# Patient Record
Sex: Male | Born: 1950 | Race: White | Hispanic: No | Marital: Single | Smoking: Never smoker
Health system: Southern US, Community
[De-identification: ages and names within clinical notes are randomized; demographics above are authoritative.]

## PROBLEM LIST (undated history)

## (undated) DIAGNOSIS — R011 Cardiac murmur, unspecified: Secondary | ICD-10-CM

## (undated) DIAGNOSIS — E43 Unspecified severe protein-calorie malnutrition: Secondary | ICD-10-CM

## (undated) DIAGNOSIS — C189 Malignant neoplasm of colon, unspecified: Secondary | ICD-10-CM

## (undated) DIAGNOSIS — D509 Iron deficiency anemia, unspecified: Secondary | ICD-10-CM

## (undated) DIAGNOSIS — Z933 Colostomy status: Secondary | ICD-10-CM

## (undated) DIAGNOSIS — G629 Polyneuropathy, unspecified: Secondary | ICD-10-CM

## (undated) HISTORY — PX: APPENDECTOMY: SHX54

## (undated) HISTORY — PX: OSTOMY: SHX5997

---

## 2009-07-22 ENCOUNTER — Ambulatory Visit: Payer: Self-pay | Admitting: Oncology

## 2009-08-09 ENCOUNTER — Encounter: Payer: Self-pay | Admitting: Gastroenterology

## 2009-08-09 ENCOUNTER — Inpatient Hospital Stay: Payer: Self-pay | Admitting: Unknown Physician Specialty

## 2009-08-10 ENCOUNTER — Encounter: Payer: Self-pay | Admitting: Gastroenterology

## 2009-08-11 ENCOUNTER — Encounter: Payer: Self-pay | Admitting: Gastroenterology

## 2009-08-11 ENCOUNTER — Telehealth (INDEPENDENT_AMBULATORY_CARE_PROVIDER_SITE_OTHER): Payer: Self-pay | Admitting: *Deleted

## 2009-08-11 DIAGNOSIS — R198 Other specified symptoms and signs involving the digestive system and abdomen: Secondary | ICD-10-CM | POA: Insufficient documentation

## 2009-08-14 ENCOUNTER — Ambulatory Visit: Payer: Self-pay | Admitting: Oncology

## 2009-08-17 ENCOUNTER — Ambulatory Visit: Payer: Self-pay | Admitting: Gastroenterology

## 2009-08-17 ENCOUNTER — Ambulatory Visit (HOSPITAL_COMMUNITY): Admission: RE | Admit: 2009-08-17 | Discharge: 2009-08-17 | Payer: Self-pay | Admitting: Gastroenterology

## 2009-08-22 ENCOUNTER — Ambulatory Visit: Payer: Self-pay | Admitting: Oncology

## 2009-08-25 ENCOUNTER — Ambulatory Visit: Payer: Self-pay | Admitting: Surgery

## 2009-09-19 ENCOUNTER — Ambulatory Visit: Payer: Self-pay | Admitting: Oncology

## 2009-10-20 ENCOUNTER — Ambulatory Visit: Payer: Self-pay | Admitting: Oncology

## 2009-11-08 ENCOUNTER — Ambulatory Visit: Payer: Self-pay | Admitting: Surgery

## 2009-11-20 ENCOUNTER — Inpatient Hospital Stay: Payer: Self-pay | Admitting: Surgery

## 2010-01-19 ENCOUNTER — Ambulatory Visit: Payer: Self-pay | Admitting: Internal Medicine

## 2010-02-16 ENCOUNTER — Ambulatory Visit: Payer: Self-pay | Admitting: Internal Medicine

## 2010-02-18 LAB — CEA: CEA: 2.1 ng/mL (ref 0.0–4.7)

## 2010-02-19 ENCOUNTER — Ambulatory Visit: Payer: Self-pay | Admitting: Internal Medicine

## 2010-03-22 ENCOUNTER — Ambulatory Visit: Payer: Self-pay | Admitting: Internal Medicine

## 2010-04-21 ENCOUNTER — Ambulatory Visit: Payer: Self-pay | Admitting: Internal Medicine

## 2010-05-22 ENCOUNTER — Ambulatory Visit: Payer: Self-pay | Admitting: Internal Medicine

## 2010-06-21 ENCOUNTER — Ambulatory Visit: Payer: Self-pay | Admitting: Internal Medicine

## 2010-07-22 ENCOUNTER — Ambulatory Visit: Payer: Self-pay | Admitting: Internal Medicine

## 2010-07-26 IMAGING — CR DG CHEST 1V PORT
1 series · 1 of 1 positions shown · non-contrast
Comparison: none

REASON FOR EXAM: port of cath
COMMENTS:

[view not recorded]
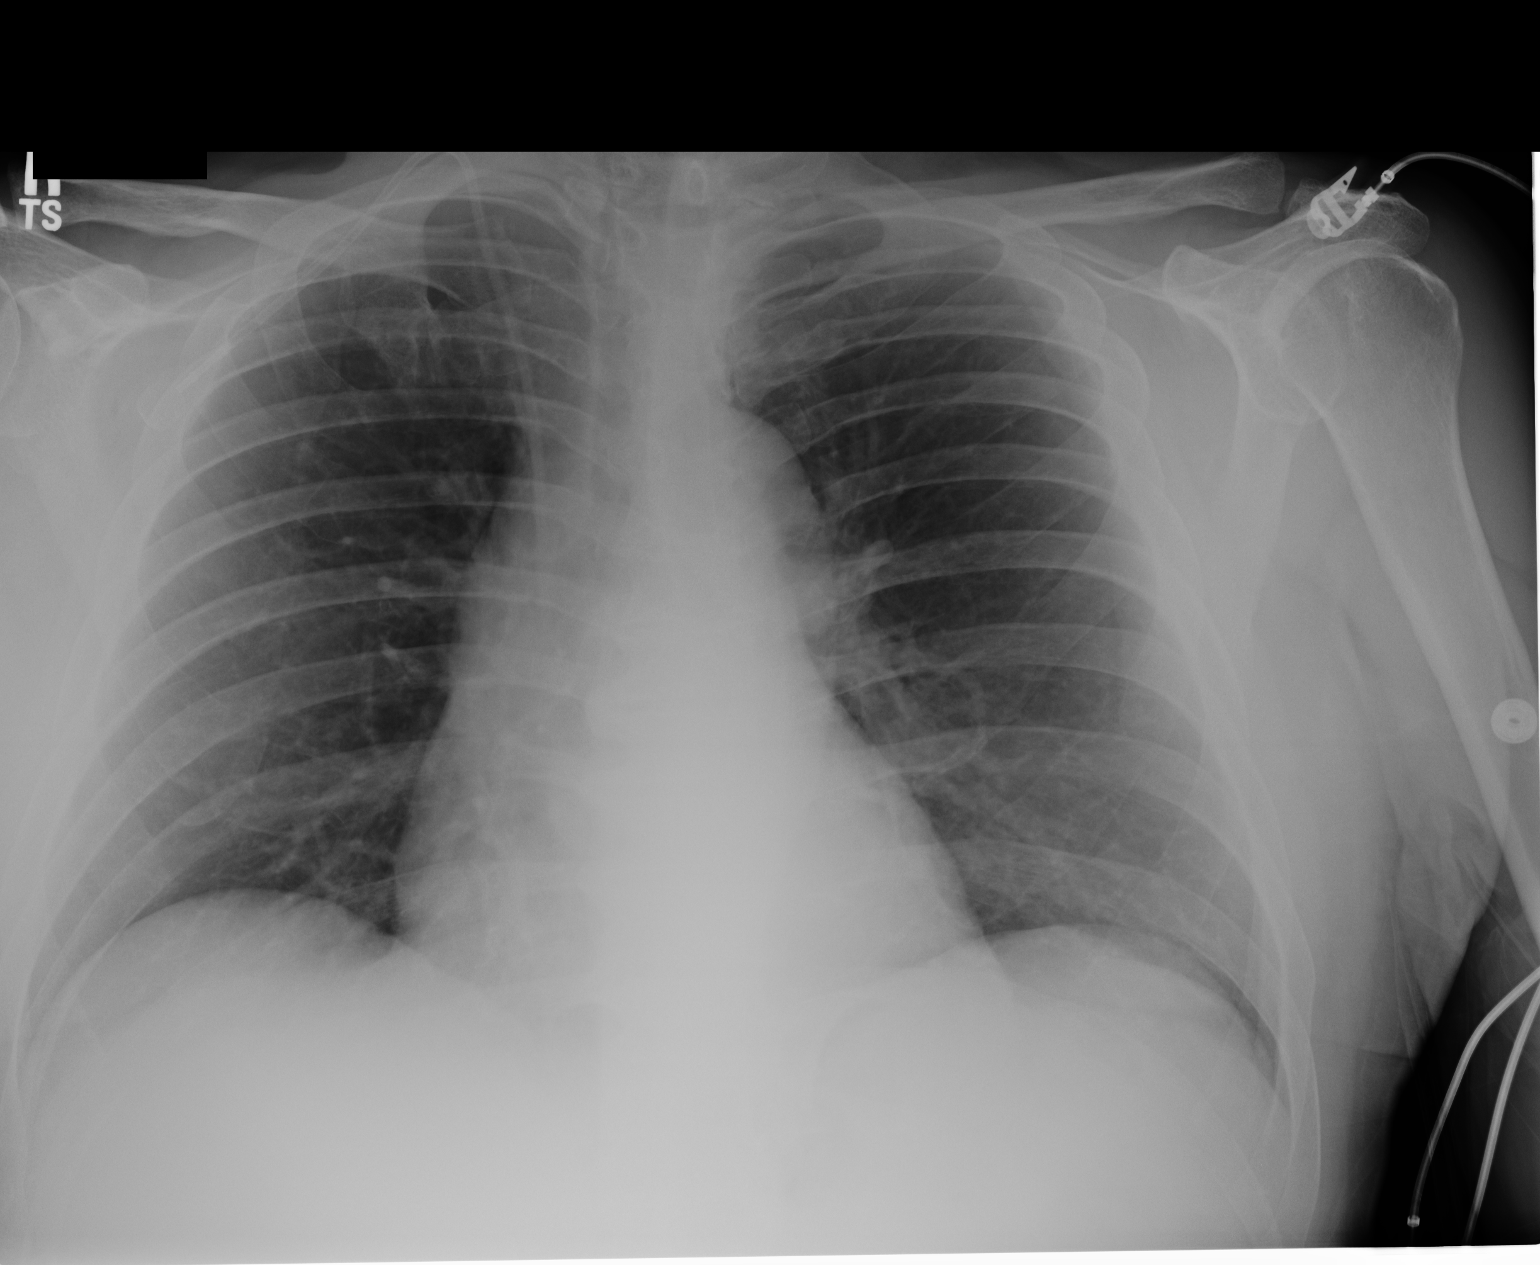

[1 of 1 positions shown; findings below may reference images not displayed]

PROCEDURE:     DXR - DXR PORTABLE CHEST SINGLE VIEW  - August 25, 2009 [DATE]

RESULT:     Comparison is made to the study of 08/09/2009.

A right central venous catheter is present with the tip in the superior vena
cava. There is no pneumothorax. The lungs are clear. The cardiac silhouette
is normal. Monitoring electrodes are present.
IMPRESSION: Right sided central venous catheter present with the tip in
the superior vena cava. No complication evident.

## 2010-08-22 ENCOUNTER — Ambulatory Visit: Payer: Self-pay | Admitting: Internal Medicine

## 2010-08-23 NOTE — Letter (Signed)
Summary: [facility]  [address] [place], [place] [address]   Phone: [phone]  Fax: [phone]       DAWSYN RAMSARAN    1950/10/24    MRN: [id number]        Procedure Day /Date:08/17/09     Arrival Time:1030 am     Procedure Time:1130 am     Location of Procedure:                     X  [facility] ( Outpatient Registration)    PREPARATION FOR FLEXIBLE SIGMOIDOSCOPY WITH MAGNESIUM CITRATE  Prior to the day before your procedure, purchase one 8 oz. bottle of Magnesium Citrate and one Fleet Enema from the laxative section of your drugstore.  _________________________________________________________________________________________________  THE DAY BEFORE YOUR PROCEDURE             DATE: 08/16/09     DAY: WED  1.   Have a clear liquid dinner the night before your procedure.  2.   Do not drink anything colored red or purple.  Avoid juices with pulp.  No orange juice.              CLEAR LIQUIDS INCLUDE: Water Jello Ice Popsicles Tea (sugar ok, no milk/cream) Powdered fruit flavored drinks Coffee (sugar ok, no milk/cream) Gatorade Juice: apple, white grape, white cranberry  Lemonade Clear bullion, consomm, broth Carbonated beverages (any kind) Strained chicken noodle soup Hard Candy   3.   At 7:00 pm the night before your procedure, drink one bottle of Magnesium Citrate over ice.  4.   Drink at least 3 more glasses of clear liquids before bedtime (preferably juices).  5.   Results are expected usually within 1 to 6 hours after taking the Magnesium Citrate.  ___________________________________________________________________________________________________  THE DAY OF YOUR PROCEDURE            DATE: 08/17/09     DAY: THUR  1.   Use Fleet Enema one hour prior to coming for procedure.  2.   You may drink clear liquids until 730 am (4 hours before exam)       MEDICATION INSTRUCTIONS  Unless otherwise  instructed, you should take regular prescription medications with a small sip of water as early as possible the morning of your procedure.         OTHER INSTRUCTIONS  You will need a responsible adult at least 60 years of age to accompany you and drive you home.   This person must remain in the waiting room during your procedure.  Wear loose fitting clothing that is easily removed.  Leave jewelry and other valuables at home.  However, you may wish to bring a book to read or an iPod/MP3 player to listen to music as you wait for your procedure to start.  Remove all body piercing jewelry and leave at home.  Total time from sign-in until discharge is approximately 2-3 hours.  You should go home directly after your procedure and rest.  You can resume normal activities the day after your procedure.  The day of your procedure you should not:   Drive   Make legal decisions   Operate machinery   Drink alcohol   Return to work  You will receive specific instructions about eating, activities and medications before you leave.   The above instructions have been reviewed and explained to me by   Chales Abrahams CMA Duncan Dull)  August 11, 2009  3:54 PM     I fully understand and can verbalize these instructions faxed to Dr Paula Compton office Herbert Seta will give to pt on Monday's appt.  Date 08/11/09

## 2010-08-23 NOTE — Letter (Signed)
Summary: Consult/[facility]  [facility]   Imported By: Sherian Rein 08/18/2009 07:47:42  _____________________________________________________________________  External Attachment:    Type:   Image     Comment:   External Document

## 2010-08-23 NOTE — Progress Notes (Signed)
Summary: EUS  Phone Note Outgoing Call Call back at [facility] Phone [phone]   Call placed by: Chales Abrahams CMA Duncan Dull),  August 11, 2009 3:48 PM Summary of Call: called and gave heather the appt date and time for pt she will give the instructions to the pt at his appt with dr gittin on monday.  I will fax the instructions to her. Initial call taken by: Chales Abrahams CMA Duncan Dull),  August 11, 2009 3:52 PM  New Problems: RECTAL MASS (ICD-787.99)   New Problems: RECTAL MASS (ICD-787.99)

## 2010-08-23 NOTE — Procedures (Signed)
Summary: Colonoscopy/[facility]  [facility]   Imported By: Sherian Rein 08/18/2009 07:50:55  _____________________________________________________________________  External Attachment:    Type:   Image     Comment:   External Document

## 2010-08-23 NOTE — Procedures (Signed)
Summary: Endoscopic Ultrasound  Patient: Octavia Mottola Note: All result statuses are Final unless otherwise noted.  Tests: (1) Endoscopic Ultrasound (EUS)  EUS Endoscopic Ultrasound                             DONE     [facility]     [address] [place], [place]  [address]           ENDOSCOPIC ULTRASOUND PROCEDURE REPORT           PATIENT:  Elizandro, Laura  MR#:  [id number]     BIRTHDATE:  [birth year]  GENDER:  male           ENDOSCOPIST:  Rachael Fee, MD     Referred by:  [name], MD at [facility]           PROCEDURE DATE:  08/17/2009     PROCEDURE:  Lower EUS     ASA CLASS:  Class II     INDICATIONS:  newly diagnosed rectal adenocarcinoma (colonoscopy     by Dr. Lynnae Prude).  Imaging shows no clear metastatic disease                 MEDICATIONS:   Fentanyl 50 mcg IV, Versed 5 mg IV           DESCRIPTION OF PROCEDURE:   After the risks, benefits, and     alternatives of the procedure were thoroughly explained, informed     consent was obtained.  The  endoscope was introduced through the     and advanced to the .     <<PROCEDUREIMAGES>>           Sigmoidoscopic findings:     1. Circumferential, clearly malignant, ulcerted, fungating,     friable mass in rectum. The mass is 6cm long and the distal edge     of the mass is 5-6cm from anal verge.           EUS findings:     1. The mass described above corresponds with an irregularly     bordered, heterogneous, hypoechoic lesion that is 1.3cm thick     maximally and clearly passes into and through the muscularis     propria layer of the rectal wall (uT3).     2. There were two small, but round, hypoechoic, homogeneous,     discrete perirectal lymphnodes that are suspicious for malignant     involvement (uN1).           Impression:     6cm long, circumferential uT3N1 rectal adenocarcinoma with distal     edge 5-6cm from anal verge.  I spoke with Dr. [name] about these  finding, we agree that the patient will likely benefit from     neoadjuvant chemo/xrt.     I will leave follow up (post treatment) surveillance colonoscopies     to Dr. Lynnae Prude who initially diagnosed the rectal cancer     earlier this month.           ______________________________     Rachael Fee, MD           cc: Gerarda Fraction, MD; Lynnae Prude, MD           n.     eSIGNED:   Rachael Fee at 08/17/2009 11:44 AM  Tykel, Badie, [id number]  Note: An exclamation mark (!) indicates a result that was not dispersed into the flowsheet. Document Creation Date: 08/17/2009 11:45 AM _______________________________________________________________________  (1) Order result status: Final Collection or observation date-time: 08/17/2009 11:33 Requested date-time:  Receipt date-time:  Reported date-time:  Referring Physician:   Ordering Physician: Rob Bunting [phone]) Specimen Source:  Source: Launa Grill Order Number: [id number] Lab site:

## 2010-08-23 NOTE — Letter (Signed)
Summary: Physician [facility]  [facility]   Imported By: Sherian Rein 08/18/2009 07:45:52  _____________________________________________________________________  External Attachment:    Type:   Image     Comment:   External Document

## 2010-08-23 NOTE — Letter (Signed)
Summary: Brief Consult/[facility]  [facility]   Imported By: Sherian Rein 08/18/2009 07:49:58  _____________________________________________________________________  External Attachment:    Type:   Image     Comment:   External Document

## 2010-09-20 ENCOUNTER — Ambulatory Visit: Payer: Self-pay | Admitting: Internal Medicine

## 2010-10-12 LAB — CEA: CEA: 2.8 ng/mL (ref 0.0–4.7)

## 2010-10-21 ENCOUNTER — Ambulatory Visit: Payer: Self-pay | Admitting: Internal Medicine

## 2011-11-03 IMAGING — CR DG CHEST 2V
1 series · 2 of 2 positions shown · non-contrast
Comparison: none

REASON FOR EXAM: rectal mass, severe anemia, b12 def., iron def., rectal
bleeding
COMMENTS:

[Series 1: view not recorded · 0.17mm/px · 2 of 2 slices shown]
[im 1/2]
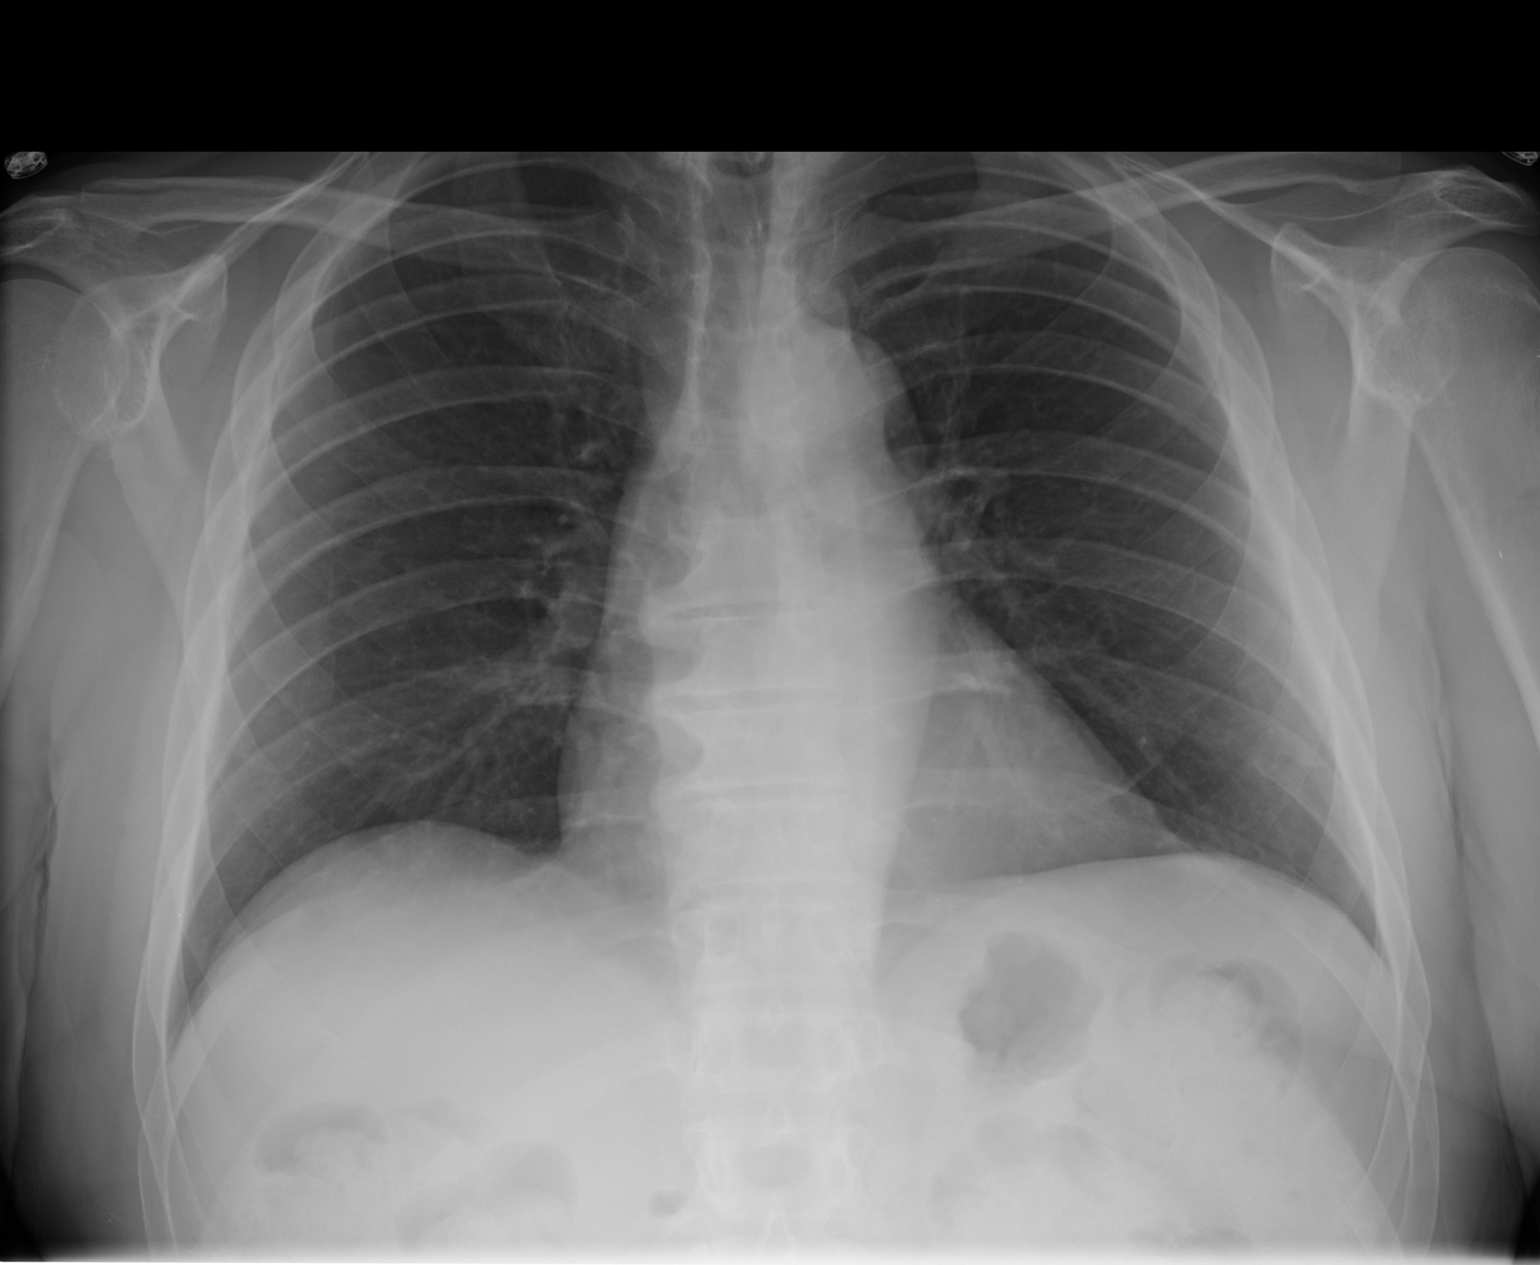
[im 2/2]
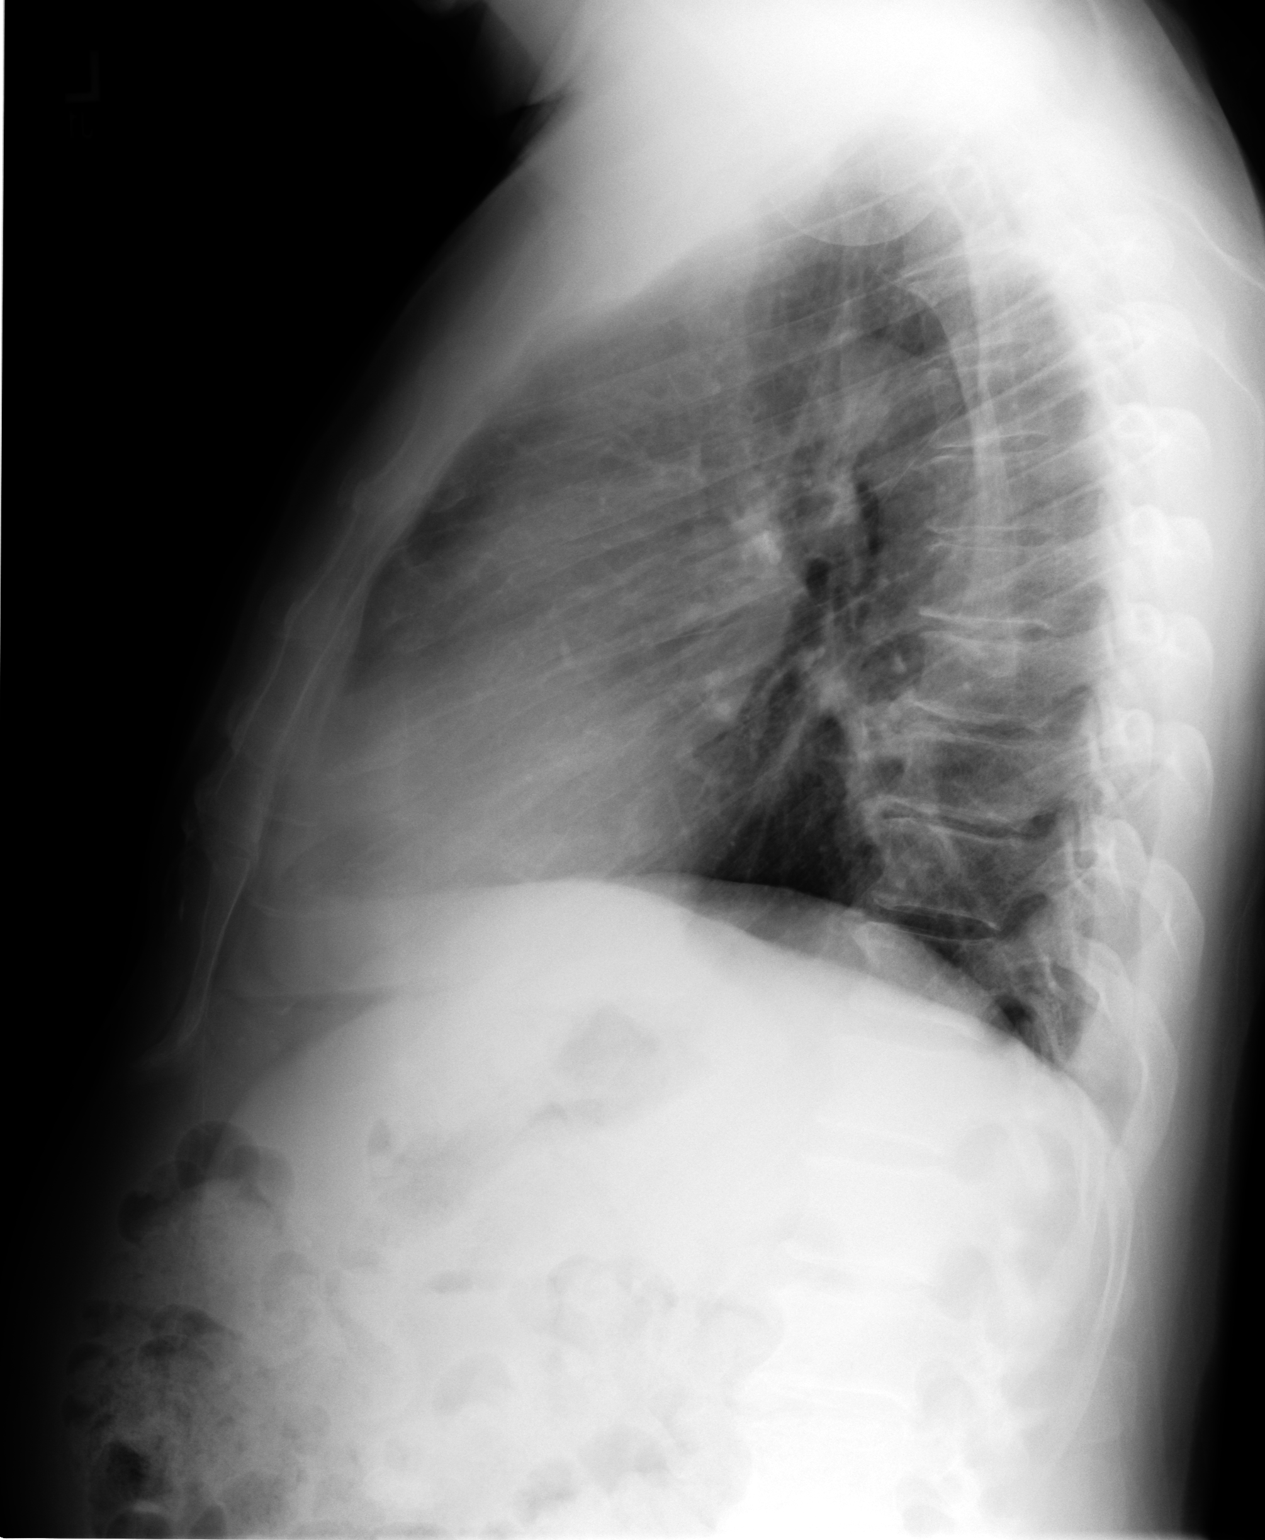

[2 of 2 positions shown; findings below may reference images not displayed]

PROCEDURE:     DXR - DXR CHEST PA (OR AP) AND LATERAL  - August 09, 2009  [DATE]

RESULT:     The lungs are adequately inflated and clear. The heart is normal
in size. The pulmonary vascularity is not engorged. There is mild tortuosity
of the descending thoracic aorta. There are degenerative changes of the
thoracic spine.
IMPRESSION: I do not see evidence of metastatic disease nor other acute
cardiopulmonary abnormality.

## 2014-09-05 ENCOUNTER — Ambulatory Visit: Payer: Self-pay | Admitting: Unknown Physician Specialty

## 2014-11-14 LAB — SURGICAL PATHOLOGY

## 2015-09-04 DIAGNOSIS — Z433 Encounter for attention to colostomy: Secondary | ICD-10-CM | POA: Diagnosis not present

## 2015-11-02 DIAGNOSIS — Z433 Encounter for attention to colostomy: Secondary | ICD-10-CM | POA: Diagnosis not present

## 2015-12-22 DIAGNOSIS — Z433 Encounter for attention to colostomy: Secondary | ICD-10-CM | POA: Diagnosis not present

## 2015-12-31 DIAGNOSIS — B029 Zoster without complications: Secondary | ICD-10-CM | POA: Diagnosis not present

## 2016-02-12 DIAGNOSIS — Z433 Encounter for attention to colostomy: Secondary | ICD-10-CM | POA: Diagnosis not present

## 2016-05-07 DIAGNOSIS — Z433 Encounter for attention to colostomy: Secondary | ICD-10-CM | POA: Diagnosis not present

## 2016-08-22 DIAGNOSIS — Z433 Encounter for attention to colostomy: Secondary | ICD-10-CM | POA: Diagnosis not present

## 2016-10-22 DIAGNOSIS — Z433 Encounter for attention to colostomy: Secondary | ICD-10-CM | POA: Diagnosis not present

## 2017-01-20 DIAGNOSIS — Z433 Encounter for attention to colostomy: Secondary | ICD-10-CM | POA: Diagnosis not present

## 2017-02-07 DIAGNOSIS — Z433 Encounter for attention to colostomy: Secondary | ICD-10-CM | POA: Diagnosis not present

## 2017-03-20 DIAGNOSIS — Z433 Encounter for attention to colostomy: Secondary | ICD-10-CM | POA: Diagnosis not present

## 2017-08-05 DIAGNOSIS — Z433 Encounter for attention to colostomy: Secondary | ICD-10-CM | POA: Diagnosis not present

## 2017-10-20 DIAGNOSIS — R112 Nausea with vomiting, unspecified: Secondary | ICD-10-CM | POA: Diagnosis not present

## 2017-10-20 DIAGNOSIS — R6889 Other general symptoms and signs: Secondary | ICD-10-CM | POA: Diagnosis not present

## 2017-10-24 DIAGNOSIS — Z433 Encounter for attention to colostomy: Secondary | ICD-10-CM | POA: Diagnosis not present

## 2018-01-08 DIAGNOSIS — Z433 Encounter for attention to colostomy: Secondary | ICD-10-CM | POA: Diagnosis not present

## 2018-01-23 DIAGNOSIS — Z433 Encounter for attention to colostomy: Secondary | ICD-10-CM | POA: Diagnosis not present

## 2018-06-29 DIAGNOSIS — Z433 Encounter for attention to colostomy: Secondary | ICD-10-CM | POA: Diagnosis not present

## 2018-07-07 DIAGNOSIS — J0141 Acute recurrent pansinusitis: Secondary | ICD-10-CM | POA: Diagnosis not present

## 2018-08-03 DIAGNOSIS — J0141 Acute recurrent pansinusitis: Secondary | ICD-10-CM | POA: Diagnosis not present

## 2018-09-03 DIAGNOSIS — Z433 Encounter for attention to colostomy: Secondary | ICD-10-CM | POA: Diagnosis not present

## 2018-09-03 DIAGNOSIS — J0100 Acute maxillary sinusitis, unspecified: Secondary | ICD-10-CM | POA: Diagnosis not present

## 2018-09-03 DIAGNOSIS — J301 Allergic rhinitis due to pollen: Secondary | ICD-10-CM | POA: Diagnosis not present

## 2018-09-22 DIAGNOSIS — J301 Allergic rhinitis due to pollen: Secondary | ICD-10-CM | POA: Diagnosis not present

## 2018-09-22 DIAGNOSIS — J329 Chronic sinusitis, unspecified: Secondary | ICD-10-CM | POA: Diagnosis not present

## 2018-10-16 DIAGNOSIS — Z433 Encounter for attention to colostomy: Secondary | ICD-10-CM | POA: Diagnosis not present

## 2019-01-01 DIAGNOSIS — Z433 Encounter for attention to colostomy: Secondary | ICD-10-CM | POA: Diagnosis not present

## 2019-02-15 DIAGNOSIS — Z433 Encounter for attention to colostomy: Secondary | ICD-10-CM | POA: Diagnosis not present

## 2019-02-18 DIAGNOSIS — H9211 Otorrhea, right ear: Secondary | ICD-10-CM | POA: Diagnosis not present

## 2019-03-02 DIAGNOSIS — Z433 Encounter for attention to colostomy: Secondary | ICD-10-CM | POA: Diagnosis not present

## 2019-07-01 DIAGNOSIS — Z433 Encounter for attention to colostomy: Secondary | ICD-10-CM | POA: Diagnosis not present

## 2019-08-19 DIAGNOSIS — Z433 Encounter for attention to colostomy: Secondary | ICD-10-CM | POA: Diagnosis not present

## 2019-10-01 DIAGNOSIS — Z933 Colostomy status: Secondary | ICD-10-CM | POA: Diagnosis not present

## 2019-10-22 DIAGNOSIS — Z433 Encounter for attention to colostomy: Secondary | ICD-10-CM | POA: Diagnosis not present

## 2020-01-07 DIAGNOSIS — Z433 Encounter for attention to colostomy: Secondary | ICD-10-CM | POA: Diagnosis not present

## 2020-02-24 DIAGNOSIS — Z433 Encounter for attention to colostomy: Secondary | ICD-10-CM | POA: Diagnosis not present

## 2020-04-05 DIAGNOSIS — Z433 Encounter for attention to colostomy: Secondary | ICD-10-CM | POA: Diagnosis not present

## 2020-04-25 DIAGNOSIS — Z433 Encounter for attention to colostomy: Secondary | ICD-10-CM | POA: Diagnosis not present

## 2020-06-21 DIAGNOSIS — Z433 Encounter for attention to colostomy: Secondary | ICD-10-CM | POA: Diagnosis not present

## 2020-08-01 DIAGNOSIS — J342 Deviated nasal septum: Secondary | ICD-10-CM | POA: Diagnosis not present

## 2020-08-01 DIAGNOSIS — J301 Allergic rhinitis due to pollen: Secondary | ICD-10-CM | POA: Diagnosis not present

## 2020-08-18 DIAGNOSIS — Z433 Encounter for attention to colostomy: Secondary | ICD-10-CM | POA: Diagnosis not present

## 2020-09-14 DIAGNOSIS — Z433 Encounter for attention to colostomy: Secondary | ICD-10-CM | POA: Diagnosis not present

## 2020-10-09 DIAGNOSIS — Z433 Encounter for attention to colostomy: Secondary | ICD-10-CM | POA: Diagnosis not present

## 2020-10-23 DIAGNOSIS — S46011A Strain of muscle(s) and tendon(s) of the rotator cuff of right shoulder, initial encounter: Secondary | ICD-10-CM | POA: Diagnosis not present

## 2020-10-23 DIAGNOSIS — M1711 Unilateral primary osteoarthritis, right knee: Secondary | ICD-10-CM | POA: Diagnosis not present

## 2020-11-06 DIAGNOSIS — M1711 Unilateral primary osteoarthritis, right knee: Secondary | ICD-10-CM | POA: Diagnosis not present

## 2020-11-17 DIAGNOSIS — Z433 Encounter for attention to colostomy: Secondary | ICD-10-CM | POA: Diagnosis not present

## 2020-12-27 DIAGNOSIS — Z433 Encounter for attention to colostomy: Secondary | ICD-10-CM | POA: Diagnosis not present

## 2021-01-30 DIAGNOSIS — U071 COVID-19: Secondary | ICD-10-CM | POA: Diagnosis not present

## 2021-01-30 DIAGNOSIS — R112 Nausea with vomiting, unspecified: Secondary | ICD-10-CM | POA: Diagnosis not present

## 2021-02-01 DIAGNOSIS — Z433 Encounter for attention to colostomy: Secondary | ICD-10-CM | POA: Diagnosis not present

## 2021-02-21 DIAGNOSIS — Z433 Encounter for attention to colostomy: Secondary | ICD-10-CM | POA: Diagnosis not present

## 2021-03-16 DIAGNOSIS — Z23 Encounter for immunization: Secondary | ICD-10-CM | POA: Diagnosis not present

## 2021-03-16 DIAGNOSIS — Z8679 Personal history of other diseases of the circulatory system: Secondary | ICD-10-CM | POA: Diagnosis not present

## 2021-03-16 DIAGNOSIS — Z125 Encounter for screening for malignant neoplasm of prostate: Secondary | ICD-10-CM | POA: Diagnosis not present

## 2021-03-16 DIAGNOSIS — N529 Male erectile dysfunction, unspecified: Secondary | ICD-10-CM | POA: Diagnosis not present

## 2021-03-16 DIAGNOSIS — Z85048 Personal history of other malignant neoplasm of rectum, rectosigmoid junction, and anus: Secondary | ICD-10-CM | POA: Diagnosis not present

## 2021-03-16 DIAGNOSIS — Z13220 Encounter for screening for lipoid disorders: Secondary | ICD-10-CM | POA: Diagnosis not present

## 2021-03-16 DIAGNOSIS — Z0000 Encounter for general adult medical examination without abnormal findings: Secondary | ICD-10-CM | POA: Diagnosis not present

## 2021-03-16 DIAGNOSIS — Z933 Colostomy status: Secondary | ICD-10-CM | POA: Diagnosis not present

## 2021-03-21 DIAGNOSIS — Z13220 Encounter for screening for lipoid disorders: Secondary | ICD-10-CM | POA: Diagnosis not present

## 2021-03-21 DIAGNOSIS — Z125 Encounter for screening for malignant neoplasm of prostate: Secondary | ICD-10-CM | POA: Diagnosis not present

## 2021-03-21 DIAGNOSIS — Z85048 Personal history of other malignant neoplasm of rectum, rectosigmoid junction, and anus: Secondary | ICD-10-CM | POA: Diagnosis not present

## 2021-03-28 DIAGNOSIS — M19011 Primary osteoarthritis, right shoulder: Secondary | ICD-10-CM | POA: Diagnosis not present

## 2021-03-28 DIAGNOSIS — Z433 Encounter for attention to colostomy: Secondary | ICD-10-CM | POA: Diagnosis not present

## 2021-05-04 DIAGNOSIS — M79675 Pain in left toe(s): Secondary | ICD-10-CM | POA: Diagnosis not present

## 2021-05-04 DIAGNOSIS — Z433 Encounter for attention to colostomy: Secondary | ICD-10-CM | POA: Diagnosis not present

## 2021-05-22 DIAGNOSIS — M21612 Bunion of left foot: Secondary | ICD-10-CM | POA: Diagnosis not present

## 2021-07-10 DIAGNOSIS — Z433 Encounter for attention to colostomy: Secondary | ICD-10-CM | POA: Diagnosis not present

## 2021-08-03 DIAGNOSIS — J329 Chronic sinusitis, unspecified: Secondary | ICD-10-CM | POA: Diagnosis not present

## 2021-08-03 DIAGNOSIS — R059 Cough, unspecified: Secondary | ICD-10-CM | POA: Diagnosis not present

## 2021-08-24 DIAGNOSIS — S46011A Strain of muscle(s) and tendon(s) of the rotator cuff of right shoulder, initial encounter: Secondary | ICD-10-CM | POA: Diagnosis not present

## 2021-09-12 DIAGNOSIS — Z433 Encounter for attention to colostomy: Secondary | ICD-10-CM | POA: Diagnosis not present

## 2021-10-18 DIAGNOSIS — Z433 Encounter for attention to colostomy: Secondary | ICD-10-CM | POA: Diagnosis not present

## 2021-11-21 DIAGNOSIS — S46011A Strain of muscle(s) and tendon(s) of the rotator cuff of right shoulder, initial encounter: Secondary | ICD-10-CM | POA: Diagnosis not present

## 2022-01-02 DIAGNOSIS — Z433 Encounter for attention to colostomy: Secondary | ICD-10-CM | POA: Diagnosis not present

## 2022-02-21 DIAGNOSIS — Z933 Colostomy status: Secondary | ICD-10-CM | POA: Diagnosis not present

## 2022-02-22 DIAGNOSIS — S46011A Strain of muscle(s) and tendon(s) of the rotator cuff of right shoulder, initial encounter: Secondary | ICD-10-CM | POA: Diagnosis not present

## 2022-03-14 DIAGNOSIS — G63 Polyneuropathy in diseases classified elsewhere: Secondary | ICD-10-CM | POA: Diagnosis not present

## 2022-03-14 DIAGNOSIS — E663 Overweight: Secondary | ICD-10-CM | POA: Diagnosis not present

## 2022-03-14 DIAGNOSIS — Z933 Colostomy status: Secondary | ICD-10-CM | POA: Diagnosis not present

## 2022-03-14 DIAGNOSIS — J309 Allergic rhinitis, unspecified: Secondary | ICD-10-CM | POA: Diagnosis not present

## 2022-03-14 DIAGNOSIS — G8929 Other chronic pain: Secondary | ICD-10-CM | POA: Diagnosis not present

## 2022-03-23 DIAGNOSIS — Z933 Colostomy status: Secondary | ICD-10-CM | POA: Diagnosis not present

## 2022-04-10 DIAGNOSIS — M1711 Unilateral primary osteoarthritis, right knee: Secondary | ICD-10-CM | POA: Diagnosis not present

## 2022-05-16 DIAGNOSIS — Z933 Colostomy status: Secondary | ICD-10-CM | POA: Diagnosis not present

## 2022-06-12 DIAGNOSIS — Z933 Colostomy status: Secondary | ICD-10-CM | POA: Diagnosis not present

## 2022-06-28 ENCOUNTER — Encounter: Payer: Self-pay | Admitting: Emergency Medicine

## 2022-06-28 ENCOUNTER — Emergency Department: Payer: PPO

## 2022-06-28 DIAGNOSIS — Z933 Colostomy status: Secondary | ICD-10-CM | POA: Diagnosis not present

## 2022-06-28 DIAGNOSIS — R1084 Generalized abdominal pain: Secondary | ICD-10-CM | POA: Diagnosis not present

## 2022-06-28 DIAGNOSIS — G629 Polyneuropathy, unspecified: Secondary | ICD-10-CM | POA: Diagnosis present

## 2022-06-28 DIAGNOSIS — L259 Unspecified contact dermatitis, unspecified cause: Secondary | ICD-10-CM | POA: Diagnosis present

## 2022-06-28 DIAGNOSIS — C184 Malignant neoplasm of transverse colon: Secondary | ICD-10-CM | POA: Diagnosis not present

## 2022-06-28 DIAGNOSIS — I700 Atherosclerosis of aorta: Secondary | ICD-10-CM | POA: Diagnosis present

## 2022-06-28 DIAGNOSIS — I10 Essential (primary) hypertension: Secondary | ICD-10-CM | POA: Diagnosis not present

## 2022-06-28 DIAGNOSIS — K8020 Calculus of gallbladder without cholecystitis without obstruction: Secondary | ICD-10-CM | POA: Diagnosis present

## 2022-06-28 DIAGNOSIS — C189 Malignant neoplasm of colon, unspecified: Secondary | ICD-10-CM | POA: Diagnosis present

## 2022-06-28 DIAGNOSIS — Z03818 Encounter for observation for suspected exposure to other biological agents ruled out: Secondary | ICD-10-CM | POA: Diagnosis not present

## 2022-06-28 DIAGNOSIS — R5383 Other fatigue: Secondary | ICD-10-CM | POA: Diagnosis not present

## 2022-06-28 DIAGNOSIS — K56609 Unspecified intestinal obstruction, unspecified as to partial versus complete obstruction: Secondary | ICD-10-CM | POA: Diagnosis present

## 2022-06-28 DIAGNOSIS — N281 Cyst of kidney, acquired: Secondary | ICD-10-CM | POA: Diagnosis not present

## 2022-06-28 DIAGNOSIS — K5732 Diverticulitis of large intestine without perforation or abscess without bleeding: Secondary | ICD-10-CM | POA: Diagnosis not present

## 2022-06-28 DIAGNOSIS — K6389 Other specified diseases of intestine: Secondary | ICD-10-CM | POA: Diagnosis not present

## 2022-06-28 DIAGNOSIS — C186 Malignant neoplasm of descending colon: Secondary | ICD-10-CM | POA: Diagnosis not present

## 2022-06-28 DIAGNOSIS — R634 Abnormal weight loss: Secondary | ICD-10-CM | POA: Diagnosis not present

## 2022-06-28 DIAGNOSIS — K435 Parastomal hernia without obstruction or  gangrene: Secondary | ICD-10-CM | POA: Diagnosis not present

## 2022-06-28 LAB — URINALYSIS, ROUTINE W REFLEX MICROSCOPIC
Bacteria, UA: NONE SEEN
Bilirubin Urine: NEGATIVE
Glucose, UA: NEGATIVE mg/dL
Ketones, ur: NEGATIVE mg/dL
Leukocytes,Ua: NEGATIVE
Nitrite: NEGATIVE
Protein, ur: 100 mg/dL — AB
Specific Gravity, Urine: >1.046 — ABNORMAL HIGH (ref 1.005–1.030)
Squamous Epithelial / LPF: NONE SEEN (ref 0–5)
pH: 5 (ref 5.0–8.0)

## 2022-06-28 LAB — COMPREHENSIVE METABOLIC PANEL
ALT: 11 U/L (ref 0–44)
AST: 18 U/L (ref 15–41)
Albumin: 4.1 g/dL (ref 3.5–5.0)
Alkaline Phosphatase: 82 U/L (ref 38–126)
Anion gap: 6 (ref 5–15)
BUN: 12 mg/dL (ref 8–23)
CO2: 25 mmol/L (ref 22–32)
Calcium: 9.4 mg/dL (ref 8.9–10.3)
Chloride: 108 mmol/L (ref 98–111)
Creatinine, Ser: 1.12 mg/dL (ref 0.61–1.24)
GFR, Estimated: >60 mL/min (ref >60)
Glucose, Bld: 107 mg/dL — ABNORMAL HIGH (ref 70–99)
Potassium: 3.5 mmol/L (ref 3.5–5.1)
Sodium: 139 mmol/L (ref 135–145)
Total Bilirubin: 0.6 mg/dL (ref 0.3–1.2)
Total Protein: 7.7 g/dL (ref 6.5–8.1)

## 2022-06-28 LAB — CBC
HCT: 38.6 % — ABNORMAL LOW (ref 39.0–52.0)
Hemoglobin: 12.2 g/dL — ABNORMAL LOW (ref 13.0–17.0)
MCH: 25.1 pg — ABNORMAL LOW (ref 26.0–34.0)
MCHC: 31.6 g/dL (ref 30.0–36.0)
MCV: 79.3 fL — ABNORMAL LOW (ref 80.0–100.0)
Platelets: 295 10*3/uL (ref 150–400)
RBC: 4.87 MIL/uL (ref 4.22–5.81)
RDW: 16.1 % — ABNORMAL HIGH (ref 11.5–15.5)
WBC: 13.5 10*3/uL — ABNORMAL HIGH (ref 4.0–10.5)
nRBC: 0 % (ref 0.0–0.2)

## 2022-06-28 LAB — LIPASE, BLOOD: Lipase: 43 U/L (ref 11–51)

## 2022-06-28 MED ORDER — IOHEXOL 300 MG/ML  SOLN
100.0000 mL | Freq: Once | INTRAMUSCULAR | Status: AC | PRN
  Administered 2022-06-28: 100 mL via INTRAVENOUS

## 2022-06-28 MED ORDER — ONDANSETRON 4 MG PO TBDP
4.0000 mg | ORAL_TABLET | Freq: Once | ORAL | Status: AC | PRN
  Administered 2022-06-28: 4 mg via ORAL
  Filled 2022-06-28: qty 1

## 2022-06-28 NOTE — ED Triage Notes (Signed)
Pt presents via POV with complaints of abdominal pain with associated N/V/D for the last 2 weeks. He states having a low K+ level at the UC today - 2.9. Denies CP or SOB.

## 2022-06-28 NOTE — ED Provider Triage Note (Signed)
Emergency Medicine Provider Triage Evaluation Note  [name], a 71 y.o. male  was evaluated in triage.  Pt complains of ongoing nausea, vomiting, and diarrhea intermittently for the last 2 weeks.  Patient describes [name] normal discomfort.  He was evaluated at local urgent care today, found to have a decreased potassium.  Review of Systems  Positive: Low K+, NVD Negative: FCS  Physical Exam  BP (!) 175/107 (BP Location: Left Arm)   Pulse 71   Temp 98.5 F (36.9 C) (Oral)   Resp 18   Ht 5' 8.5" (1.74 m)   Wt 73.9 kg   SpO2 98%   BMI 24.42 kg/m  Gen:   Awake, no distress  NAD Resp:  Normal effort CTA MSK:   Moves extremities without difficulty  Other:    Medical Decision Making  Medically screening exam initiated at 7:57 PM.  Appropriate orders placed.  [name] was informed that the remainder of the evaluation will be completed by another provider, this initial triage assessment does not replace that evaluation, and the importance of remaining in the ED until their evaluation is complete.  Geriatric patient to the ED for evaluation of episodic nausea, vomiting, diarrhea over the last 2 weeks.  He was found to have a critical low potassium as he reported to a local urgent care today.   Nathan Needles, PA-C 06/28/22 2000

## 2022-06-29 ENCOUNTER — Encounter: Payer: Self-pay | Admitting: Surgery

## 2022-06-29 ENCOUNTER — Inpatient Hospital Stay
Admission: EM | Admit: 2022-06-29 | Discharge: 2022-07-09 | DRG: 331 | Disposition: A | Discharge status: Home / Self Care | Payer: PPO | Attending: Surgery | Admitting: Surgery

## 2022-06-29 ENCOUNTER — Encounter
Admission: EM | Disposition: A | Discharge status: Home / Self Care | Payer: Self-pay | Source: Home / Self Care | Attending: Surgery

## 2022-06-29 ENCOUNTER — Inpatient Hospital Stay: Payer: PPO | Admitting: Registered Nurse

## 2022-06-29 ENCOUNTER — Other Ambulatory Visit: Payer: Self-pay

## 2022-06-29 ENCOUNTER — Inpatient Hospital Stay: Payer: PPO

## 2022-06-29 DIAGNOSIS — K56609 Unspecified intestinal obstruction, unspecified as to partial versus complete obstruction: Principal | ICD-10-CM

## 2022-06-29 DIAGNOSIS — I700 Atherosclerosis of aorta: Secondary | ICD-10-CM | POA: Diagnosis present

## 2022-06-29 DIAGNOSIS — K6389 Other specified diseases of intestine: Secondary | ICD-10-CM | POA: Diagnosis present

## 2022-06-29 DIAGNOSIS — L259 Unspecified contact dermatitis, unspecified cause: Secondary | ICD-10-CM | POA: Diagnosis present

## 2022-06-29 DIAGNOSIS — G629 Polyneuropathy, unspecified: Secondary | ICD-10-CM | POA: Diagnosis present

## 2022-06-29 DIAGNOSIS — E43 Unspecified severe protein-calorie malnutrition: Secondary | ICD-10-CM | POA: Insufficient documentation

## 2022-06-29 DIAGNOSIS — K8020 Calculus of gallbladder without cholecystitis without obstruction: Secondary | ICD-10-CM | POA: Diagnosis present

## 2022-06-29 DIAGNOSIS — Z933 Colostomy status: Secondary | ICD-10-CM | POA: Diagnosis not present

## 2022-06-29 DIAGNOSIS — C189 Malignant neoplasm of colon, unspecified: Secondary | ICD-10-CM | POA: Diagnosis present

## 2022-06-29 HISTORY — DX: Malignant neoplasm of colon, unspecified: C18.9

## 2022-06-29 HISTORY — DX: Polyneuropathy, unspecified: G62.9

## 2022-06-29 HISTORY — PX: COLONOSCOPY: SHX5424

## 2022-06-29 LAB — PROTIME-INR
INR: 1.2 (ref 0.8–1.2)
Prothrombin Time: 15.3 seconds — ABNORMAL HIGH (ref 11.4–15.2)

## 2022-06-29 LAB — CBC
HCT: 35.1 % — ABNORMAL LOW (ref 39.0–52.0)
Hemoglobin: 11 g/dL — ABNORMAL LOW (ref 13.0–17.0)
MCH: 25.2 pg — ABNORMAL LOW (ref 26.0–34.0)
MCHC: 31.3 g/dL (ref 30.0–36.0)
MCV: 80.5 fL (ref 80.0–100.0)
Platelets: 244 10*3/uL (ref 150–400)
RBC: 4.36 MIL/uL (ref 4.22–5.81)
RDW: 16.3 % — ABNORMAL HIGH (ref 11.5–15.5)
WBC: 9.5 10*3/uL (ref 4.0–10.5)
nRBC: 0 % (ref 0.0–0.2)

## 2022-06-29 LAB — BASIC METABOLIC PANEL
Anion gap: 5 (ref 5–15)
BUN: 10 mg/dL (ref 8–23)
CO2: 25 mmol/L (ref 22–32)
Calcium: 8.6 mg/dL — ABNORMAL LOW (ref 8.9–10.3)
Chloride: 107 mmol/L (ref 98–111)
Creatinine, Ser: 0.99 mg/dL (ref 0.61–1.24)
GFR, Estimated: >60 mL/min (ref >60)
Glucose, Bld: 99 mg/dL (ref 70–99)
Potassium: 3.1 mmol/L — ABNORMAL LOW (ref 3.5–5.1)
Sodium: 137 mmol/L (ref 135–145)

## 2022-06-29 SURGERY — COLONOSCOPY
Anesthesia: General

## 2022-06-29 MED ORDER — VASOPRESSIN 20 UNIT/ML IV SOLN
INTRAVENOUS | Status: AC
  Filled 2022-06-29: qty 1

## 2022-06-29 MED ORDER — FENTANYL CITRATE (PF) 100 MCG/2ML IJ SOLN
INTRAMUSCULAR | Status: DC | PRN
  Administered 2022-06-29: 25 ug via INTRAVENOUS

## 2022-06-29 MED ORDER — EPHEDRINE SULFATE (PRESSORS) 50 MG/ML IJ SOLN
INTRAMUSCULAR | Status: DC | PRN
  Administered 2022-06-29: 5 mg via INTRAVENOUS
  Administered 2022-06-29: 10 mg via INTRAVENOUS
  Administered 2022-06-29 (×2): 5 mg via INTRAVENOUS

## 2022-06-29 MED ORDER — VASOPRESSIN 20 UNIT/ML IV SOLN
INTRAVENOUS | Status: DC | PRN
  Administered 2022-06-29: 1 [IU] via INTRAVENOUS

## 2022-06-29 MED ORDER — SODIUM CHLORIDE 0.9 % IV SOLN: INTRAVENOUS | Status: DC

## 2022-06-29 MED ORDER — ONDANSETRON HCL 4 MG/2ML IJ SOLN
INTRAMUSCULAR | Status: DC | PRN
  Administered 2022-06-29: 4 mg via INTRAVENOUS

## 2022-06-29 MED ORDER — DEXAMETHASONE SODIUM PHOSPHATE 10 MG/ML IJ SOLN
INTRAMUSCULAR | Status: AC
  Filled 2022-06-29: qty 1

## 2022-06-29 MED ORDER — EPHEDRINE 5 MG/ML INJ
INTRAVENOUS | Status: AC
  Filled 2022-06-29: qty 5

## 2022-06-29 MED ORDER — ONDANSETRON HCL 4 MG/2ML IJ SOLN
INTRAMUSCULAR | Status: AC
  Filled 2022-06-29: qty 2

## 2022-06-29 MED ORDER — HYDROCODONE-ACETAMINOPHEN 5-325 MG PO TABS
1.0000 | ORAL_TABLET | ORAL | Status: DC | PRN
  Administered 2022-06-29 (×2): 2 via ORAL
  Administered 2022-06-30: 1 via ORAL
  Administered 2022-07-01 – 2022-07-08 (×16): 2 via ORAL
  Filled 2022-06-29 (×13): qty 2
  Filled 2022-06-29: qty 1
  Filled 2022-06-29 (×5): qty 2

## 2022-06-29 MED ORDER — SPOT INK MARKER SYRINGE KIT
PACK | SUBMUCOSAL | Status: DC | PRN
  Administered 2022-06-29: 3 mL via SUBMUCOSAL

## 2022-06-29 MED ORDER — LIDOCAINE HCL (CARDIAC) PF 100 MG/5ML IV SOSY
PREFILLED_SYRINGE | INTRAVENOUS | Status: DC | PRN
  Administered 2022-06-29: 80 mg via INTRAVENOUS

## 2022-06-29 MED ORDER — FENTANYL CITRATE (PF) 100 MCG/2ML IJ SOLN
INTRAMUSCULAR | Status: AC
  Filled 2022-06-29: qty 2

## 2022-06-29 MED ORDER — LACTATED RINGERS IV BOLUS
1000.0000 mL | Freq: Once | INTRAVENOUS | Status: AC
  Administered 2022-06-29: 1000 mL via INTRAVENOUS

## 2022-06-29 MED ORDER — DEXAMETHASONE SODIUM PHOSPHATE 10 MG/ML IJ SOLN
INTRAMUSCULAR | Status: DC | PRN
  Administered 2022-06-29: 5 mg via INTRAVENOUS

## 2022-06-29 MED ORDER — PHENYLEPHRINE HCL-NACL 20-0.9 MG/250ML-% IV SOLN
INTRAVENOUS | Status: DC | PRN
  Administered 2022-06-29: 20 ug/min via INTRAVENOUS

## 2022-06-29 MED ORDER — IOHEXOL 300 MG/ML  SOLN
100.0000 mL | Freq: Once | INTRAMUSCULAR | Status: AC | PRN
  Administered 2022-06-29: 100 mL via INTRAVENOUS

## 2022-06-29 MED ORDER — ONDANSETRON HCL 4 MG/2ML IJ SOLN
4.0000 mg | Freq: Four times a day (QID) | INTRAMUSCULAR | Status: DC | PRN
  Administered 2022-06-29: 4 mg via INTRAVENOUS
  Filled 2022-06-29: qty 2

## 2022-06-29 MED ORDER — MORPHINE SULFATE (PF) 4 MG/ML IV SOLN
4.0000 mg | INTRAVENOUS | Status: AC | PRN
  Administered 2022-06-29 – 2022-06-30 (×2): 4 mg via INTRAVENOUS
  Filled 2022-06-29 (×2): qty 1

## 2022-06-29 MED ORDER — PHENYLEPHRINE 80 MCG/ML (10ML) SYRINGE FOR IV PUSH (FOR BLOOD PRESSURE SUPPORT)
PREFILLED_SYRINGE | INTRAVENOUS | Status: AC
  Filled 2022-06-29: qty 10

## 2022-06-29 MED ORDER — SUCCINYLCHOLINE CHLORIDE 200 MG/10ML IV SOSY
PREFILLED_SYRINGE | INTRAVENOUS | Status: DC | PRN
  Administered 2022-06-29: 80 mg via INTRAVENOUS

## 2022-06-29 MED ORDER — DOCUSATE SODIUM 100 MG PO CAPS: 100.0000 mg | ORAL_CAPSULE | Freq: Two times a day (BID) | ORAL | Status: DC | PRN

## 2022-06-29 MED ORDER — PROPOFOL 10 MG/ML IV BOLUS
INTRAVENOUS | Status: DC | PRN
  Administered 2022-06-29: 150 mg via INTRAVENOUS
  Administered 2022-06-29: 40 mg via INTRAVENOUS
  Administered 2022-06-29: 20 mg via INTRAVENOUS

## 2022-06-29 MED ORDER — AZELASTINE HCL 0.1 % NA SOLN
1.0000 | Freq: Two times a day (BID) | NASAL | Status: DC | PRN
  Administered 2022-07-04 – 2022-07-06 (×2): 1 via NASAL
  Filled 2022-06-29: qty 30

## 2022-06-29 MED ORDER — TRAMADOL HCL 50 MG PO TABS
50.0000 mg | ORAL_TABLET | Freq: Four times a day (QID) | ORAL | Status: DC | PRN
  Administered 2022-06-30 – 2022-07-01 (×2): 50 mg via ORAL
  Filled 2022-06-29 (×2): qty 1

## 2022-06-29 NOTE — Anesthesia Postprocedure Evaluation (Signed)
Anesthesia Post Note  Patient: NAYTHEN HEIKKILA  Procedure(s) Performed: COLONOSCOPY  Patient location during evaluation: PACU Anesthesia Type: General Level of consciousness: awake and alert, oriented and patient cooperative Pain management: pain level controlled Vital Signs Assessment: post-procedure vital signs reviewed and stable Respiratory status: spontaneous breathing, nonlabored ventilation and respiratory function stable Cardiovascular status: blood pressure returned to baseline and stable Postop Assessment: adequate PO intake Anesthetic complications: no   No notable events documented.   Last Vitals:  Vitals:   06/29/22 1438 06/29/22 1448  BP: (!) 164/105 (!) 143/76  Pulse: 81 69  Resp: 18 18  Temp: (!) 36.1 C   SpO2: 100% 100%    Last Pain:  Vitals:   06/29/22 1448  TempSrc:   PainSc: 0-No pain                 Darrin Nipper

## 2022-06-29 NOTE — Anesthesia Procedure Notes (Signed)
Procedure Name: Intubation Date/Time: 06/29/2022 1:40 PM  Performed by: Hedda Slade, CRNAPre-anesthesia Checklist: Patient identified, Patient being monitored, Timeout performed, Emergency Drugs available and Suction available Patient Re-evaluated:Patient Re-evaluated prior to induction Oxygen Delivery Method: Circle system utilized Preoxygenation: Pre-oxygenation with 100% oxygen Induction Type: IV induction and Rapid sequence Laryngoscope Size: Mac and 4 Grade View: Grade I Tube type: Oral Tube size: 7.0 mm Number of attempts: 1 Airway Equipment and Method: Stylet Placement Confirmation: ETT inserted through vocal cords under direct vision, positive ETCO2 and breath sounds checked- equal and bilateral Secured at: 22 cm Tube secured with: Tape Dental Injury: Teeth and Oropharynx as per pre-operative assessment

## 2022-06-29 NOTE — ED Notes (Signed)
Pt aware of NPO status and admission. S/O at bedside leaving for the night. Pt remains on VS monitor. Pending bed assignment

## 2022-06-29 NOTE — H&P (Signed)
Subjective:   CC: Colonic mass  HPI:  [name] is a 71 y.o. male who was consulted by [place] for issue above.  Symptoms were first noted 2 days ago. Pain is sharp, periumbilical.  Associated with nausea vomiting, exacerbated by eating.  CT scan results as noted below.  Last colonoscopy possibly greater than 7 years ago.  Cannot recall why he was told to have next follow-up colonoscopy   Past Medical History:  has a past medical history of Colon cancer ([place]) and Neuropathy.  Past Surgical History:  Past Surgical History:  Procedure Laterality Date   OSTOMY      Family History: No first-degree relatives with history of cancer  Social History: Denies alcohol tobacco drug use  Current Medications:  None reported  Allergies:  Allergies as of 06/28/2022   (Not on File)    ROS:  General: Denies weight loss, weight gain, fatigue, fevers, chills, and night sweats. Eyes: Denies blurry vision, double vision, eye pain, itchy eyes, and tearing. Ears: Denies hearing loss, earache, and ringing in ears. Nose: Denies sinus pain, congestion, infections, runny nose, and nosebleeds. Mouth/throat: Denies hoarseness, sore throat, bleeding gums, and difficulty swallowing. Heart: Denies chest pain, palpitations, racing heart, irregular heartbeat, leg pain or swelling, and decreased activity tolerance. Respiratory: Denies breathing difficulty, shortness of breath, wheezing, cough, and sputum. GI: Denies change in appetite, heartburn,  diarrhea, and blood in stool. GU: Denies difficulty urinating, pain with urinating, urgency, frequency, blood in urine. Musculoskeletal: Denies joint stiffness, pain, swelling, muscle weakness. Skin: Denies rash, itching, mass, tumors, sores, and boils Neurologic: Denies headache, fainting, dizziness, seizures, numbness, and tingling. Psychiatric: Denies depression, anxiety, difficulty sleeping, and memory loss. Endocrine: Denies heat or cold intolerance, and  increased thirst or urination. Blood/lymph: Denies easy bruising, easy bruising, and swollen glands     Objective:     BP (!) 175/99 (BP Location: Right Arm)   Pulse 60   Temp 98.6 F (37 C) (Oral)   Resp 16   Ht 5' 8.5" (1.74 m)   Wt 73.9 kg   SpO2 96%   BMI 24.42 kg/m   Constitutional :  alert, cooperative, appears stated age, and no distress  Lymphatics/Throat:  no asymmetry, masses, or scars  Respiratory:  clear to auscultation bilaterally  Cardiovascular:  regular rate and rhythm  Gastrointestinal: Soft, no guarding, no tenderness to palpation at time of exam.  Ostomy mucosa pink patent, but no stool in bag.  No obvious palpable parastomal hernia but with overall bulging noted around the ostomy site .   Musculoskeletal: Steady movement  Skin: Cool and moist, midline surgical scars   Psychiatric: Normal affect, non-agitated, not confused       LABS:     Latest Ref Rng & Units 06/28/2022    7:45 PM  CMP  Glucose 70 - 99 mg/dL 107   BUN 8 - 23 mg/dL 12   Creatinine 0.61 - 1.24 mg/dL 1.12   Sodium 135 - 145 mmol/L 139   Potassium 3.5 - 5.1 mmol/L 3.5   Chloride 98 - 111 mmol/L 108   CO2 22 - 32 mmol/L 25   Calcium 8.9 - 10.3 mg/dL 9.4   Total Protein 6.5 - 8.1 g/dL 7.7   Total Bilirubin 0.3 - 1.2 mg/dL 0.6   Alkaline Phos 38 - 126 U/L 82   AST 15 - 41 U/L 18   ALT 0 - 44 U/L 11       Latest Ref Rng & Units  06/28/2022    7:45 PM  CBC  WBC 4.0 - 10.5 K/uL 13.5   Hemoglobin 13.0 - 17.0 g/dL 12.2   Hematocrit 39.0 - 52.0 % 38.6   Platelets 150 - 400 K/uL 295     RADS: CLINICAL DATA:  Abdomen pain nausea vomiting   EXAM: CT ABDOMEN AND PELVIS WITH CONTRAST   TECHNIQUE: Multidetector CT imaging of the abdomen and pelvis was performed using the standard protocol following bolus administration of intravenous contrast.   RADIATION DOSE REDUCTION: This exam was performed according to the departmental dose-optimization program which includes  automated exposure control, adjustment of the mA and/or kV according to patient size and/or use of iterative reconstruction technique.   CONTRAST:  146m OMNIPAQUE IOHEXOL 300 MG/ML  SOLN   COMPARISON:  CT 08/11/2009   FINDINGS: Lower chest: Lung bases demonstrate no acute airspace disease. Calcified granuloma at the posterior right lung base.   Hepatobiliary: Gallstones. No biliary dilatation. Subcentimeter hypodensity in the left hepatic lobe too small to further characterize.   Pancreas: Unremarkable. No pancreatic ductal dilatation or surrounding inflammatory changes.   Spleen: Normal in size without focal abnormality.   Adrenals/Urinary Tract: Adrenal glands are within normal limits. Kidneys show no hydronephrosis. Subcentimeter hypodensities in the left kidney are too small to further characterize. Cyst lower pole left kidney, no imaging follow-up is recommended. The bladder is unremarkable.   Stomach/Bowel: The stomach is nonenlarged. There is no dilated small bowel. The patient is status post abdominal perineal resection with left abdominal ostomy. 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 No obstruction.   The ascending and transverse colon are moderately distended. Abrupt caliber change with irregular masslike thickening at the distal transverse colon/splenic flexure, series 2, image 32. The colon distal to this is decompressed. 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 Slightly thick-walled presacral fluid collection measuring 3.6 x 4.4 cm with peripheral calcification, may reflect chronic postoperative collection. Some fluid-filled small bowel in the pelvis.   Vascular/Lymphatic: Moderate aortic atherosclerosis. No aneurysm. No suspicious lymph nodes.   Reproductive: Negative for mass.   Other: Negative for pelvic effusion or free air. Mild presacral soft tissue stranding and  thickening.   Musculoskeletal: No acute osseous abnormality.   IMPRESSION: 1. Patient is status post abdominoperineal resection with left lower quadrant ostomy. The ascending and transverse colon are moderately distended and there is abrupt caliber change at the distal transverse colon/splenic flexure where there is irregular masslike thickening. Findings are suspicious for colonic obstruction due to a mass at this location. There is no dilated small bowel. 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 2. Slightly thick-walled presacral fluid collection with peripheral calcification, may reflect chronic postoperative collection. 3. Gallstones. 4. Aortic atherosclerosis.   Aortic Atherosclerosis (ICD10-I70.0).     Electronically Signed   By: KDonavan FoilM.D.   On: 06/28/2022 22:17   Assessment:   Likely colonic mass noted on CT above, causing large bowel obstruction  Plan:   Discussed CT findings above and recommend diagnostic colonoscopy.  NG decompression, IV fluids in the meantime.  Risk benefits alternatives discussed risks include bleeding, perforation.  Benefits include diagnostic evaluation.  Alternative includes symptomatic treatment but likely will fail, with continued deterioration of current health status.  The patient and wife verbalized understanding and all questions were answered to the patient's satisfaction.  labs/images/medications/previous chart entries reviewed personally and relevant changes/updates noted above.

## 2022-06-29 NOTE — ED Provider Notes (Signed)
[facility] Provider Note    Event Date/Time   First MD Initiated Contact with Patient 06/29/22 0015     (approximate)   History   Abdominal Pain   HPI  [name] is a 71 y.o. male with a prior colonic resection and left-sided ostomy due to colon cancer performed more than 12 years ago by Dr. Tamala Julian.  He presents tonight for evaluation of abdominal pain and vomiting.  He states that the symptoms first started 2 weeks ago when he vomited multiple times and had trouble eating anything.  However the symptoms seem to get better on their own and he did better for a while.  Within the last 24 to 48 hours, he has developed worsening pain throughout his entire lower abdomen and has vomited at least 5 times.  He is trying to eat small amounts and drink small amounts of liquid but it always seems to come back up.  Additionally, he said that he has not passed anything into his ostomy bag for at least 24 hours and has not even felt any gas pass into the bag.     Physical Exam   Triage Vital Signs: ED Triage Vitals  Enc Vitals Group     BP 06/28/22 1948 (!) 175/107     Pulse Rate 06/28/22 1948 71     Resp 06/28/22 1948 18     Temp 06/28/22 1948 98.5 F (36.9 C)     Temp Source 06/28/22 1948 Oral     SpO2 06/28/22 1948 98 %     Weight 06/28/22 1944 73.9 kg (163 lb)     Height 06/28/22 1944 1.74 m (5' 8.5")     Head Circumference --      Peak Flow --      Pain Score 06/28/22 1943 9     Pain Loc --      Pain Edu? --      Excl. in [place]? --     Most recent vital signs: Vitals:   06/29/22 0036 06/29/22 0229  BP: (!) 175/99 (!) 174/95  Pulse: 60 (!) 59  Resp: 16 15  Temp: 98.6 F (37 C) 98.6 F (37 C)  SpO2: 96% 97%     General: Awake, generally well-appearing. CV:  Good peripheral perfusion.  Normal heart sounds. Resp:  Normal effort.  Speaking easily and comfortably, no accessory muscle usage or intercostal retractions. Abd:  No obvious  distention.  Left-sided colostomy bag that is empty.  Mild generalized tenderness to palpation throughout the abdomen but not peritoneal. Other:  Normal mental status, awake and alert.   ED Results / Procedures / Treatments   Labs (all labs ordered are listed, but only abnormal results are displayed) Labs Reviewed  COMPREHENSIVE METABOLIC PANEL - Abnormal; Notable for the following components:      Result Value   Glucose, Bld 107 (*)    All other components within normal limits  CBC - Abnormal; Notable for the following components:   WBC 13.5 (*)    Hemoglobin 12.2 (*)    HCT 38.6 (*)    MCV 79.3 (*)    MCH 25.1 (*)    RDW 16.1 (*)    All other components within normal limits  URINALYSIS, ROUTINE W REFLEX MICROSCOPIC - Abnormal; Notable for the following components:   Color, Urine YELLOW (*)    APPearance CLEAR (*)    Specific Gravity, Urine >1.046 (*)    Hgb urine dipstick SMALL (*)  Protein, ur 100 (*)    All other components within normal limits  LIPASE, BLOOD     RADIOLOGY I viewed and interpreted the patient's CT of the abdomen and pelvis.  Patient has a masslike region that seems to be causing colonic obstruction.  No obvious small bowel dilatation.  Radiologist confirms these findings.    PROCEDURES:  Critical Care performed: No  .1-3 Lead EKG Interpretation  Performed by: Hinda Kehr, MD Authorized by: Hinda Kehr, MD     Interpretation: normal     ECG rate:  70   ECG rate assessment: normal     Rhythm: sinus rhythm     Ectopy: none     Conduction: normal      MEDICATIONS ORDERED IN ED: Medications  morphine (PF) 4 MG/ML injection 4 mg (4 mg Intravenous Given 06/29/22 0042)  ondansetron (ZOFRAN) injection 4 mg (4 mg Intravenous Given 06/29/22 0045)  ondansetron (ZOFRAN-ODT) disintegrating tablet 4 mg (4 mg Oral Given 06/28/22 2303)  iohexol (OMNIPAQUE) 300 MG/ML solution 100 mL (100 mLs Intravenous Contrast Given 06/28/22 2132)  lactated ringers  bolus 1,000 mL (0 mLs Intravenous Stopped 06/29/22 0226)     IMPRESSION / MDM / ASSESSMENT AND PLAN / ED COURSE  I reviewed the triage vital signs and the nursing notes.                              Differential diagnosis includes, but is not limited to, bowel obstruction, recurrence of cancer, electrolyte or metabolic abnormality, viral illness, diverticulitis or other acute bacterial infection.  Patient's presentation is most consistent with acute presentation with potential threat to life or bodily function.  Labs/studies ordered: Urinalysis, comprehensive metabolic panel, lipase, CBC, CT abdomen/pelvis with IV contrast.  The patient is on the cardiac monitor to evaluate for evidence of arrhythmia and/or significant heart rate changes.  Labs notable for a mild leukocytosis of 13.5 of unclear clinical significance.  Hemoglobin is generally appropriate at 12.2.  His electrolytes are also normal and he has an essentially normal renal function with GFR greater than 60.  He came from urgent care earlier today who checked labs and said that his potassium was low at 2.9, but currently it is listed as appropriate at 3.5.  No indication for emergent intervention.  Urinalysis shows no evidence of infection.  However as documented above, the CT scan is concerning for colonic obstruction, possibly due to a mass.  I consulted by phone with Dr. Lysle Pearl who is covering for general surgery.  He will review the CT scan and come consult on the patient in person.  I asked the patient to remain NPO.  I ordered morphine 4 mg IV as needed for abdominal pain as well as Zofran 4 mg IV for nausea/vomiting.  I also ordered LR 1 L IV bolus given his decreased oral intake and n.p.o. status.   Clinical Course as of 06/29/22 0241  Sat Jun 29, 2022  0111 Discussed case in person with Dr. Lysle Pearl who consulted on the patient in the emergency department.  He will admit the patient for further management. [CF]     Clinical Course User Index [CF] Hinda Kehr, MD     FINAL CLINICAL IMPRESSION(S) / ED DIAGNOSES   Final diagnoses:  Colonic obstruction ([place])     Rx / DC Orders   ED Discharge Orders     None        Note:  This  document was prepared using Systems analyst and may include unintentional dictation errors.   Hinda Kehr, MD 06/29/22 [phone]

## 2022-06-29 NOTE — ED Notes (Signed)
Round check complete at this time. Pt is resting with no signs of distress.

## 2022-06-29 NOTE — Transfer of Care (Signed)
Immediate Anesthesia Transfer of Care Note  Patient: [name]  Procedure(s) Performed: COLONOSCOPY  Patient Location: Endoscopy Unit  Anesthesia Type:General  Level of Consciousness: drowsy  Airway & Oxygen Therapy: Patient Spontanous Breathing and Patient connected to face mask oxygen  Post-op Assessment: Report given to RN and Post -op Vital signs reviewed and stable  Post vital signs: Reviewed and stable  Last Vitals:  Vitals Value Taken Time  BP 164/105 06/29/22 1438  Temp 36.1 C 06/29/22 1438  Pulse 81 06/29/22 1438  Resp 18 06/29/22 1438  SpO2 100 % 06/29/22 1438    Last Pain:  Vitals:   06/29/22 1438  TempSrc: Temporal  PainSc: Asleep         Complications: No notable events documented.

## 2022-06-29 NOTE — Op Note (Signed)
[facility] Gastroenterology Patient Name: [name] Procedure Date: 06/29/2022 1:16 PM MRN: [id number] Account #: [id number] Date of Birth: 22-Feb-1951 Admit Type: Outpatient Age: 71 Room: [facility] ENDO ROOM 4 Gender: Male Note Status: Finalized Instrument Name: Jasper Riling [id number] Procedure:             Colonoscopy Indications:           Abnormal CT of the GI tract Providers:             Benjamine Sprague MD, MD Medicines:             General Anesthesia Complications:         No immediate complications. Procedure:             Pre-Anesthesia Assessment:                        - After reviewing the risks and benefits, the patient                         was deemed in satisfactory condition to undergo the                         procedure.                        After obtaining informed consent, the colonoscope was                         passed under direct vision. Throughout the procedure,                         the patient's blood pressure, pulse, and oxygen                         saturations were monitored continuously. The                         Colonoscope was introduced through the anus and                         advanced to the the transverse colon to examine a                         mass. This was the intended extent. The colonoscopy                         was technically difficult and complex due to poor                         bowel prep with stool present. The colonoscopy was                         unusually difficult due to a redundant colon. Findings:      A fungating partially obstructing large mass was found in the mid       transverse colon. The mass was circumferential. In addition, its       diameter measured three mm. No bleeding was present. Area was tattooed       with an injection of Spot (carbon black). Biopsies were taken with a  cold forceps for histology. 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  Biopsied.                        - Malignant-appearing tumor in the colon. Biopsied.                         Tattooed. Recommendation:        - Await pathology results.                        - Return patient to hospital ward for ongoing care. Procedure Code(s):     --- Professional ---                        [phone], 87, Colonoscopy, flexible; with biopsy, single                         or multiple                        45381, 45, Colonoscopy, flexible; with directed                         submucosal injection(s), any substance Diagnosis Code(s):     --- Professional ---                        D49.0, Neoplasm of unspecified behavior of digestive                         system                        R93.3, Abnormal findings on diagnostic imaging of                         other parts of digestive tract                        K56.699, Other intestinal obstruction unspecified as                         to partial versus complete obstruction CPT copyright 2022 American [employer]. All rights reserved. The codes documented in this report are preliminary and upon coder review may  be revised to meet current compliance requirements. Dr. Sheppard Penton, MD Benjamine Sprague MD, MD 06/29/2022 2:49:40 PM This report has been signed electronically. Number of Addenda: 0 Note Initiated On: 06/29/2022 1:16 PM Total Procedure Duration: 0 hours 44 minutes 47 seconds  Estimated Blood Loss:  Estimated blood loss was minimal.      [facility]

## 2022-06-29 NOTE — ED Notes (Signed)
Round check complete at this time. Pt is resting with no signs of distress. Pt was unable to sleep through the night due to pain and nausea. This nurse is not performing the NG tub procedure die to pt finally with little to no pain.

## 2022-06-29 NOTE — Anesthesia Preprocedure Evaluation (Addendum)
Anesthesia Evaluation  Patient identified by MRN, date of birth, ID band Patient awake    Reviewed: Allergy & Precautions, NPO status , Patient's Chart, lab work & pertinent test results  History of Anesthesia Complications Negative for: history of anesthetic complications  Airway Mallampati: IV   Neck ROM: Full    Dental no notable dental hx.    Pulmonary neg pulmonary ROS   Pulmonary exam normal breath sounds clear to auscultation       Cardiovascular Exercise Tolerance: Good negative cardio ROS Normal cardiovascular exam Rhythm:Regular Rate:Normal     Neuro/Psych  Neuromuscular disease (neuropathy)    GI/Hepatic Colon CA s/p resection 2011 with ostomy   Endo/Other  negative endocrine ROS    Renal/GU negative Renal ROS     Musculoskeletal  (+) Arthritis ,    Abdominal   Peds  Hematology negative hematology ROS (+)   Anesthesia Other Findings   Reproductive/Obstetrics                             Anesthesia Physical Anesthesia Plan  ASA: 2  Anesthesia Plan: General   Post-op Pain Management:    Induction: Intravenous and Rapid sequence  PONV Risk Score and Plan: 2 and Treatment may vary due to age or medical condition  Airway Management Planned: Oral ETT  Additional Equipment:   Intra-op Plan:   Post-operative Plan: Extubation in OR  Informed Consent: I have reviewed the patients History and Physical, chart, labs and discussed the procedure including the risks, benefits and alternatives for the proposed anesthesia with the patient or authorized representative who has indicated his/her understanding and acceptance.     Dental advisory given  Plan Discussed with: CRNA  Anesthesia Plan Comments: (Patient consented for risks of anesthesia including but not limited to:  - adverse reactions to medications - damage to eyes, teeth, lips or other oral mucosa - nerve damage  due to positioning  - sore throat or hoarseness - damage to heart, brain, nerves, lungs, other parts of body or loss of life  Informed patient about role of CRNA in peri- and intra-operative care.  Patient voiced understanding.)       Anesthesia Quick Evaluation

## 2022-06-30 ENCOUNTER — Encounter: Payer: Self-pay | Admitting: Surgery

## 2022-06-30 LAB — CBC
HCT: 32.5 % — ABNORMAL LOW (ref 39.0–52.0)
Hemoglobin: 10.4 g/dL — ABNORMAL LOW (ref 13.0–17.0)
MCH: 25.1 pg — ABNORMAL LOW (ref 26.0–34.0)
MCHC: 32 g/dL (ref 30.0–36.0)
MCV: 78.5 fL — ABNORMAL LOW (ref 80.0–100.0)
Platelets: 230 10*3/uL (ref 150–400)
RBC: 4.14 MIL/uL — ABNORMAL LOW (ref 4.22–5.81)
RDW: 16.3 % — ABNORMAL HIGH (ref 11.5–15.5)
WBC: 9.6 10*3/uL (ref 4.0–10.5)
nRBC: 0 % (ref 0.0–0.2)

## 2022-06-30 LAB — BASIC METABOLIC PANEL
Anion gap: 7 (ref 5–15)
BUN: 15 mg/dL (ref 8–23)
CO2: 24 mmol/L (ref 22–32)
Calcium: 8.8 mg/dL — ABNORMAL LOW (ref 8.9–10.3)
Chloride: 107 mmol/L (ref 98–111)
Creatinine, Ser: 0.93 mg/dL (ref 0.61–1.24)
GFR, Estimated: >60 mL/min (ref >60)
Glucose, Bld: 105 mg/dL — ABNORMAL HIGH (ref 70–99)
Potassium: 3.5 mmol/L (ref 3.5–5.1)
Sodium: 138 mmol/L (ref 135–145)

## 2022-06-30 NOTE — Progress Notes (Signed)
Subjective:  CC: [name] is a 71 y.o. male  Hospital stay day 1, 1 Day Post-Op colonoscopy for partially obstructing colon mass  HPI: No issues overnight.  ROS:  General: Denies weight loss, weight gain, fatigue, fevers, chills, and night sweats. Heart: Denies chest pain, palpitations, racing heart, irregular heartbeat, leg pain or swelling, and decreased activity tolerance. Respiratory: Denies breathing difficulty, shortness of breath, wheezing, cough, and sputum. GI: Denies change in appetite, heartburn, nausea, vomiting, constipation, diarrhea, and blood in stool. GU: Denies difficulty urinating, pain with urinating, urgency, frequency, blood in urine.   Objective:   Temp:  [98 F (36.7 C)-98.2 F (36.8 C)] 98 F (36.7 C) (12/10 0824) Pulse Rate:  [66-67] 66 (12/10 0824) Resp:  [16-18] 18 (12/10 0824) BP: (138-155)/(80-91) 153/91 (12/10 0824) SpO2:  [97 %-99 %] 99 % (12/10 0824)     Height: '5\' 8"'$  (172.7 cm) Weight: 73.9 kg BMI (Calculated): 24.78   Intake/Output this shift:   Intake/Output Summary (Last 24 hours) at 06/30/2022 1448 Last data filed at 06/30/2022 1255 Gross per 24 hour  Intake 120 ml  Output 1375 ml  Net -1255 ml    Constitutional :  alert, cooperative, appears stated age, and no distress  Respiratory:  clear to auscultation bilaterally  Cardiovascular:  regular rate and rhythm  Gastrointestinal: soft, non-tender; bowel sounds normal; no masses,  no organomegaly.  Gas in ostomy bag  Skin: Cool and moist.  Midline incision  Psychiatric: Normal affect, non-agitated, not confused       LABS:     Latest Ref Rng & Units 06/30/2022    6:58 AM 06/29/2022    4:14 AM 06/28/2022    7:45 PM  CMP  Glucose 70 - 99 mg/dL 105  99  107   BUN 8 - 23 mg/dL '15  10  12   '$ Creatinine 0.61 - 1.24 mg/dL 0.93  0.99  1.12   Sodium 135 - 145 mmol/L 138  137  139   Potassium 3.5 - 5.1 mmol/L 3.5  3.1  3.5   Chloride 98 - 111 mmol/L 107  107  108   CO2 22 - 32  mmol/L '24  25  25   '$ Calcium 8.9 - 10.3 mg/dL 8.8  8.6  9.4   Total Protein 6.5 - 8.1 g/dL   7.7   Total Bilirubin 0.3 - 1.2 mg/dL   0.6   Alkaline Phos 38 - 126 U/L   82   AST 15 - 41 U/L   18   ALT 0 - 44 U/L   11       Latest Ref Rng & Units 06/30/2022    6:58 AM 06/29/2022    4:14 AM 06/28/2022    7:45 PM  CBC  WBC 4.0 - 10.5 K/uL 9.6  9.5  13.5   Hemoglobin 13.0 - 17.0 g/dL 10.4  11.0  12.2   Hematocrit 39.0 - 52.0 % 32.5  35.1  38.6   Platelets 150 - 400 K/uL 230  244  295     RADS: CLINICAL DATA:  Colon cancer, staging   EXAM: CT CHEST WITH CONTRAST   TECHNIQUE: Multidetector CT imaging of the chest was performed during intravenous contrast administration.   RADIATION DOSE REDUCTION: This exam was performed according to the departmental dose-optimization program which includes automated exposure control, adjustment of the mA and/or kV according to patient size and/or use of iterative reconstruction technique.   CONTRAST:  121m OMNIPAQUE IOHEXOL 300 MG/ML  SOLN   COMPARISON:  06/28/2022, 08/11/2009   FINDINGS: Cardiovascular: The heart is unremarkable without pericardial effusion. No evidence of thoracic aortic aneurysm or dissection. Atherosclerosis of the aorta and coronary vasculature.   Mediastinum/Nodes: Enteric catheter extends into the gastric lumen. Thyroid, trachea, and esophagus are grossly unremarkable. No pathologic mediastinal, hilar, or axillary adenopathy.   Lungs/Pleura: No acute airspace disease, effusion, or pneumothorax. Hypoventilatory changes are seen at the lung bases. Central airways are patent.   Upper Abdomen: Partial visualization of the obstructing mass at the splenic flexure of the colon. Please refer to CT abdomen report from yesterday. Calcified gallstones are again identified without cholecystitis.   Musculoskeletal: No acute or destructive bony lesions. Reconstructed images demonstrate no additional findings.    IMPRESSION: 1. No acute intrathoracic process. No evidence of intrathoracic metastases. 2. Obstructing neoplasm at the splenic flexure of the colon. Please refer to CT abdomen report. 3. Cholelithiasis without cholecystitis. 4. Aortic Atherosclerosis (ICD10-I70.0). Coronary artery atherosclerosis.     Electronically Signed   By: Randa Ngo M.D.   On: 06/29/2022 17:00 Assessment:   Partially obstructing colon mass status post colonoscopy and biopsy  CT lungs clear as noted above.  CEA pending.  Pending final pathology results but likely will need to proceed with resection within the next few days, due to the nearly obstructing nature of the mass itself.  labs/images/medications/previous chart entries reviewed personally and relevant changes/updates noted above.

## 2022-06-30 NOTE — Plan of Care (Signed)

## 2022-06-30 NOTE — Plan of Care (Signed)
Patient axox4,RA,takes meds whole. Orders in for sips with meds and ice chips, and to clamp NG tube when needed. Pain meds given x1 on shift for abd pain. NG tube remains in place with large amount of output earlier part of shift. No additional educational needs noted at this time. Patient is independent with assist due to tube, IV line.  Problem: Education: Goal: Knowledge of General Education information will improve Description: Including pain rating scale, medication(s)/side effects and non-pharmacologic comfort measures Outcome: Progressing   Problem: Health Behavior/Discharge Planning: Goal: Ability to manage health-related needs will improve Outcome: Progressing   Problem: Clinical Measurements: Goal: Ability to maintain clinical measurements within normal limits will improve Outcome: Progressing Goal: Will remain free from infection Outcome: Progressing Goal: Diagnostic test results will improve Outcome: Progressing Goal: Respiratory complications will improve Outcome: Progressing Goal: Cardiovascular complication will be avoided Outcome: Progressing   Problem: Activity: Goal: Risk for activity intolerance will decrease Outcome: Progressing   Problem: Nutrition: Goal: Adequate nutrition will be maintained Outcome: Progressing   Problem: Coping: Goal: Level of anxiety will decrease Outcome: Progressing   Problem: Elimination: Goal: Will not experience complications related to bowel motility Outcome: Progressing Goal: Will not experience complications related to urinary retention Outcome: Progressing   Problem: Pain Managment: Goal: General experience of comfort will improve Outcome: Progressing   Problem: Safety: Goal: Ability to remain free from injury will improve Outcome: Progressing   Problem: Skin Integrity: Goal: Risk for impaired skin integrity will decrease Outcome: Progressing

## 2022-07-01 ENCOUNTER — Encounter: Payer: Self-pay | Admitting: Surgery

## 2022-07-01 ENCOUNTER — Inpatient Hospital Stay: Payer: Self-pay

## 2022-07-01 DIAGNOSIS — E43 Unspecified severe protein-calorie malnutrition: Secondary | ICD-10-CM | POA: Insufficient documentation

## 2022-07-01 LAB — BASIC METABOLIC PANEL
Anion gap: 7 (ref 5–15)
BUN: 14 mg/dL (ref 8–23)
CO2: 25 mmol/L (ref 22–32)
Calcium: 8.3 mg/dL — ABNORMAL LOW (ref 8.9–10.3)
Chloride: 105 mmol/L (ref 98–111)
Creatinine, Ser: 0.76 mg/dL (ref 0.61–1.24)
GFR, Estimated: >60 mL/min (ref >60)
Glucose, Bld: 81 mg/dL (ref 70–99)
Potassium: 3 mmol/L — ABNORMAL LOW (ref 3.5–5.1)
Sodium: 137 mmol/L (ref 135–145)

## 2022-07-01 LAB — CBC
HCT: 32 % — ABNORMAL LOW (ref 39.0–52.0)
Hemoglobin: 10.5 g/dL — ABNORMAL LOW (ref 13.0–17.0)
MCH: 25.6 pg — ABNORMAL LOW (ref 26.0–34.0)
MCHC: 32.8 g/dL (ref 30.0–36.0)
MCV: 78 fL — ABNORMAL LOW (ref 80.0–100.0)
Platelets: 212 10*3/uL (ref 150–400)
RBC: 4.1 MIL/uL — ABNORMAL LOW (ref 4.22–5.81)
RDW: 16.2 % — ABNORMAL HIGH (ref 11.5–15.5)
WBC: 11.5 10*3/uL — ABNORMAL HIGH (ref 4.0–10.5)
nRBC: 0 % (ref 0.0–0.2)

## 2022-07-01 MED ORDER — KCL IN DEXTROSE-NACL 40-5-0.45 MEQ/L-%-% IV SOLN
INTRAVENOUS | Status: AC
  Filled 2022-07-01 (×4): qty 1000

## 2022-07-01 NOTE — TOC CM/SW Note (Signed)
  Transition of Care [facility]) Screening Note   Patient Details  Name: [name] Date of Birth: [birth year]   Transition of Care [facility]) CM/SW Contact:    Candie Chroman, LCSW Phone Number: 07/01/2022, 9:18 AM    Transition of Care Department [facility]) has reviewed patient and no TOC needs have been identified at this time. We will continue to monitor patient advancement through interdisciplinary progression rounds. If new patient transition needs arise, please place a TOC consult.

## 2022-07-01 NOTE — Plan of Care (Signed)

## 2022-07-01 NOTE — Progress Notes (Signed)
Subjective:  CC: [name] is a 71 y.o. male  Hospital stay day 2, 2 Days Post-Op colonoscopy for partially obstructing colon mass  HPI: No issues overnight.  ROS:  General: Denies weight loss, weight gain, fatigue, fevers, chills, and night sweats. Heart: Denies chest pain, palpitations, racing heart, irregular heartbeat, leg pain or swelling, and decreased activity tolerance. Respiratory: Denies breathing difficulty, shortness of breath, wheezing, cough, and sputum. GI: Denies change in appetite, heartburn, nausea, vomiting, constipation, diarrhea, and blood in stool. GU: Denies difficulty urinating, pain with urinating, urgency, frequency, blood in urine.   Objective:   Temp:  [97.9 F (36.6 C)-98.4 F (36.9 C)] 98.4 F (36.9 C) (12/11 1924) Pulse Rate:  [56-64] 60 (12/11 1924) Resp:  [17-18] 18 (12/11 1924) BP: (131-152)/(66-90) 152/76 (12/11 1924) SpO2:  [97 %] 97 % (12/11 1924) Weight:  [71.8 kg] 71.8 kg (12/11 1648)     Height: '5\' 8"'$  (172.7 cm) Weight: 71.8 kg BMI (Calculated): 24.06   Intake/Output this shift:   Intake/Output Summary (Last 24 hours) at 07/01/2022 2131 Last data filed at 07/01/2022 1455 Gross per 24 hour  Intake --  Output 1750 ml  Net -1750 ml    Constitutional :  alert, cooperative, appears stated age, and no distress  Respiratory:  clear to auscultation bilaterally  Cardiovascular:  regular rate and rhythm  Gastrointestinal: soft, non-tender; bowel sounds normal; no masses,  no organomegaly.  Gas still in ostomy bag  Skin: Cool and moist.  Midline incision  Psychiatric: Normal affect, non-agitated, not confused       LABS:     Latest Ref Rng & Units 07/01/2022    5:16 AM 06/30/2022    6:58 AM 06/29/2022    4:14 AM  CMP  Glucose 70 - 99 mg/dL 81  105  99   BUN 8 - 23 mg/dL '14  15  10   '$ Creatinine 0.61 - 1.24 mg/dL 0.76  0.93  0.99   Sodium 135 - 145 mmol/L 137  138  137   Potassium 3.5 - 5.1 mmol/L 3.0  3.5  3.1   Chloride 98 -  111 mmol/L 105  107  107   CO2 22 - 32 mmol/L '25  24  25   '$ Calcium 8.9 - 10.3 mg/dL 8.3  8.8  8.6       Latest Ref Rng & Units 07/01/2022    5:16 AM 06/30/2022    6:58 AM 06/29/2022    4:14 AM  CBC  WBC 4.0 - 10.5 K/uL 11.5  9.6  9.5   Hemoglobin 13.0 - 17.0 g/dL 10.5  10.4  11.0   Hematocrit 39.0 - 52.0 % 32.0  32.5  35.1   Platelets 150 - 400 K/uL 212  230  244     RADS: CLINICAL DATA:  Colon cancer, staging   EXAM: CT CHEST WITH CONTRAST   TECHNIQUE: Multidetector CT imaging of the chest was performed during intravenous contrast administration.   RADIATION DOSE REDUCTION: This exam was performed according to the departmental dose-optimization program which includes automated exposure control, adjustment of the mA and/or kV according to patient size and/or use of iterative reconstruction technique.   CONTRAST:  16m OMNIPAQUE IOHEXOL 300 MG/ML  SOLN   COMPARISON:  06/28/2022, 08/11/2009   FINDINGS: Cardiovascular: The heart is unremarkable without pericardial effusion. No evidence of thoracic aortic aneurysm or dissection. Atherosclerosis of the aorta and coronary vasculature.   Mediastinum/Nodes: Enteric catheter extends into the gastric lumen. Thyroid, trachea, and  esophagus are grossly unremarkable. No pathologic mediastinal, hilar, or axillary adenopathy.   Lungs/Pleura: No acute airspace disease, effusion, or pneumothorax. Hypoventilatory changes are seen at the lung bases. Central airways are patent.   Upper Abdomen: Partial visualization of the obstructing mass at the splenic flexure of the colon. Please refer to CT abdomen report from yesterday. Calcified gallstones are again identified without cholecystitis.   Musculoskeletal: No acute or destructive bony lesions. Reconstructed images demonstrate no additional findings.   IMPRESSION: 1. No acute intrathoracic process. No evidence of intrathoracic metastases. 2. Obstructing neoplasm at the splenic  flexure of the colon. Please refer to CT abdomen report. 3. Cholelithiasis without cholecystitis. 4. Aortic Atherosclerosis (ICD10-I70.0). Coronary artery atherosclerosis.     Electronically Signed   By: Randa Ngo M.D.   On: 06/29/2022 17:00 Assessment:   Partially obstructing colon mass status post colonoscopy and biopsy  CT lungs clear as noted above.  CEA pending.  Pending final pathology results.  Still scheduled to proceed with diverting ostomy due to likely malignant pathology results.  Pathology results will determine the margins needed for the resection.  labs/images/medications/previous chart entries reviewed personally and relevant changes/updates noted above.

## 2022-07-01 NOTE — Progress Notes (Signed)
Initial Nutrition Assessment  DOCUMENTATION CODES:   Severe malnutrition in context of acute illness/injury  INTERVENTION:   TPN per pharmacy   Recommend thiamine 131m daily added to TPN x 3 days  Pt at high refeed risk; recommend monitor potassium, magnesium and phosphorus labs daily until stable  Daily weights   NUTRITION DIAGNOSIS:   Severe Malnutrition related to acute illness as evidenced by percent weight loss, moderate fat depletion, moderate muscle depletion.  GOAL:   Patient will meet greater than or equal to 90% of their needs  MONITOR:   Diet advancement, Labs, Weight trends, Skin, I & O's, Other (Comment) (TPN)  REASON FOR ASSESSMENT:   Malnutrition Screening Tool    ASSESSMENT:   71y/o male with h/o polyneuropathy, rectal cancer s/p abdominoperineal proctocolectomy and abdominal perineal resection 2011 s/p chemo and XRT who is admitted with LBO secondary to new colon mass.  Met with in room today. Pt reports poor appetite and oral intake for 2 weeks pta r/t nausea and vomiting. Pt reports that he has been unable to keep anything down. Pt has remained NPO since admission and is now without adequate nutrition for > 7 days. Plan is for PICC line and TPN to start tomorrow. Pt is at high refeed risk. RD discussed with pt the importance of adequate nutrition needed to preserve lean muscle. Pt is willing to drink supplements with diet advancement. NGT in place with 10062moutput. Plan is for Hartmann's procedure 12/13.   Pt reports at 20lb weight loss over the past month. Per chart, pt does appear to be down ~22lbs(12%) from his UBW.   Medications reviewed and include: NaCl w/ 5% dextrose & Kcl _0 /hr  Labs reviewed: K 3.0(L) Wbc- 11.5(H), Hgb 10.5(L), Hct 32.0(L), MCV 78.0(L), MCH 25.6(L)  NUTRITION - FOCUSED PHYSICAL EXAM:  Flowsheet Row Most Recent Value  Orbital Region No depletion  Upper Arm Region Severe depletion  Thoracic and Lumbar Region Moderate  depletion  Buccal Region No depletion  Temple Region No depletion  Clavicle Bone Region Moderate depletion  Clavicle and Acromion Bone Region Moderate depletion  Scapular Bone Region Mild depletion  Dorsal Hand Moderate depletion  Patellar Region Moderate depletion  Anterior Thigh Region Moderate depletion  Posterior Calf Region Moderate depletion  Edema (RD Assessment) None  Hair Reviewed  Eyes Reviewed  Mouth Reviewed  Skin Reviewed  Nails Reviewed   Diet Order:   Diet Order             Diet NPO time specified Except for: Sips with Meds, Ice Chips  Diet effective now                  EDUCATION NEEDS:   Education needs have been addressed  Skin:  Skin Assessment: Reviewed RN Assessment  Last BM:  pta  Height:   Ht Readings from Last 1 Encounters:  06/29/22 _1  (1.727 m)    Weight:   Wt Readings from Last 1 Encounters:  07/01/22 71.8 kg    Ideal Body Weight:  70 kg  BMI:  Body mass index is 24.05 kg/m.  Estimated Nutritional Needs:   Kcal:  1900-2200kcal/day  Protein:  95-110g/day  Fluid:  1.9-2.2L/day  CaKoleen DistanceS, RD, LDN Please refer to [facility] and/or RD on-call/weekend/after hours pager

## 2022-07-02 LAB — COMPREHENSIVE METABOLIC PANEL
ALT: 10 U/L (ref 0–44)
AST: 14 U/L — ABNORMAL LOW (ref 15–41)
Albumin: 3.2 g/dL — ABNORMAL LOW (ref 3.5–5.0)
Alkaline Phosphatase: 62 U/L (ref 38–126)
Anion gap: 8 (ref 5–15)
BUN: 14 mg/dL (ref 8–23)
CO2: 24 mmol/L (ref 22–32)
Calcium: 8.5 mg/dL — ABNORMAL LOW (ref 8.9–10.3)
Chloride: 104 mmol/L (ref 98–111)
Creatinine, Ser: 0.78 mg/dL (ref 0.61–1.24)
GFR, Estimated: >60 mL/min (ref >60)
Glucose, Bld: 96 mg/dL (ref 70–99)
Potassium: 3.1 mmol/L — ABNORMAL LOW (ref 3.5–5.1)
Sodium: 136 mmol/L (ref 135–145)
Total Bilirubin: 0.9 mg/dL (ref 0.3–1.2)
Total Protein: 6.1 g/dL — ABNORMAL LOW (ref 6.5–8.1)

## 2022-07-02 LAB — CBC
HCT: 33.1 % — ABNORMAL LOW (ref 39.0–52.0)
Hemoglobin: 10.9 g/dL — ABNORMAL LOW (ref 13.0–17.0)
MCH: 25.5 pg — ABNORMAL LOW (ref 26.0–34.0)
MCHC: 32.9 g/dL (ref 30.0–36.0)
MCV: 77.3 fL — ABNORMAL LOW (ref 80.0–100.0)
Platelets: 226 10*3/uL (ref 150–400)
RBC: 4.28 MIL/uL (ref 4.22–5.81)
RDW: 16 % — ABNORMAL HIGH (ref 11.5–15.5)
WBC: 9.1 10*3/uL (ref 4.0–10.5)
nRBC: 0 % (ref 0.0–0.2)

## 2022-07-02 LAB — ABO/RH: ABO/RH(D): O NEG

## 2022-07-02 LAB — TRIGLYCERIDES: Triglycerides: 83 mg/dL (ref <150)

## 2022-07-02 LAB — CEA: CEA: 8.3 ng/mL — ABNORMAL HIGH (ref 0.0–4.7)

## 2022-07-02 LAB — PHOSPHORUS: Phosphorus: 2.7 mg/dL (ref 2.5–4.6)

## 2022-07-02 LAB — SURGICAL PATHOLOGY

## 2022-07-02 LAB — MAGNESIUM: Magnesium: 2 mg/dL (ref 1.7–2.4)

## 2022-07-02 MED ORDER — TRAVASOL 10 % IV SOLN: INTRAVENOUS | Status: DC

## 2022-07-02 MED ORDER — SODIUM CHLORIDE 0.9% FLUSH: 10.0000 mL | INTRAVENOUS | Status: DC | PRN

## 2022-07-02 MED ORDER — POTASSIUM CHLORIDE 20 MEQ PO PACK
20.0000 meq | PACK | ORAL | Status: AC
  Administered 2022-07-02 (×2): 20 meq
  Filled 2022-07-02 (×2): qty 1

## 2022-07-02 MED ORDER — TRAVASOL 10 % IV SOLN
INTRAVENOUS | Status: AC
  Filled 2022-07-02: qty 480

## 2022-07-02 MED ORDER — SODIUM CHLORIDE 0.9% FLUSH
10.0000 mL | Freq: Two times a day (BID) | INTRAVENOUS | Status: DC
  Administered 2022-07-02 – 2022-07-08 (×8): 10 mL

## 2022-07-02 MED ORDER — CHLORHEXIDINE GLUCONATE CLOTH 2 % EX PADS
6.0000 | MEDICATED_PAD | Freq: Every day | CUTANEOUS | Status: DC
  Administered 2022-07-02 – 2022-07-08 (×6): 6 via TOPICAL

## 2022-07-02 NOTE — Progress Notes (Signed)
Subjective:  CC: [name] is a 71 y.o. male  Hospital stay day 3, 3 Days Post-Op colonoscopy for partially obstructing colon mass  HPI: No issues overnight.  ROS:  General: Denies weight loss, weight gain, fatigue, fevers, chills, and night sweats. Heart: Denies chest pain, palpitations, racing heart, irregular heartbeat, leg pain or swelling, and decreased activity tolerance. Respiratory: Denies breathing difficulty, shortness of breath, wheezing, cough, and sputum. GI: Denies change in appetite, heartburn, nausea, vomiting, constipation, diarrhea, and blood in stool. GU: Denies difficulty urinating, pain with urinating, urgency, frequency, blood in urine.   Objective:   Temp:  [98.2 F (36.8 C)-98.5 F (36.9 C)] 98.4 F (36.9 C) (12/12 0825) Pulse Rate:  [56-65] 60 (12/12 0825) Resp:  [18] 18 (12/12 0825) BP: (148-167)/(76-91) 148/79 (12/12 0825) SpO2:  [94 %-97 %] 96 % (12/12 0825) Weight:  [68.8 kg-71.8 kg] 68.8 kg (12/12 1100)     Height: '5\' 8"'$  (172.7 cm) Weight: 68.8 kg BMI (Calculated): 23.07   Intake/Output this shift:   Intake/Output Summary (Last 24 hours) at 07/02/2022 1508 Last data filed at 07/02/2022 1252 Gross per 24 hour  Intake 1096.09 ml  Output 1725 ml  Net -628.91 ml    Constitutional :  alert, cooperative, appears stated age, and no distress  Respiratory:  clear to auscultation bilaterally  Cardiovascular:  regular rate and rhythm  Gastrointestinal: soft, non-tender; bowel sounds normal; no masses,  no organomegaly.  Gas still in ostomy bag  Skin: Cool and moist.  Midline incision  Psychiatric: Normal affect, non-agitated, not confused       LABS:     Latest Ref Rng & Units 07/02/2022    5:40 AM 07/01/2022    5:16 AM 06/30/2022    6:58 AM  CMP  Glucose 70 - 99 mg/dL 96  81  105   BUN 8 - 23 mg/dL '14  14  15   '$ Creatinine 0.61 - 1.24 mg/dL 0.78  0.76  0.93   Sodium 135 - 145 mmol/L 136  137  138   Potassium 3.5 - 5.1 mmol/L 3.1  3.0   3.5   Chloride 98 - 111 mmol/L 104  105  107   CO2 22 - 32 mmol/L '24  25  24   '$ Calcium 8.9 - 10.3 mg/dL 8.5  8.3  8.8   Total Protein 6.5 - 8.1 g/dL 6.1     Total Bilirubin 0.3 - 1.2 mg/dL 0.9     Alkaline Phos 38 - 126 U/L 62     AST 15 - 41 U/L 14     ALT 0 - 44 U/L 10         Latest Ref Rng & Units 07/02/2022    5:40 AM 07/01/2022    5:16 AM 06/30/2022    6:58 AM  CBC  WBC 4.0 - 10.5 K/uL 9.1  11.5  9.6   Hemoglobin 13.0 - 17.0 g/dL 10.9  10.5  10.4   Hematocrit 39.0 - 52.0 % 33.1  32.0  32.5   Platelets 150 - 400 K/uL 226  212  230    PATIENT: [facility]     Specimen Submitted: A. Colon mass; cbx  Clinical History: Colon mass      DIAGNOSIS: A.  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   GROSS DESCRIPTION: A. Labeled: cbx colon mass Received: Formalin Collection time: 2:24 PM on 06/29/2022 Placed into formalin time: 2:24 PM on 06/29/2022 Tissue fragment(s): 2 Size:  Range from 0.2-0.5 cm Description: Tan soft tissue fragments Entirely submitted in 1 cassette.  RB 07/01/2022  Final Diagnosis performed by Theodora Blow, MD.   Electronically signed 07/02/2022 10:52:47AM The electronic signature indicates that the named Attending Pathologist has evaluated the specimen Technical component performed at [facility], [address] [place], [place] [address] Lab: [phone] Dir: Rush Farmer, MD, MMM  Professional component performed at [facility], [facility], [place], [place],  [address] Lab: [phone] Dir: Kathi Simpers, MD   RADS: CLINICAL DATA:  Colon cancer, staging   EXAM: CT CHEST WITH CONTRAST   TECHNIQUE: Multidetector CT imaging of the chest was performed during intravenous contrast administration.   RADIATION DOSE REDUCTION: This exam was performed according to the departmental dose-optimization program which includes automated exposure control,  adjustment of the mA and/or kV according to patient size and/or use of iterative reconstruction technique.   CONTRAST:  121m OMNIPAQUE IOHEXOL 300 MG/ML  SOLN   COMPARISON:  06/28/2022, 08/11/2009   FINDINGS: Cardiovascular: The heart is unremarkable without pericardial effusion. No evidence of thoracic aortic aneurysm or dissection. Atherosclerosis of the aorta and coronary vasculature.   Mediastinum/Nodes: Enteric catheter extends into the gastric lumen. Thyroid, trachea, and esophagus are grossly unremarkable. No pathologic mediastinal, hilar, or axillary adenopathy.   Lungs/Pleura: No acute airspace disease, effusion, or pneumothorax. Hypoventilatory changes are seen at the lung bases. Central airways are patent.   Upper Abdomen: Partial visualization of the obstructing mass at the splenic flexure of the colon. Please refer to CT abdomen report from yesterday. Calcified gallstones are again identified without cholecystitis.   Musculoskeletal: No acute or destructive bony lesions. Reconstructed images demonstrate no additional findings.   IMPRESSION: 1. No acute intrathoracic process. No evidence of intrathoracic metastases. 2. Obstructing neoplasm at the splenic flexure of the colon. Please refer to CT abdomen report. 3. Cholelithiasis without cholecystitis. 4. Aortic Atherosclerosis (ICD10-I70.0). Coronary artery atherosclerosis.     Electronically Signed   By: MRanda NgoM.D.   On: 06/29/2022 17:00 Assessment:   Partially obstructing colon mass status post colonoscopy and biopsy  CT lungs clear as noted above.  CEA pending.  Final pathology confirms invasive adenocarcinoma.  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  Discussed pathophisiology and treatment options including NG tube decompression and possible surgery for lysis of adhesions, laparoscopic or open.  The risk of surgery include, but not limited  to, recurrence, bleeding, chronic pain, post-op infxn, post-op SBO or ileus, hernias, resection of bowel, re-anastamosis, possible ostomy placement and need for re-operation to address said risks. The risks of general anesthetic, if used, includes MI, CVA, sudden death or even reaction to anesthetic medications also discussed. Alternatives include continued observation and NG decompression.  Benefits include possible symptom relief, preventing further decline in health and possible death.  Typical post-op recovery time of additional days in hospital for observation afterwards also discussed.  The patient verbalized understanding and all questions were answered to the patient's satisfaction.  labs/images/medications/previous chart entries reviewed personally and relevant changes/updates noted above.

## 2022-07-02 NOTE — Care Management Important Message (Signed)
Important Message  Patient Details  Name: [name] MRN: [id number] Date of Birth: April 21, 1951   Medicare Important Message Given:  N/A - LOS <3 / Initial given by admissions     Dannette Barbara 07/02/2022, 10:47 AM

## 2022-07-02 NOTE — Progress Notes (Signed)
PHARMACY - TOTAL PARENTERAL NUTRITION CONSULT NOTE   Indication: Small bowel obstruction  Patient Measurements: Height: '5\' 8"'$  (172.7 cm) Weight: 71.8 kg (158 lb 3.2 oz) IBW/kg (Calculated) : 68.4 TPN AdjBW (KG): 73.9 Body mass index is 24.05 kg/m. Usual Weight: 74 kg  Assessment:  71 year old male PMH including polyneuropathy, rectal cancer s/p abdominoperineal proctocolectomy and abdominal perineal resection 2011 s/p chemo and XRT admitted with small bowel obstruction. Pharmacy has been consulted to start and manage TPN.  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 Cl:Ac 1:1 Add standard MVI and trace elements to TPN Initiate Sensitive q8h SSI and adjust as needed  Continue MIVF at 75 mL/hr (D5-1/2NS) to prevent hypoglycemia (also is net negative by 1L). Consider reducing rate 12/13.  Monitor TPN labs on Mon/Thurs.   CarolGlean SalvormD, BCPS Clinical Pharmacist  07/02/2022 8:16 AM

## 2022-07-02 NOTE — Plan of Care (Signed)

## 2022-07-02 NOTE — Progress Notes (Signed)
Peripherally Inserted Central Catheter Placement  The IV Nurse has discussed with the patient and/or persons authorized to consent for the patient, the purpose of this procedure and the potential benefits and risks involved with this procedure.  The benefits include less needle sticks, lab draws from the catheter, and the patient may be discharged home with the catheter. Risks include, but not limited to, infection, bleeding, blood clot (thrombus formation), and puncture of an artery; nerve damage and irregular heartbeat and possibility to perform a PICC exchange if needed/ordered by physician.  Alternatives to this procedure were also discussed.  Bard Power PICC patient education guide, fact sheet on infection prevention and patient information card has been provided to patient /or left at bedside.    PICC Placement Documentation  PICC Double Lumen 89/84/21 Right Basilic 38 cm 0 cm (Active)  Indication for Insertion or Continuance of Line Administration of hyperosmolar/irritating solutions (i.e. TPN, Vancomycin, etc.) 07/02/22 0806  Exposed Catheter (cm) 0 cm 07/02/22 0806  Site Assessment Clean, Dry, Intact 07/02/22 0806  Lumen #1 Status Flushed;Saline locked;Blood return noted 07/02/22 0806  Lumen #2 Status Flushed;Saline locked;Blood return noted 07/02/22 0806  Dressing Type Transparent;Securing device 07/02/22 0806  Dressing Status Antimicrobial disc in place;Clean, Dry, Intact 07/02/22 0806  Safety Lock Not Applicable 10/01/79 1886  Line Adjustment (NICU/IV Team Only) No 07/02/22 0806  Dressing Intervention Other (Comment) 07/02/22 0806  Dressing Change Due 07/09/22 07/02/22 0806       Enos Fling 07/02/2022, 8:07 AM

## 2022-07-02 NOTE — H&P (View-Only) (Signed)
Subjective:  CC: [name] is a 71 y.o. male  Hospital stay day 3, 3 Days Post-Op colonoscopy for partially obstructing colon mass  HPI: No issues overnight.  ROS:  General: Denies weight loss, weight gain, fatigue, fevers, chills, and night sweats. Heart: Denies chest pain, palpitations, racing heart, irregular heartbeat, leg pain or swelling, and decreased activity tolerance. Respiratory: Denies breathing difficulty, shortness of breath, wheezing, cough, and sputum. GI: Denies change in appetite, heartburn, nausea, vomiting, constipation, diarrhea, and blood in stool. GU: Denies difficulty urinating, pain with urinating, urgency, frequency, blood in urine.   Objective:   Temp:  [98.2 F (36.8 C)-98.5 F (36.9 C)] 98.4 F (36.9 C) (12/12 0825) Pulse Rate:  [56-65] 60 (12/12 0825) Resp:  [18] 18 (12/12 0825) BP: (148-167)/(76-91) 148/79 (12/12 0825) SpO2:  [94 %-97 %] 96 % (12/12 0825) Weight:  [68.8 kg-71.8 kg] 68.8 kg (12/12 1100)     Height: '5\' 8"'$  (172.7 cm) Weight: 68.8 kg BMI (Calculated): 23.07   Intake/Output this shift:   Intake/Output Summary (Last 24 hours) at 07/02/2022 1508 Last data filed at 07/02/2022 1252 Gross per 24 hour  Intake 1096.09 ml  Output 1725 ml  Net -628.91 ml    Constitutional :  alert, cooperative, appears stated age, and no distress  Respiratory:  clear to auscultation bilaterally  Cardiovascular:  regular rate and rhythm  Gastrointestinal: soft, non-tender; bowel sounds normal; no masses,  no organomegaly.  Gas still in ostomy bag  Skin: Cool and moist.  Midline incision  Psychiatric: Normal affect, non-agitated, not confused       LABS:     Latest Ref Rng & Units 07/02/2022    5:40 AM 07/01/2022    5:16 AM 06/30/2022    6:58 AM  CMP  Glucose 70 - 99 mg/dL 96  81  105   BUN 8 - 23 mg/dL '14  14  15   '$ Creatinine 0.61 - 1.24 mg/dL 0.78  0.76  0.93   Sodium 135 - 145 mmol/L 136  137  138   Potassium 3.5 - 5.1 mmol/L 3.1  3.0   3.5   Chloride 98 - 111 mmol/L 104  105  107   CO2 22 - 32 mmol/L '24  25  24   '$ Calcium 8.9 - 10.3 mg/dL 8.5  8.3  8.8   Total Protein 6.5 - 8.1 g/dL 6.1     Total Bilirubin 0.3 - 1.2 mg/dL 0.9     Alkaline Phos 38 - 126 U/L 62     AST 15 - 41 U/L 14     ALT 0 - 44 U/L 10         Latest Ref Rng & Units 07/02/2022    5:40 AM 07/01/2022    5:16 AM 06/30/2022    6:58 AM  CBC  WBC 4.0 - 10.5 K/uL 9.1  11.5  9.6   Hemoglobin 13.0 - 17.0 g/dL 10.9  10.5  10.4   Hematocrit 39.0 - 52.0 % 33.1  32.0  32.5   Platelets 150 - 400 K/uL 226  212  230    PATIENT: [facility]     Specimen Submitted: A. Colon mass; cbx  Clinical History: Colon mass      DIAGNOSIS: A.  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   GROSS DESCRIPTION: A. Labeled: cbx colon mass Received: Formalin Collection time: 2:24 PM on 06/29/2022 Placed into formalin time: 2:24 PM on 06/29/2022 Tissue fragment(s): 2 Size:  Range from 0.2-0.5 cm Description: Tan soft tissue fragments Entirely submitted in 1 cassette.  RB 07/01/2022  Final Diagnosis performed by Theodora Blow, MD.   Electronically signed 07/02/2022 10:52:47AM The electronic signature indicates that the named Attending Pathologist has evaluated the specimen Technical component performed at [facility], [address] [place], [place] [address] Lab: [phone] Dir: Rush Farmer, MD, MMM  Professional component performed at [facility], [facility], [place], [place],  [address] Lab: [phone] Dir: Kathi Simpers, MD   RADS: CLINICAL DATA:  Colon cancer, staging   EXAM: CT CHEST WITH CONTRAST   TECHNIQUE: Multidetector CT imaging of the chest was performed during intravenous contrast administration.   RADIATION DOSE REDUCTION: This exam was performed according to the departmental dose-optimization program which includes automated exposure control,  adjustment of the mA and/or kV according to patient size and/or use of iterative reconstruction technique.   CONTRAST:  155m OMNIPAQUE IOHEXOL 300 MG/ML  SOLN   COMPARISON:  06/28/2022, 08/11/2009   FINDINGS: Cardiovascular: The heart is unremarkable without pericardial effusion. No evidence of thoracic aortic aneurysm or dissection. Atherosclerosis of the aorta and coronary vasculature.   Mediastinum/Nodes: Enteric catheter extends into the gastric lumen. Thyroid, trachea, and esophagus are grossly unremarkable. No pathologic mediastinal, hilar, or axillary adenopathy.   Lungs/Pleura: No acute airspace disease, effusion, or pneumothorax. Hypoventilatory changes are seen at the lung bases. Central airways are patent.   Upper Abdomen: Partial visualization of the obstructing mass at the splenic flexure of the colon. Please refer to CT abdomen report from yesterday. Calcified gallstones are again identified without cholecystitis.   Musculoskeletal: No acute or destructive bony lesions. Reconstructed images demonstrate no additional findings.   IMPRESSION: 1. No acute intrathoracic process. No evidence of intrathoracic metastases. 2. Obstructing neoplasm at the splenic flexure of the colon. Please refer to CT abdomen report. 3. Cholelithiasis without cholecystitis. 4. Aortic Atherosclerosis (ICD10-I70.0). Coronary artery atherosclerosis.     Electronically Signed   By: MRanda NgoM.D.   On: 06/29/2022 17:00 Assessment:   Partially obstructing colon mass status post colonoscopy and biopsy  CT lungs clear as noted above.  CEA pending.  Final pathology confirms invasive adenocarcinoma.  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  Discussed pathophisiology and treatment options including NG tube decompression and possible surgery for lysis of adhesions, laparoscopic or open.  The risk of surgery include, but not limited  to, recurrence, bleeding, chronic pain, post-op infxn, post-op SBO or ileus, hernias, resection of bowel, re-anastamosis, possible ostomy placement and need for re-operation to address said risks. The risks of general anesthetic, if used, includes MI, CVA, sudden death or even reaction to anesthetic medications also discussed. Alternatives include continued observation and NG decompression.  Benefits include possible symptom relief, preventing further decline in health and possible death.  Typical post-op recovery time of additional days in hospital for observation afterwards also discussed.  The patient verbalized understanding and all questions were answered to the patient's satisfaction.  labs/images/medications/previous chart entries reviewed personally and relevant changes/updates noted above.

## 2022-07-03 ENCOUNTER — Inpatient Hospital Stay: Payer: PPO | Admitting: Registered Nurse

## 2022-07-03 ENCOUNTER — Encounter
Admission: EM | Disposition: A | Discharge status: Home / Self Care | Payer: Self-pay | Source: Home / Self Care | Attending: Surgery

## 2022-07-03 ENCOUNTER — Other Ambulatory Visit: Payer: Self-pay

## 2022-07-03 ENCOUNTER — Encounter: Payer: Self-pay | Admitting: Surgery

## 2022-07-03 HISTORY — PX: COLECTOMY WITH COLOSTOMY CREATION/HARTMANN PROCEDURE: SHX6598

## 2022-07-03 LAB — BASIC METABOLIC PANEL
Anion gap: 5 (ref 5–15)
BUN: 13 mg/dL (ref 8–23)
CO2: 26 mmol/L (ref 22–32)
Calcium: 8.5 mg/dL — ABNORMAL LOW (ref 8.9–10.3)
Chloride: 106 mmol/L (ref 98–111)
Creatinine, Ser: 0.77 mg/dL (ref 0.61–1.24)
GFR, Estimated: >60 mL/min (ref >60)
Glucose, Bld: 128 mg/dL — ABNORMAL HIGH (ref 70–99)
Potassium: 3.6 mmol/L (ref 3.5–5.1)
Sodium: 137 mmol/L (ref 135–145)

## 2022-07-03 LAB — CBC
HCT: 33.3 % — ABNORMAL LOW (ref 39.0–52.0)
Hemoglobin: 11 g/dL — ABNORMAL LOW (ref 13.0–17.0)
MCH: 25.7 pg — ABNORMAL LOW (ref 26.0–34.0)
MCHC: 33 g/dL (ref 30.0–36.0)
MCV: 77.8 fL — ABNORMAL LOW (ref 80.0–100.0)
Platelets: 209 10*3/uL (ref 150–400)
RBC: 4.28 MIL/uL (ref 4.22–5.81)
RDW: 16.4 % — ABNORMAL HIGH (ref 11.5–15.5)
WBC: 9.5 10*3/uL (ref 4.0–10.5)
nRBC: 0 % (ref 0.0–0.2)

## 2022-07-03 LAB — TRIGLYCERIDES: Triglycerides: 79 mg/dL (ref <150)

## 2022-07-03 LAB — GLUCOSE, CAPILLARY
Glucose-Capillary: 128 mg/dL — ABNORMAL HIGH (ref 70–99)
Glucose-Capillary: 213 mg/dL — ABNORMAL HIGH (ref 70–99)
Glucose-Capillary: 230 mg/dL — ABNORMAL HIGH (ref 70–99)

## 2022-07-03 LAB — MAGNESIUM: Magnesium: 2.1 mg/dL (ref 1.7–2.4)

## 2022-07-03 LAB — PHOSPHORUS: Phosphorus: 2.6 mg/dL (ref 2.5–4.6)

## 2022-07-03 SURGERY — COLECTOMY, WITH COLOSTOMY CREATION
Anesthesia: General | Site: Abdomen

## 2022-07-03 MED ORDER — EPHEDRINE SULFATE (PRESSORS) 50 MG/ML IJ SOLN
INTRAMUSCULAR | Status: DC | PRN
  Administered 2022-07-03: 10 mg via INTRAVENOUS

## 2022-07-03 MED ORDER — SODIUM CHLORIDE 0.9 % IV SOLN
INTRAVENOUS | Status: AC
  Filled 2022-07-03: qty 2

## 2022-07-03 MED ORDER — HYDROMORPHONE HCL 1 MG/ML IJ SOLN
INTRAMUSCULAR | Status: AC
  Filled 2022-07-03: qty 1

## 2022-07-03 MED ORDER — PHENYLEPHRINE HCL (PRESSORS) 10 MG/ML IV SOLN
INTRAVENOUS | Status: AC
  Filled 2022-07-03: qty 1

## 2022-07-03 MED ORDER — ENOXAPARIN SODIUM 40 MG/0.4ML IJ SOSY
40.0000 mg | PREFILLED_SYRINGE | INTRAMUSCULAR | Status: DC
  Administered 2022-07-04 – 2022-07-09 (×6): 40 mg via SUBCUTANEOUS
  Filled 2022-07-03 (×6): qty 0.4

## 2022-07-03 MED ORDER — PHENYLEPHRINE HCL-NACL 20-0.9 MG/250ML-% IV SOLN
INTRAVENOUS | Status: DC | PRN
  Administered 2022-07-03: 30 ug/min via INTRAVENOUS

## 2022-07-03 MED ORDER — PROPOFOL 10 MG/ML IV BOLUS
INTRAVENOUS | Status: DC | PRN
  Administered 2022-07-03: 80 mg via INTRAVENOUS

## 2022-07-03 MED ORDER — SODIUM CHLORIDE FLUSH 0.9 % IV SOLN
INTRAVENOUS | Status: AC
  Filled 2022-07-03: qty 40

## 2022-07-03 MED ORDER — FENTANYL CITRATE (PF) 100 MCG/2ML IJ SOLN
INTRAMUSCULAR | Status: AC
  Filled 2022-07-03: qty 2

## 2022-07-03 MED ORDER — SUGAMMADEX SODIUM 200 MG/2ML IV SOLN
INTRAVENOUS | Status: DC | PRN
  Administered 2022-07-03: 200 mg via INTRAVENOUS

## 2022-07-03 MED ORDER — GABAPENTIN 300 MG PO CAPS
300.0000 mg | ORAL_CAPSULE | Freq: Two times a day (BID) | ORAL | Status: DC
  Administered 2022-07-04 – 2022-07-08 (×10): 300 mg via ORAL
  Filled 2022-07-03 (×10): qty 1

## 2022-07-03 MED ORDER — FENTANYL CITRATE (PF) 100 MCG/2ML IJ SOLN
INTRAMUSCULAR | Status: DC | PRN
  Administered 2022-07-03 (×2): 50 ug via INTRAVENOUS

## 2022-07-03 MED ORDER — HYDROMORPHONE HCL 1 MG/ML IJ SOLN
INTRAMUSCULAR | Status: DC | PRN
  Administered 2022-07-03 (×4): .25 mg via INTRAVENOUS

## 2022-07-03 MED ORDER — SODIUM CHLORIDE 0.9 % IV SOLN
1.0000 g | Freq: Once | INTRAVENOUS | Status: DC
  Filled 2022-07-03 (×3): qty 1

## 2022-07-03 MED ORDER — ROCURONIUM BROMIDE 100 MG/10ML IV SOLN
INTRAVENOUS | Status: DC | PRN
  Administered 2022-07-03 (×2): 20 mg via INTRAVENOUS
  Administered 2022-07-03: 40 mg via INTRAVENOUS
  Administered 2022-07-03: 20 mg via INTRAVENOUS
  Administered 2022-07-03: 5 mg via INTRAVENOUS

## 2022-07-03 MED ORDER — BUPIVACAINE LIPOSOME 1.3 % IJ SUSP
INTRAMUSCULAR | Status: AC
  Filled 2022-07-03: qty 20

## 2022-07-03 MED ORDER — SUCCINYLCHOLINE CHLORIDE 200 MG/10ML IV SOSY
PREFILLED_SYRINGE | INTRAVENOUS | Status: DC | PRN
  Administered 2022-07-03: 80 mg via INTRAVENOUS

## 2022-07-03 MED ORDER — BUPIVACAINE HCL (PF) 0.5 % IJ SOLN
INTRAMUSCULAR | Status: AC
  Filled 2022-07-03: qty 30

## 2022-07-03 MED ORDER — TRAVASOL 10 % IV SOLN
INTRAVENOUS | Status: AC
  Filled 2022-07-03: qty 480

## 2022-07-03 MED ORDER — MIDAZOLAM HCL 2 MG/2ML IJ SOLN
INTRAMUSCULAR | Status: AC
  Filled 2022-07-03: qty 2

## 2022-07-03 MED ORDER — FENTANYL CITRATE (PF) 100 MCG/2ML IJ SOLN
25.0000 ug | INTRAMUSCULAR | Status: DC | PRN
  Administered 2022-07-03 (×2): 50 ug via INTRAVENOUS

## 2022-07-03 MED ORDER — LACTATED RINGERS IV SOLN: INTRAVENOUS | Status: DC | PRN

## 2022-07-03 MED ORDER — CELECOXIB 200 MG PO CAPS
200.0000 mg | ORAL_CAPSULE | Freq: Two times a day (BID) | ORAL | Status: DC
  Administered 2022-07-04 – 2022-07-08 (×10): 200 mg via ORAL
  Filled 2022-07-03 (×12): qty 1

## 2022-07-03 MED ORDER — MORPHINE SULFATE (PF) 2 MG/ML IV SOLN
2.0000 mg | INTRAVENOUS | Status: DC | PRN
  Administered 2022-07-03 – 2022-07-04 (×5): 2 mg via INTRAVENOUS
  Filled 2022-07-03 (×5): qty 1

## 2022-07-03 MED ORDER — SODIUM CHLORIDE (PF) 0.9 % IJ SOLN
INTRAMUSCULAR | Status: DC | PRN
  Administered 2022-07-03: 100 mL

## 2022-07-03 MED ORDER — ONDANSETRON HCL 4 MG/2ML IJ SOLN: 4.0000 mg | Freq: Once | INTRAMUSCULAR | Status: DC | PRN

## 2022-07-03 MED ORDER — SODIUM CHLORIDE 0.9 % IV SOLN
2.0000 g | Freq: Once | INTRAVENOUS | Status: AC
  Administered 2022-07-03: 2 g via INTRAVENOUS
  Filled 2022-07-03: qty 2

## 2022-07-03 MED ORDER — MIDAZOLAM HCL 2 MG/2ML IJ SOLN
INTRAMUSCULAR | Status: DC | PRN
  Administered 2022-07-03 (×2): 1 mg via INTRAVENOUS

## 2022-07-03 MED ORDER — 0.9 % SODIUM CHLORIDE (POUR BTL) OPTIME
TOPICAL | Status: DC | PRN
  Administered 2022-07-03 (×2): 500 mL

## 2022-07-03 MED ORDER — MORPHINE SULFATE (PF) 2 MG/ML IV SOLN
1.0000 mg | INTRAVENOUS | Status: DC | PRN
  Administered 2022-07-03: 1 mg via INTRAVENOUS
  Filled 2022-07-03: qty 1

## 2022-07-03 MED ORDER — PHENYLEPHRINE HCL (PRESSORS) 10 MG/ML IV SOLN
INTRAVENOUS | Status: DC | PRN
  Administered 2022-07-03 (×6): 160 ug via INTRAVENOUS
  Administered 2022-07-03 (×2): 80 ug via INTRAVENOUS

## 2022-07-03 MED ORDER — KCL IN DEXTROSE-NACL 40-5-0.45 MEQ/L-%-% IV SOLN
INTRAVENOUS | Status: AC
  Filled 2022-07-03 (×2): qty 1000

## 2022-07-03 MED ORDER — PROPOFOL 10 MG/ML IV BOLUS
INTRAVENOUS | Status: AC
  Filled 2022-07-03: qty 20

## 2022-07-03 MED ORDER — DEXAMETHASONE SODIUM PHOSPHATE 10 MG/ML IJ SOLN
INTRAMUSCULAR | Status: DC | PRN
  Administered 2022-07-03: 10 mg via INTRAVENOUS

## 2022-07-03 MED ORDER — LIDOCAINE HCL (CARDIAC) PF 100 MG/5ML IV SOSY
PREFILLED_SYRINGE | INTRAVENOUS | Status: DC | PRN
  Administered 2022-07-03: 100 mg via INTRAVENOUS

## 2022-07-03 MED ORDER — SODIUM CHLORIDE 0.9 % IV SOLN
INTRAVENOUS | Status: DC | PRN
  Administered 2022-07-03: 2 g via INTRAVENOUS

## 2022-07-03 MED ORDER — SODIUM CHLORIDE 0.9 % IV SOLN: 2.0000 g | Freq: Once | INTRAVENOUS | Status: DC

## 2022-07-03 SURGICAL SUPPLY — 72 items
APL PRP STRL LF DISP 70% ISPRP (MISCELLANEOUS)
BULB RESERV EVAC DRAIN JP 100C (MISCELLANEOUS) IMPLANT
CELL SAVER LIPIGURD (MISCELLANEOUS) IMPLANT
CHLORAPREP W/TINT 26 (MISCELLANEOUS) ×1 IMPLANT
DEVICE RETRIEVAL ALEXIS 14 (MISCELLANEOUS) IMPLANT
DRAIN CHANNEL JP 15F RND 16 (MISCELLANEOUS) IMPLANT
DRAPE LAPAROTOMY 100X77 ABD (DRAPES) ×1 IMPLANT
DRSG OPSITE POSTOP 4X10 (GAUZE/BANDAGES/DRESSINGS) IMPLANT
DRSG OPSITE POSTOP 4X8 (GAUZE/BANDAGES/DRESSINGS) IMPLANT
DRSG TEGADERM 4X10 (GAUZE/BANDAGES/DRESSINGS) IMPLANT
DRSG TELFA 3X8 NADH STRL (GAUZE/BANDAGES/DRESSINGS) IMPLANT
ELECT BLADE 6.5 EXT (BLADE) ×1 IMPLANT
ELECT CAUTERY BLADE 6.4 (BLADE) ×1 IMPLANT
ELECT REM PT RETURN 9FT ADLT (ELECTROSURGICAL) ×1
ELECTRODE REM PT RTRN 9FT ADLT (ELECTROSURGICAL) ×1 IMPLANT
EXTRT SYSTEM ALEXIS 14CM (MISCELLANEOUS)
EXTRT SYSTEM ALEXIS 17CM (MISCELLANEOUS)
GAUZE 4X4 16PLY ~~LOC~~+RFID DBL (SPONGE) ×1 IMPLANT
GLOVE BIOGEL PI IND STRL 7.0 (GLOVE) ×3 IMPLANT
GLOVE SURG SYN 6.5 ES PF (GLOVE) ×3 IMPLANT
GLOVE SURG SYN 6.5 PF PI (GLOVE) ×3 IMPLANT
GOWN STRL REUS W/ TWL LRG LVL3 (GOWN DISPOSABLE) ×4 IMPLANT
GOWN STRL REUS W/TWL LRG LVL3 (GOWN DISPOSABLE) ×4
HANDLE SUCTION POOLE (INSTRUMENTS) ×1 IMPLANT
HOLDER FOLEY CATH W/STRAP (MISCELLANEOUS) ×1 IMPLANT
KIT OSTOMY DRAINABLE 2.75 STR (WOUND CARE) IMPLANT
KIT TURNOVER KIT A (KITS) ×1 IMPLANT
LABEL OR SOLS (LABEL) ×1 IMPLANT
LIGASURE IMPACT 36 18CM CVD LR (INSTRUMENTS) ×1 IMPLANT
LIGHT WAVEGUIDE WIDE FLAT (MISCELLANEOUS) ×1 IMPLANT
MANIFOLD NEPTUNE II (INSTRUMENTS) ×1 IMPLANT
NEEDLE HYPO 22GX1.5 SAFETY (NEEDLE) ×1 IMPLANT
NS IRRIG 1000ML POUR BTL (IV SOLUTION) ×1 IMPLANT
PACK BASIN MAJOR ARMC (MISCELLANEOUS) ×1 IMPLANT
PACK COLON CLEAN CLOSURE (MISCELLANEOUS) ×1 IMPLANT
RELOAD PROXIMATE 75MM BLUE (ENDOMECHANICALS) ×5 IMPLANT
RELOAD PROXIMATE 75MM GREEN (ENDOMECHANICALS) ×1 IMPLANT
RELOAD PROXIMATE TA60MM BLUE (ENDOMECHANICALS) ×1 IMPLANT
RELOAD STAPLE 60 BLU REG PROX (ENDOMECHANICALS) IMPLANT
RELOAD STAPLE 75 3.8 BLU REG (ENDOMECHANICALS) IMPLANT
RELOAD STAPLE 75 4.5 GRN THCK (ENDOMECHANICALS) IMPLANT
RETRACTOR WND ALEXIS-O 25 LRG (MISCELLANEOUS) IMPLANT
RTRCTR WOUND ALEXIS O 25CM LRG (MISCELLANEOUS)
SOL PREP PVP 2OZ (MISCELLANEOUS) ×1
SOLUTION PREP PVP 2OZ (MISCELLANEOUS) ×1 IMPLANT
SPONGE T-LAP 18X18 ~~LOC~~+RFID (SPONGE) ×3 IMPLANT
STAPLER GUN LINEAR PROX 60 (STAPLE) IMPLANT
STAPLER PROXIMATE 75MM BLUE (STAPLE) IMPLANT
STAPLER SKIN PROX 35W (STAPLE) ×1 IMPLANT
SUCTION POOLE HANDLE (INSTRUMENTS) ×1
SURGILUBE 2OZ TUBE FLIPTOP (MISCELLANEOUS) IMPLANT
SUT ETHILON 3-0 FS-10 30 BLK (SUTURE) ×1
SUT PDS AB 1 TP1 54 (SUTURE) IMPLANT
SUT SILK 2 0 (SUTURE)
SUT SILK 2 0 SH CR/8 (SUTURE) IMPLANT
SUT SILK 2-0 18XBRD TIE 12 (SUTURE) IMPLANT
SUT SILK 3 0 (SUTURE)
SUT SILK 3-0 (SUTURE) ×1 IMPLANT
SUT SILK 3-0 18XBRD TIE 12 (SUTURE) IMPLANT
SUT STRATAFIX PDS 30 CT-1 (SUTURE) IMPLANT
SUT STRATAFIX SPIRAL PDS+ 0 30 (SUTURE) IMPLANT
SUT V-LOC 90 ABS 3-0 VLT  V-20 (SUTURE) ×2
SUT V-LOC 90 ABS 3-0 VLT V-20 (SUTURE) IMPLANT
SUT VIC AB 3-0 SH 27 (SUTURE) ×2
SUT VIC AB 3-0 SH 27X BRD (SUTURE) IMPLANT
SUTURE EHLN 3-0 FS-10 30 BLK (SUTURE) IMPLANT
SYR 20ML LL LF (SYRINGE) ×1 IMPLANT
SYSTEM CONTND EXTRCTN KII BLLN (MISCELLANEOUS) IMPLANT
TOWEL OR 17X26 4PK STRL BLUE (TOWEL DISPOSABLE) ×1 IMPLANT
TRAP FLUID SMOKE EVACUATOR (MISCELLANEOUS) ×1 IMPLANT
TRAY FOLEY SLVR 16FR LF STAT (SET/KITS/TRAYS/PACK) ×1 IMPLANT
WATER STERILE IRR 500ML POUR (IV SOLUTION) ×1 IMPLANT

## 2022-07-03 NOTE — Interval H&P Note (Signed)
No change. OK to proceed.

## 2022-07-03 NOTE — Anesthesia Preprocedure Evaluation (Signed)
Anesthesia Evaluation  Patient identified by MRN, date of birth, ID band Patient awake    Reviewed: Allergy & Precautions, NPO status , Patient's Chart, lab work & pertinent test results  History of Anesthesia Complications Negative for: history of anesthetic complications  Airway Mallampati: IV   Neck ROM: Full    Dental  (+) Dental Advidsory Given, Poor Dentition   Pulmonary neg pulmonary ROS   Pulmonary exam normal breath sounds clear to auscultation       Cardiovascular Exercise Tolerance: Good negative cardio ROS Normal cardiovascular exam Rhythm:Regular Rate:Normal     Neuro/Psych neg Seizures  Neuromuscular disease (neuropathy)  negative psych ROS   GI/Hepatic negative GI ROS, Neg liver ROS,,,Colon CA s/p resection 2011 with ostomy   Endo/Other  negative endocrine ROS    Renal/GU negative Renal ROS     Musculoskeletal  (+) Arthritis ,    Abdominal   Peds  Hematology negative hematology ROS (+)   Anesthesia Other Findings Past Medical History: No date: Colon cancer ([place])     Comment:  remission 2011 No date: Neuropathy   Reproductive/Obstetrics                             Anesthesia Physical Anesthesia Plan  ASA: 2  Anesthesia Plan: General   Post-op Pain Management:    Induction: Intravenous, Rapid sequence and Cricoid pressure planned  PONV Risk Score and Plan: 2 and Treatment may vary due to age or medical condition, Ondansetron and Dexamethasone  Airway Management Planned: Oral ETT  Additional Equipment:   Intra-op Plan:   Post-operative Plan: Extubation in OR  Informed Consent: I have reviewed the patients History and Physical, chart, labs and discussed the procedure including the risks, benefits and alternatives for the proposed anesthesia with the patient or authorized representative who has indicated his/her understanding and acceptance.     Dental  advisory given  Plan Discussed with: CRNA  Anesthesia Plan Comments: (Patient consented for risks of anesthesia including but not limited to:  - adverse reactions to medications - damage to eyes, teeth, lips or other oral mucosa - nerve damage due to positioning  - sore throat or hoarseness - damage to heart, brain, nerves, lungs, other parts of body or loss of life  Informed patient about role of CRNA in peri- and intra-operative care.  Patient voiced understanding.)       Anesthesia Quick Evaluation

## 2022-07-03 NOTE — Plan of Care (Signed)

## 2022-07-03 NOTE — Anesthesia Postprocedure Evaluation (Signed)
Anesthesia Post Note  Patient: [name]  Procedure(s) Performed: COLECTOMY WITH COLOSTOMY CREATION/HARTMANN PROCEDURE (Abdomen)  Patient location during evaluation: PACU Anesthesia Type: General Level of consciousness: awake and alert Pain management: pain level controlled Vital Signs Assessment: post-procedure vital signs reviewed and stable Respiratory status: spontaneous breathing, nonlabored ventilation, respiratory function stable and patient connected to nasal cannula oxygen Cardiovascular status: blood pressure returned to baseline and stable Postop Assessment: no apparent nausea or vomiting Anesthetic complications: no   No notable events documented.   Last Vitals:  Vitals:   07/03/22 1716 07/03/22 1745  BP: (!) 145/90 (!) 149/86  Pulse: 77 70  Resp: 19 20  Temp:  36.8 C  SpO2: 95% 93%    Last Pain:  Vitals:   07/03/22 1745  TempSrc: Oral  PainSc: 2                  Precious Haws Quintasha Gren

## 2022-07-03 NOTE — Anesthesia Procedure Notes (Addendum)
Procedure Name: Intubation Date/Time: 07/03/2022 1:02 PM  Performed by: Gigi Gin, CRNAPre-anesthesia Checklist: Patient identified, Emergency Drugs available, Suction available, Patient being monitored and Timeout performed Oxygen Delivery Method: Circle system utilized Preoxygenation: Pre-oxygenation with 100% oxygen Induction Type: IV induction, Rapid sequence and Cricoid Pressure applied Grade View: Grade II Tube type: Oral Tube size: 7.5 mm Number of attempts: 1 Airway Equipment and Method: Stylet Placement Confirmation: ETT inserted through vocal cords under direct vision, positive ETCO2, CO2 detector and breath sounds checked- equal and bilateral Secured at: 23 cm Tube secured with: Tape (right lip) Dental Injury: Teeth and Oropharynx as per pre-operative assessment

## 2022-07-03 NOTE — Transfer of Care (Signed)
Immediate Anesthesia Transfer of Care Note  Patient: [name]  Procedure(s) Performed: COLECTOMY WITH COLOSTOMY CREATION/HARTMANN PROCEDURE (Abdomen)  Patient Location: PACU  Anesthesia Type:General  Level of Consciousness: awake, alert , oriented, and patient cooperative  Airway & Oxygen Therapy: Patient Spontanous Breathing and Patient connected to face mask oxygen  Post-op Assessment: Report given to RN and Post -op Vital signs reviewed and stable  Post vital signs: Reviewed and stable  Last Vitals:  Vitals Value Taken Time  BP 148/81 07/03/22 1630  Temp 37.2 C 07/03/22 1627  Pulse 77 07/03/22 1636  Resp 18 07/03/22 1636  SpO2 100 % 07/03/22 1636  Vitals shown include unvalidated device data.  Last Pain:  Vitals:   07/03/22 1627  TempSrc:   PainSc: Asleep      Patients Stated Pain Goal: 3 (26/37/85 8850)  Complications: No notable events documented.

## 2022-07-03 NOTE — Progress Notes (Signed)
PHARMACY - TOTAL PARENTERAL NUTRITION CONSULT NOTE   Indication: Small bowel obstruction  Patient Measurements: Height: '5\' 8"'$  (172.7 cm) Weight: 61.8 kg (136 lb 3.9 oz) IBW/kg (Calculated) : 68.4 TPN AdjBW (KG): 73.9 Body mass index is 20.72 kg/m. Usual Weight: 74 kg  Assessment:  71 year old male PMH including polyneuropathy, rectal cancer s/p abdominoperineal proctocolectomy and abdominal perineal resection 2011 s/p chemo and XRT admitted with small bowel obstruction. Pharmacy has been consulted to start and manage TPN.  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 Plan to advance to goal rate tomorrow 12/14. Electrolytes in TPN: Na 79mq/L, K 5566m/L, Ca 66m66mL, Mg 66mE33m, and Phos 166mm24m. Cl:Ac 1:1 Add standard MVI and trace elements to TPN Thiamine 100 mg day 2/3 Initiate Sensitive q8h SSI and adjust as needed  Reduce MIVF to 50 mL/hr (D5-1/2NS with Kcl 40) at 1800 in order to achieve goal fluid balance. Consider further reduction after TPN advanced to goal rate. Consider reducing or removing Kcl from IVF 12/14 if K+ continues to improve. Monitor TPN labs on Mon/Thurs.   CarolGlean SalvormD, BCPS Clinical Pharmacist  07/03/2022 7:25 AM

## 2022-07-04 ENCOUNTER — Encounter: Payer: Self-pay | Admitting: Surgery

## 2022-07-04 LAB — COMPREHENSIVE METABOLIC PANEL
ALT: 12 U/L (ref 0–44)
AST: 18 U/L (ref 15–41)
Albumin: 3.1 g/dL — ABNORMAL LOW (ref 3.5–5.0)
Alkaline Phosphatase: 62 U/L (ref 38–126)
Anion gap: 5 (ref 5–15)
BUN: 16 mg/dL (ref 8–23)
CO2: 24 mmol/L (ref 22–32)
Calcium: 8.8 mg/dL — ABNORMAL LOW (ref 8.9–10.3)
Chloride: 107 mmol/L (ref 98–111)
Creatinine, Ser: 0.92 mg/dL (ref 0.61–1.24)
GFR, Estimated: >60 mL/min (ref >60)
Glucose, Bld: 150 mg/dL — ABNORMAL HIGH (ref 70–99)
Potassium: 4.2 mmol/L (ref 3.5–5.1)
Sodium: 136 mmol/L (ref 135–145)
Total Bilirubin: 0.6 mg/dL (ref 0.3–1.2)
Total Protein: 6.6 g/dL (ref 6.5–8.1)

## 2022-07-04 LAB — GLUCOSE, CAPILLARY
Glucose-Capillary: 124 mg/dL — ABNORMAL HIGH (ref 70–99)
Glucose-Capillary: 129 mg/dL — ABNORMAL HIGH (ref 70–99)
Glucose-Capillary: 135 mg/dL — ABNORMAL HIGH (ref 70–99)
Glucose-Capillary: 135 mg/dL — ABNORMAL HIGH (ref 70–99)
Glucose-Capillary: 138 mg/dL — ABNORMAL HIGH (ref 70–99)
Glucose-Capillary: 141 mg/dL — ABNORMAL HIGH (ref 70–99)
Glucose-Capillary: 170 mg/dL — ABNORMAL HIGH (ref 70–99)

## 2022-07-04 LAB — PHOSPHORUS: Phosphorus: 1.8 mg/dL — ABNORMAL LOW (ref 2.5–4.6)

## 2022-07-04 LAB — CBC
HCT: 35.1 % — ABNORMAL LOW (ref 39.0–52.0)
Hemoglobin: 11.5 g/dL — ABNORMAL LOW (ref 13.0–17.0)
MCH: 25.7 pg — ABNORMAL LOW (ref 26.0–34.0)
MCHC: 32.8 g/dL (ref 30.0–36.0)
MCV: 78.3 fL — ABNORMAL LOW (ref 80.0–100.0)
Platelets: 243 10*3/uL (ref 150–400)
RBC: 4.48 MIL/uL (ref 4.22–5.81)
RDW: 16.8 % — ABNORMAL HIGH (ref 11.5–15.5)
WBC: 16.4 10*3/uL — ABNORMAL HIGH (ref 4.0–10.5)
nRBC: 0 % (ref 0.0–0.2)

## 2022-07-04 LAB — MAGNESIUM: Magnesium: 2.2 mg/dL (ref 1.7–2.4)

## 2022-07-04 LAB — TRIGLYCERIDES: Triglycerides: 57 mg/dL (ref <150)

## 2022-07-04 MED ORDER — SODIUM CHLORIDE 0.9 % IV SOLN: INTRAVENOUS | Status: DC

## 2022-07-04 MED ORDER — PANTOPRAZOLE SODIUM 40 MG IV SOLR
40.0000 mg | INTRAVENOUS | Status: DC
  Administered 2022-07-04 – 2022-07-08 (×5): 40 mg via INTRAVENOUS
  Filled 2022-07-04 (×5): qty 10

## 2022-07-04 MED ORDER — MAGNESIUM SULFATE 2 GM/50ML IV SOLN
2.0000 g | Freq: Once | INTRAVENOUS | Status: AC
  Administered 2022-07-04: 2 g via INTRAVENOUS
  Filled 2022-07-04: qty 50

## 2022-07-04 MED ORDER — POTASSIUM PHOSPHATES 15 MMOLE/5ML IV SOLN
15.0000 mmol | Freq: Once | INTRAVENOUS | Status: AC
  Administered 2022-07-04: 15 mmol via INTRAVENOUS
  Filled 2022-07-04: qty 5

## 2022-07-04 MED ORDER — TRACE MINERALS CU-MN-SE-ZN 300-55-60-3000 MCG/ML IV SOLN
INTRAVENOUS | Status: AC
  Filled 2022-07-04: qty 572

## 2022-07-04 NOTE — Progress Notes (Signed)
Order received from Dr Sakai to discontinue telemetry 

## 2022-07-04 NOTE — Op Note (Signed)
Pre-Op Dx: Obstructing colon cancer Post-Op Dx: Same Anesthesia: GETA EBL:  Complications:  none apparent Specimen: Colon cancer  procedure: Exploratory laparotomy, en bloc colon resection, reanastomosis, primary repair of parastomal hernia  Surgeon: Lysle Pearl  Findings: easily palpable recurrent colon Cancer with extension of mass to omentum as well as adjacent small bowel. Large parastomal hernia   Indications for procedure: See progress notes. Description of Procedure:  Consent obtained, time out performed.  Patient placed in supine position.  Foley placed.  Ostomy covered with 4 x 4 and Tegaderm.  Area sterilized and draped in usual position.  Periumbilical midline incision made and dissection carried down to fascia.  Fascia then incised and peritoneum entered.  Inspection of abdominal cavity noted large parastomal hernia, with adequate length of normal colon extending from ostomy to the previously inked and easily palpable obstructing recurrent colon cancer.  The cancer seems to be extending beyond the serosa and attaching to the surrounding omentum as well as small bowel.  Dilated colon noted more proximal to this area.  No additional palpable or visualized abnormalities noted within the peritoneal cavity including the liver.  The omentum was transected at an area of normal looking and soft portion using LigaSure to isolate the omentum that was attached to the mass.  Several centimeters both proximal and distal to the loop of small bowel that was adhered to the mass was located and holes created in the mesentery.  75 cm blue load stapler x 2 was then used to transect the small bowel.  LigaSure was used to then transect the mesentery attached to the loop of bowel stuck to the mass.    The proximal small bowel and distal small bowel loop was then reanastomosed using a side-to-side anastomosis.  Enterotomies were made at the end of the antimesenteric staple line and a 75 mm blue load stapler was  used to create a side-to-side anastomosis.  The enterotomy was then closed with 60 mm blue load TA stapler.  The staple line was reinforced with 3-0 silk in a running Lembert fashion.  6 cm proximal and distal to the mass then chosen on the colon.  Hole created in the the mesentery at these points and a 75 mm blue load stapler was used to transect the distal decompressed side of the colon, 75 mm green load stapler used to transect the dilated proximal colon.  Mesentery associated with the colon including the mass was then transected near its base and the specimen was passed off the operative field pending pathology.  The proximal and distal colon was then reanastomosed using a side-to-side anastomosis.  Colotomies were made at the end of the antimesenteric staple line and a 75 mm blue load stapler was used to create a side-to-side anastomosis.  The colotomy was then closed with 60 mm blue load TA stapler.  The staple line was reinforced with 3-0 silk in a running Lembert fashion.  Care taken to make sure this new anastomosis did not place any undue tension on the distal and which was running towards the previously placed end colostomy.  Due to the multiple bowel resections needed to complete this procedure, decision was made to forego any mesh placement around the asymptomatic parastomal hernia and proceed with primary repair for now.  The excess hernia sac and contents were easily reduced from within abdominal cavity.  0 strata fix then used to close the parastomal hernia defect to the point where only 1 finger easily able to be passed through the  fascial defect along with the previously created end colostomy.   50 French round drain placed over the anastomosis sites and secured to the skin from the left upper quadrant using 3-0 nylon.  Clean closure protocol initiated.  Exparel infused along midline incision.  1-0 PDS x2 then used to close the midline incision.  Skin approximated using interrupted 3-0  Vicryl.  Skin then closed with staples.  Wound then dressed with honeycomb dressing and drain sponge at the drain site.  Pt tolerated procedure well, and transferred to PACU in stable condition, Foley and NG in place.  Sponge and instrument count correct at end of procedure.

## 2022-07-04 NOTE — Plan of Care (Signed)

## 2022-07-04 NOTE — Progress Notes (Addendum)
Subjective:  CC: [name] is a 71 y.o. male  Hospital stay day 5, 1 Day Post-Op ex lap, en bloc colon resection, including small bowel, reanastomosis x2, primary repair of parastomal hernia  HPI: No issues overnight.  ROS:  General: Denies weight loss, weight gain, fatigue, fevers, chills, and night sweats. Heart: Denies chest pain, palpitations, racing heart, irregular heartbeat, leg pain or swelling, and decreased activity tolerance. Respiratory: Denies breathing difficulty, shortness of breath, wheezing, cough, and sputum. GI: Denies change in appetite, heartburn, nausea, vomiting, constipation, diarrhea, and blood in stool. GU: Denies difficulty urinating, pain with urinating, urgency, frequency, blood in urine.   Objective:   Temp:  [97 F (36.1 C)-99.1 F (37.3 C)] 98.4 F (36.9 C) (12/14 1554) Pulse Rate:  [69-88] 88 (12/14 1554) Resp:  [15-24] 18 (12/14 1554) BP: (141-166)/(81-94) 146/90 (12/14 1554) SpO2:  [93 %-100 %] 97 % (12/14 1554) Weight:  [71 kg] 71 kg (12/14 0335)     Height: '5\' 8"'$  (172.7 cm) Weight: 71 kg BMI (Calculated): 23.81   Intake/Output this shift:   Intake/Output Summary (Last 24 hours) at 07/04/2022 1625 Last data filed at 07/04/2022 1431 Gross per 24 hour  Intake 1855.96 ml  Output 2430 ml  Net -574.04 ml    Constitutional :  alert, cooperative, appears stated age, and no distress  Respiratory:  clear to auscultation bilaterally  Cardiovascular:  regular rate and rhythm  Gastrointestinal: soft, non-tender; bowel sounds normal; no masses,  no organomegaly.  No ostomy output, JP with serosanguineous output  Skin: Cool and moist.  Midline incision clean dry and intact  Psychiatric: Normal affect, non-agitated, not confused       LABS:     Latest Ref Rng & Units 07/04/2022    4:54 AM 07/03/2022    5:17 AM 07/02/2022    5:40 AM  CMP  Glucose 70 - 99 mg/dL 150  128  96   BUN 8 - 23 mg/dL '16  13  14   '$ Creatinine 0.61 - 1.24 mg/dL  0.92  0.77  0.78   Sodium 135 - 145 mmol/L 136  137  136   Potassium 3.5 - 5.1 mmol/L 4.2  3.6  3.1   Chloride 98 - 111 mmol/L 107  106  104   CO2 22 - 32 mmol/L '24  26  24   '$ Calcium 8.9 - 10.3 mg/dL 8.8  8.5  8.5   Total Protein 6.5 - 8.1 g/dL 6.6   6.1   Total Bilirubin 0.3 - 1.2 mg/dL 0.6   0.9   Alkaline Phos 38 - 126 U/L 62   62   AST 15 - 41 U/L 18   14   ALT 0 - 44 U/L 12   10       Latest Ref Rng & Units 07/04/2022    4:54 AM 07/03/2022    5:17 AM 07/02/2022    5:40 AM  CBC  WBC 4.0 - 10.5 K/uL 16.4  9.5  9.1   Hemoglobin 13.0 - 17.0 g/dL 11.5  11.0  10.9   Hematocrit 39.0 - 52.0 % 35.1  33.3  33.1   Platelets 150 - 400 K/uL 243  209  226     Assessment:   Partially obstructing colon mass status post colonoscopy and biopsy.  Subsequent ex lap, en bloc colon resection, including small bowel, reanastomosis x2, primary repair of parastomal hernia  As expected.  Discontinue Foley.  Continue NG decompression until return of bowel function.  Start PPI for brown NG output.  6 labs/images/medications/previous chart entries reviewed personally and relevant changes/updates noted above.

## 2022-07-04 NOTE — Progress Notes (Addendum)
PHARMACY - TOTAL PARENTERAL NUTRITION CONSULT NOTE   Indication: Small bowel obstruction  Patient Measurements: Height: '5\' 8"'$  (172.7 cm) Weight: 71 kg (156 lb 8.4 oz) IBW/kg (Calculated) : 68.4 TPN AdjBW (KG): 73.9 Body mass index is 23.8 kg/m. Usual Weight: 74 kg  Assessment:  71 year old male PMH including polyneuropathy, rectal cancer s/p abdominoperineal proctocolectomy and abdominal perineal resection 2011 s/p chemo and XRT admitted with small bowel obstruction. Now s/p Hartmann's procedure 12/13. Pharmacy has been consulted to start and manage TPN.  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  Electrolytes in TPN: Na 24mq/L, K 558m/L, Ca 75m14mL, Mg 75mE64m, and Phos 175mm33m. Cl:Ac 1:1  Replacing with IV Magnesium sulfate 2 grams outside of TPN. If Mag low again tomorrow, will consider adjusting Mg concentration in TPN. Give IV Kphos 15 mmol x 1 Add standard MVI and trace elements to TPN Thiamine 100 mg day 3/3 Change fluids to Normal Saline @ 25 mL/hr '@1800'$  Continue Sensitive q8h SSI and adjust as needed. Suspect CBG's will  decline after removing dextrose from IVF.  Monitor TPN labs on Mon/Thurs. BMP with Mag again tomorrow AM.   CarolGlean SalvormD, BCPS Clinical Pharmacist  07/04/2022 8:00 AM

## 2022-07-04 NOTE — TOC Initial Note (Signed)
Transition of Care [facility] [place]) - Initial/Assessment Note    Patient Details  Name: [name] MRN: [id number] Date of Birth: July 30, 1950  Transition of Care [facility]) CM/SW Contact:    Nathan Chroman, LCSW Phone Number: 07/04/2022, 10:17 AM  Clinical Narrative:    CSW met with patient. No supports at bedside. CSW introduced role and explained that discharge planning would be discussed. Offered to look into a home health RN for colostomy management. Patient stated he has had a colostomy for 12 years so he does not feel like he needs one. He will notify RN if he changes his mind. Per RN, nightshift said patient requires standby assist. No further concerns. CSW encouraged patient to contact CSW as needed. CSW will continue to follow patient for support and facilitate return home once stable. Girlfriend, [name], will transport him home at discharge.              Expected Discharge Plan: Home/Self Care Barriers to Discharge: Continued Medical Work up   Patient Goals and CMS Choice        Expected Discharge Plan and Services Expected Discharge Plan: Home/Self Care     Post Acute Care Choice: NA Living arrangements for the past 2 months: Single Family Home                                      Prior Living Arrangements/Services Living arrangements for the past 2 months: Single Family Home   Patient language and need for interpreter reviewed:: Yes Do you feel safe going back to the place where you live?: Yes      Need for Family Participation in Patient Care: Yes (Comment)     Criminal Activity/Legal Involvement Pertinent to Current Situation/Hospitalization: No - Comment as needed  Activities of Daily Living Home Assistive Devices/Equipment: None ADL Screening (condition at time of admission) Patient's cognitive ability adequate to safely complete daily activities?: Yes Is the patient deaf or have difficulty hearing?: No Does the patient have difficulty seeing, even when  wearing glasses/contacts?: No Does the patient have difficulty concentrating, remembering, or making decisions?: No Patient able to express need for assistance with ADLs?: Yes Does the patient have difficulty dressing or bathing?: No Independently performs ADLs?: Yes (appropriate for developmental age) Does the patient have difficulty walking or climbing stairs?: No Weakness of Legs: None Weakness of Arms/Hands: None  Permission Sought/Granted                  Emotional Assessment Appearance:: Appears stated age Attitude/Demeanor/Rapport: Engaged, Gracious Affect (typically observed): Appropriate, Calm, Pleasant Orientation: : Oriented to Self, Oriented to Place, Oriented to  Time, Oriented to Situation Alcohol / Substance Use: Not Applicable Psych Involvement: No (comment)  Admission diagnosis:  Colonic obstruction ([place]) [K56.609] Colonic mass [K63.89] Patient Active Problem List   Diagnosis Date Noted   Protein-calorie malnutrition, severe 07/01/2022   Colonic mass 06/29/2022   RECTAL MASS 08/11/2009   PCP:  Juluis Pitch, MD Pharmacy:  No Pharmacies Listed    Social Determinants of Health (SDOH) Interventions Food Insecurity Interventions: Intervention Not Indicated  Readmission Risk Interventions     No data to display

## 2022-07-05 LAB — TYPE AND SCREEN
ABO/RH(D): O NEG
Antibody Screen: POSITIVE
PT AG Type: POSITIVE
Unit division: 0
Unit division: 0

## 2022-07-05 LAB — BASIC METABOLIC PANEL
Anion gap: 5 (ref 5–15)
BUN: 20 mg/dL (ref 8–23)
CO2: 26 mmol/L (ref 22–32)
Calcium: 8.6 mg/dL — ABNORMAL LOW (ref 8.9–10.3)
Chloride: 109 mmol/L (ref 98–111)
Creatinine, Ser: 0.93 mg/dL (ref 0.61–1.24)
GFR, Estimated: >60 mL/min (ref >60)
Glucose, Bld: 112 mg/dL — ABNORMAL HIGH (ref 70–99)
Potassium: 4.3 mmol/L (ref 3.5–5.1)
Sodium: 140 mmol/L (ref 135–145)

## 2022-07-05 LAB — BPAM RBC
Blood Product Expiration Date: 202312242359
Blood Product Expiration Date: 202401032359
Unit Type and Rh: 5100
Unit Type and Rh: 9500

## 2022-07-05 LAB — GLUCOSE, CAPILLARY
Glucose-Capillary: 114 mg/dL — ABNORMAL HIGH (ref 70–99)
Glucose-Capillary: 120 mg/dL — ABNORMAL HIGH (ref 70–99)
Glucose-Capillary: 129 mg/dL — ABNORMAL HIGH (ref 70–99)
Glucose-Capillary: 106 mg/dL — ABNORMAL HIGH (ref 70–99)
Glucose-Capillary: 122 mg/dL — ABNORMAL HIGH (ref 70–99)
Glucose-Capillary: 123 mg/dL — ABNORMAL HIGH (ref 70–99)

## 2022-07-05 LAB — MAGNESIUM: Magnesium: 2.4 mg/dL (ref 1.7–2.4)

## 2022-07-05 LAB — PHOSPHORUS: Phosphorus: 3.2 mg/dL (ref 2.5–4.6)

## 2022-07-05 MED ORDER — TRACE MINERALS CU-MN-SE-ZN 300-55-60-3000 MCG/ML IV SOLN
INTRAVENOUS | Status: AC
  Filled 2022-07-05: qty 572

## 2022-07-05 NOTE — Progress Notes (Signed)
Subjective:  CC: [name] is a 71 y.o. male  Hospital stay day 6, 2 Days Post-Op ex lap, en bloc colon resection, including small bowel, reanastomosis x2, primary repair of parastomal hernia  HPI: No issues overnight.  Still no ostomy output.  ROS:  General: Denies weight loss, weight gain, fatigue, fevers, chills, and night sweats. Heart: Denies chest pain, palpitations, racing heart, irregular heartbeat, leg pain or swelling, and decreased activity tolerance. Respiratory: Denies breathing difficulty, shortness of breath, wheezing, cough, and sputum. GI: Denies change in appetite, heartburn, nausea, vomiting, constipation, diarrhea, and blood in stool. GU: Denies difficulty urinating, pain with urinating, urgency, frequency, blood in urine.   Objective:   Temp:  [98.4 F (36.9 C)-100.2 F (37.9 C)] 98.5 F (36.9 C) (12/15 0829) Pulse Rate:  [70-88] 70 (12/15 0829) Resp:  [16-20] 16 (12/15 0829) BP: (146-162)/(85-94) 154/85 (12/15 0829) SpO2:  [92 %-97 %] 96 % (12/15 0829)     Height: '5\' 8"'$  (172.7 cm) Weight: 71 kg BMI (Calculated): 23.81   Intake/Output this shift:   Intake/Output Summary (Last 24 hours) at 07/05/2022 0931 Last data filed at 07/05/2022 0600 Gross per 24 hour  Intake 1148.73 ml  Output 1035 ml  Net 113.73 ml    Constitutional :  alert, cooperative, appears stated age, and no distress  Respiratory:  clear to auscultation bilaterally  Cardiovascular:  regular rate and rhythm  Gastrointestinal: soft, non-tender; bowel sounds normal; no masses,  no organomegaly.  No ostomy output, JP with serosanguineous output  Skin: Cool and moist.  Midline incision clean dry and intact  Psychiatric: Normal affect, non-agitated, not confused       LABS:     Latest Ref Rng & Units 07/05/2022    4:55 AM 07/04/2022    4:54 AM 07/03/2022    5:17 AM  CMP  Glucose 70 - 99 mg/dL 112  150  128   BUN 8 - 23 mg/dL '20  16  13   '$ Creatinine 0.61 - 1.24 mg/dL 0.93  0.92   0.77   Sodium 135 - 145 mmol/L 140  136  137   Potassium 3.5 - 5.1 mmol/L 4.3  4.2  3.6   Chloride 98 - 111 mmol/L 109  107  106   CO2 22 - 32 mmol/L '26  24  26   '$ Calcium 8.9 - 10.3 mg/dL 8.6  8.8  8.5   Total Protein 6.5 - 8.1 g/dL  6.6    Total Bilirubin 0.3 - 1.2 mg/dL  0.6    Alkaline Phos 38 - 126 U/L  62    AST 15 - 41 U/L  18    ALT 0 - 44 U/L  12        Latest Ref Rng & Units 07/04/2022    4:54 AM 07/03/2022    5:17 AM 07/02/2022    5:40 AM  CBC  WBC 4.0 - 10.5 K/uL 16.4  9.5  9.1   Hemoglobin 13.0 - 17.0 g/dL 11.5  11.0  10.9   Hematocrit 39.0 - 52.0 % 35.1  33.3  33.1   Platelets 150 - 400 K/uL 243  209  226     Assessment:   Partially obstructing colon mass status post colonoscopy and biopsy.  Subsequent ex lap, en bloc colon resection, including small bowel, reanastomosis x2, primary repair of parastomal hernia  Likely postop ileus.  Foley is out.  Continue gum and hard candy and encourage ambulation.  Resume diet once ostomy output  is noted.  abs/images/medications/previous chart entries reviewed personally and relevant changes/updates noted above.

## 2022-07-05 NOTE — Progress Notes (Addendum)
PHARMACY - TOTAL PARENTERAL NUTRITION CONSULT NOTE   Indication: Small bowel obstruction  Patient Measurements: Height: '5\' 8"'$  (172.7 cm) Weight: 71 kg (156 lb 8.4 oz) IBW/kg (Calculated) : 68.4 TPN AdjBW (KG): 73.9 Body mass index is 23.8 kg/m. Usual Weight: 74 kg  Assessment:  71 year old male PMH including polyneuropathy, rectal cancer s/p abdominoperineal proctocolectomy and abdominal perineal resection 2011 s/p chemo and XRT admitted with small bowel obstruction. Now s/p Hartmann's procedure 12/13. Pharmacy has been consulted to start and manage TPN.  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  Electrolytes in TPN: Na 43mq/L, K 5277m/L, Ca 77m43mL, Mg 77mE72m, and Phos 177mm13m. Cl:Ac 1:1 Mag and Phos repleted. WNL. Add standard MVI and trace elements to TPN Continue Normal Saline @ 25 mL/hr '@1800'$  Continue Sensitive q8h SSI and adjust as needed.  Monitor TPN labs on Mon/Thurs. BMP with Mag again tomorrow AM.   CarolGlean SalvormD, BCPS Clinical Pharmacist  07/05/2022 8:13 AM

## 2022-07-05 NOTE — Care Management Important Message (Signed)
Important Message  Patient Details  Name: [name] MRN: [id number] Date of Birth: [birth year]   Medicare Important Message Given:  Yes     Dannette Barbara 07/05/2022, 11:55 AM

## 2022-07-06 DIAGNOSIS — K6389 Other specified diseases of intestine: Secondary | ICD-10-CM | POA: Diagnosis not present

## 2022-07-06 LAB — GLUCOSE, CAPILLARY
Glucose-Capillary: 125 mg/dL — ABNORMAL HIGH (ref 70–99)
Glucose-Capillary: 114 mg/dL — ABNORMAL HIGH (ref 70–99)
Glucose-Capillary: 123 mg/dL — ABNORMAL HIGH (ref 70–99)
Glucose-Capillary: 121 mg/dL — ABNORMAL HIGH (ref 70–99)
Glucose-Capillary: 99 mg/dL (ref 70–99)
Glucose-Capillary: 102 mg/dL — ABNORMAL HIGH (ref 70–99)

## 2022-07-06 LAB — BASIC METABOLIC PANEL
Anion gap: 5 (ref 5–15)
BUN: 26 mg/dL — ABNORMAL HIGH (ref 8–23)
CO2: 25 mmol/L (ref 22–32)
Calcium: 8.4 mg/dL — ABNORMAL LOW (ref 8.9–10.3)
Chloride: 108 mmol/L (ref 98–111)
Creatinine, Ser: 0.83 mg/dL (ref 0.61–1.24)
GFR, Estimated: >60 mL/min (ref >60)
Glucose, Bld: 109 mg/dL — ABNORMAL HIGH (ref 70–99)
Potassium: 4.4 mmol/L (ref 3.5–5.1)
Sodium: 138 mmol/L (ref 135–145)

## 2022-07-06 LAB — MAGNESIUM: Magnesium: 2.2 mg/dL (ref 1.7–2.4)

## 2022-07-06 LAB — PHOSPHORUS: Phosphorus: 3.6 mg/dL (ref 2.5–4.6)

## 2022-07-06 MED ORDER — TRACE MINERALS CU-MN-SE-ZN 300-55-60-3000 MCG/ML IV SOLN
INTRAVENOUS | Status: AC
  Filled 2022-07-06: qty 572

## 2022-07-06 NOTE — Progress Notes (Signed)
07/06/2022  Subjective: No acute events overnight.  Patient reports his pain is controlled.  He started to have flatus in the bag and the bag has been burped today already.  No stool yet.  NG with 450 mL output.  Vital signs: Temp:  [98.2 F (36.8 C)-98.9 F (37.2 C)] 98.9 F (37.2 C) (12/16 0741) Pulse Rate:  [66-79] 66 (12/16 0741) Resp:  [18] 18 (12/16 0741) BP: (133-144)/(73-94) 133/73 (12/16 0741) SpO2:  [97 %-98 %] 98 % (12/16 0741) Weight:  [71 kg] 71 kg (12/16 0455)   Intake/Output: 12/15 0701 - 12/16 0700 In: 1930.3 [I.V.:1830.3; NG/GT:100] Out: 1212 [Urine:750; Emesis/NG output:450; Drains:12]    Physical Exam: Constitutional: No acute distress, well-nourished Abdomen: Soft, nondistended, appropriately tender to palpation.  Midline incision is clean, dry, intact with staples.  Ostomy pink and viable with some gas in the bag but no stool yet.  Labs:  Recent Labs    07/04/22 0454  WBC 16.4*  HGB 11.5*  HCT 35.1*  PLT 243   Recent Labs    07/05/22 0455 07/06/22 0442  NA 140 138  K 4.3 4.4  CL 109 108  CO2 26 25  GLUCOSE 112* 109*  BUN 20 26*  CREATININE 0.93 0.83  CALCIUM 8.6* 8.4*   No results for input(s): "LABPROT", "INR" in the last 72 hours.  Imaging: No results found.  Assessment/Plan: This is a 71 y.o. male status post exploratory laparotomy with en bloc colon and small bowel resection.  - Will do a clamping trial of his NG tube this morning.  If low residuals, will be able to DC NG tube and start him on a clear liquid diet.  Otherwise discussed with the patient that we will resume suction and repeat this tomorrow. - Continue IV fluids for hydration. - Out of bed, ambulate as tolerated.   I spent 35 minutes dedicated to the care of this patient on the date of this encounter to include pre-visit review of records, face-to-face time with the patient discussing diagnosis and management, and any post-visit coordination of care.  Melvyn Neth, [facility]

## 2022-07-06 NOTE — Progress Notes (Signed)
End of shift note:  NG tube was removed at 1500 and pt was advanced to a clear liquid diet. Prn pain medication was given for abdominal pain. JP drain's output was 10cc during this shift. Pt tolerating PO intake and voiding well. Continues on TPN

## 2022-07-06 NOTE — Progress Notes (Signed)
Mobility Specialist - Progress Note    07/06/22 1000  Mobility  Activity Ambulated with assistance in hallway;Stood at bedside  Level of Assistance Standby assist, set-up cues, supervision of patient - no hands on  Assistive Device Front wheel walker  Distance Ambulated (ft) 180 ft  Activity Response Tolerated well  Mobility Referral Yes  $Mobility charge 1 Mobility   Pt resting in bed on RA upon entry. Pt STS and ambulates SBA with RW around NS. Pt returned to recliner and left with needs in reach. RN notified .   Loma Sender Mobility Specialist 07/06/22, 10:29 AM

## 2022-07-06 NOTE — Progress Notes (Signed)
NG tube clamped per MD orders

## 2022-07-06 NOTE — Progress Notes (Signed)
PHARMACY - TOTAL PARENTERAL NUTRITION CONSULT NOTE   Indication: Small bowel obstruction  Patient Measurements: Height: '5\' 8"'$  (172.7 cm) Weight: 71 kg (156 lb 8.4 oz) IBW/kg (Calculated) : 68.4 TPN AdjBW (KG): 73.9 Body mass index is 23.8 kg/m. Usual Weight: 74 kg  Assessment:  71 year old male PMH including polyneuropathy, rectal cancer s/p abdominoperineal proctocolectomy and abdominal perineal resection 2011 s/p chemo and XRT admitted with small bowel obstruction. Now s/p Hartmann's procedure 12/13. Pharmacy has been consulted to start and manage TPN.  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   Plan: Continue TPN at goal rate of 65 mL/hr '@1800'$ .  Electrolytes in TPN: Na 31mq/L, K 5247m/L, Ca 47m71mL, Mg 47mE82m, and Phos 147mm547m. Cl:Ac 1:1 Mag and Phos repleted. WNL. Add standard MVI and trace elements to TPN Continue Normal Saline @ 25 mL/hr '@1800'$  Monitor TPN labs on Mon/Thurs. BMP with Mag again tomorrow AM.  KristChinita GreenlandmD Clinical Pharmacist 07/06/2022

## 2022-07-07 DIAGNOSIS — K6389 Other specified diseases of intestine: Secondary | ICD-10-CM | POA: Diagnosis not present

## 2022-07-07 LAB — GLUCOSE, CAPILLARY
Glucose-Capillary: 102 mg/dL — ABNORMAL HIGH (ref 70–99)
Glucose-Capillary: 106 mg/dL — ABNORMAL HIGH (ref 70–99)
Glucose-Capillary: 109 mg/dL — ABNORMAL HIGH (ref 70–99)
Glucose-Capillary: 123 mg/dL — ABNORMAL HIGH (ref 70–99)
Glucose-Capillary: 125 mg/dL — ABNORMAL HIGH (ref 70–99)
Glucose-Capillary: 127 mg/dL — ABNORMAL HIGH (ref 70–99)

## 2022-07-07 MED ORDER — TRACE MINERALS CU-MN-SE-ZN 300-55-60-3000 MCG/ML IV SOLN
INTRAVENOUS | Status: AC
  Filled 2022-07-07: qty 572

## 2022-07-07 MED ORDER — DIPHENHYDRAMINE HCL 25 MG PO CAPS
25.0000 mg | ORAL_CAPSULE | Freq: Four times a day (QID) | ORAL | Status: DC | PRN
  Administered 2022-07-07 – 2022-07-08 (×2): 25 mg via ORAL
  Filled 2022-07-07 (×2): qty 1

## 2022-07-07 MED ORDER — FAMOTIDINE 20 MG PO TABS
40.0000 mg | ORAL_TABLET | Freq: Once | ORAL | Status: AC
  Administered 2022-07-07: 40 mg via ORAL
  Filled 2022-07-07: qty 2

## 2022-07-07 NOTE — Progress Notes (Signed)
PHARMACY - TOTAL PARENTERAL NUTRITION CONSULT NOTE   Indication: Small bowel obstruction  Patient Measurements: Height: '5\' 8"'$  (172.7 cm) Weight: 71 kg (156 lb 8.4 oz) IBW/kg (Calculated) : 68.4 TPN AdjBW (KG): 73.9 Body mass index is 23.8 kg/m. Usual Weight: 74 kg  Assessment:  71 year old male PMH including polyneuropathy, rectal cancer s/p abdominoperineal proctocolectomy and abdominal perineal resection 2011 s/p chemo and XRT admitted with small bowel obstruction. Now s/p Hartmann's procedure 12/13. Pharmacy has been consulted to start and manage TPN.  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  Electrolytes in TPN: Na 24mq/L, K 569m/L, Ca 51m67mL, Mg 51mE38m, and Phos 151mm3m. Cl:Ac 1:1 Add standard MVI and trace elements to TPN Continue Normal Saline @ 25 mL/hr '@1800'$  Monitor TPN labs on Mon/Thurs.   KristChinita GreenlandmD Clinical Pharmacist 07/07/2022

## 2022-07-07 NOTE — Progress Notes (Signed)
07/07/2022  Subjective: No acute events overnight.  Patient passed NG clamping trial and the NG tube was removed yesterday and started on a clear liquid diet.  Patient denies any nausea with the clear liquids.  Still having flatus and has been burping the bag but has not had liquid stool yet.  Vital signs: Temp:  [97.6 F (36.4 C)-99.1 F (37.3 C)] 97.6 F (36.4 C) (12/17 0755) Pulse Rate:  [70-82] 70 (12/17 0755) Resp:  [16-18] 17 (12/17 0755) BP: (121-157)/(73-93) 125/73 (12/17 0755) SpO2:  [97 %-99 %] 97 % (12/17 0755)   Intake/Output: 12/16 0701 - 12/17 0700 In: 28 [P.O.:780] Out: 735 [Urine:725; Drains:10]    Physical Exam: Constitutional: No acute distress Abdomen: Soft, nondistended, appropriately tender to palpation.  Midline incision is clean, dry, intact with staples in place.  Drain with serosanguineous fluid.  Left lower quadrant ostomy with air in the bag but no stool yet.  Labs:  No results for input(s): "WBC", "HGB", "HCT", "PLT" in the last 72 hours. Recent Labs    07/05/22 0455 07/06/22 0442  NA 140 138  K 4.3 4.4  CL 109 108  CO2 26 25  GLUCOSE 112* 109*  BUN 20 26*  CREATININE 0.93 0.83  CALCIUM 8.6* 8.4*   No results for input(s): "LABPROT", "INR" in the last 72 hours.  Imaging: No results found.  Assessment/Plan: This is a 71 y.o. male status post en bloc resection of colon and small bowel due to colonic mass.  - The patient is clinically doing well and tolerating a clear liquid diet and having flatus.  Will advance her diet to full liquid diet but discussed with the patient that we would await until there is more stool in the bag prior to advancing further. - Continue TPN for now until his diet is further advanced. - Out of bed, ambulate as tolerated.   I spent 35 minutes dedicated to the care of this patient on the date of this encounter to include pre-visit review of records, face-to-face time with the patient discussing diagnosis and  management, and any post-visit coordination of care.  Melvyn Neth, [facility]

## 2022-07-07 NOTE — Progress Notes (Signed)
Pt has new rash raised red med size circular and itchy when touched and somewhat tender on Abdomen, bilaterally, groin bilaterally and back. Pt states this is new and denies any new lotions or creams. Vitals are WNL no other distress noted. On call provider [place] notified. RN waiting on response.   Pt Ostomy has new brown liquid output in ostomy after changing diet today to clear liquid and eating french onion soup. Pt denies N/V or abdominal pain.

## 2022-07-08 LAB — COMPREHENSIVE METABOLIC PANEL
ALT: 20 U/L (ref 0–44)
AST: 20 U/L (ref 15–41)
Albumin: 2.6 g/dL — ABNORMAL LOW (ref 3.5–5.0)
Alkaline Phosphatase: 55 U/L (ref 38–126)
Anion gap: 5 (ref 5–15)
BUN: 18 mg/dL (ref 8–23)
CO2: 25 mmol/L (ref 22–32)
Calcium: 8.6 mg/dL — ABNORMAL LOW (ref 8.9–10.3)
Chloride: 107 mmol/L (ref 98–111)
Creatinine, Ser: 0.83 mg/dL (ref 0.61–1.24)
GFR, Estimated: >60 mL/min (ref >60)
Glucose, Bld: 98 mg/dL (ref 70–99)
Potassium: 4.4 mmol/L (ref 3.5–5.1)
Sodium: 137 mmol/L (ref 135–145)
Total Bilirubin: 0.5 mg/dL (ref 0.3–1.2)
Total Protein: 5.7 g/dL — ABNORMAL LOW (ref 6.5–8.1)

## 2022-07-08 LAB — PHOSPHORUS: Phosphorus: 4 mg/dL (ref 2.5–4.6)

## 2022-07-08 LAB — GLUCOSE, CAPILLARY
Glucose-Capillary: 120 mg/dL — ABNORMAL HIGH (ref 70–99)
Glucose-Capillary: 120 mg/dL — ABNORMAL HIGH (ref 70–99)
Glucose-Capillary: 130 mg/dL — ABNORMAL HIGH (ref 70–99)
Glucose-Capillary: 124 mg/dL — ABNORMAL HIGH (ref 70–99)
Glucose-Capillary: 98 mg/dL (ref 70–99)

## 2022-07-08 LAB — MAGNESIUM: Magnesium: 1.9 mg/dL (ref 1.7–2.4)

## 2022-07-08 LAB — TRIGLYCERIDES: Triglycerides: 90 mg/dL (ref <150)

## 2022-07-08 MED ORDER — ADULT MULTIVITAMIN W/MINERALS CH: 1.0000 | ORAL_TABLET | Freq: Every day | ORAL | Status: DC

## 2022-07-08 MED ORDER — TRACE MINERALS CU-MN-SE-ZN 300-55-60-3000 MCG/ML IV SOLN
INTRAVENOUS | Status: DC
  Filled 2022-07-08: qty 717.6

## 2022-07-08 MED ORDER — TRACE MINERALS CU-MN-SE-ZN 300-55-60-3000 MCG/ML IV SOLN
INTRAVENOUS | Status: DC
  Filled 2022-07-08: qty 326.4

## 2022-07-08 MED ORDER — ENSURE ENLIVE PO LIQD
237.0000 mL | Freq: Three times a day (TID) | ORAL | Status: DC
  Administered 2022-07-08: 237 mL via ORAL

## 2022-07-08 NOTE — Progress Notes (Signed)
Nutrition Follow Up Note   DOCUMENTATION CODES:   Severe malnutrition in context of acute illness/injury  INTERVENTION:   TPN per pharmacy- plan is for 1/2 rate tonight    Ensure Enlive po TID, each supplement provides 350 kcal and 20 grams of protein.  MVI po daily   NUTRITION DIAGNOSIS:   Severe Malnutrition related to acute illness as evidenced by percent weight loss, moderate fat depletion, moderate muscle depletion.  GOAL:   Patient will meet greater than or equal to 90% of their needs -met   MONITOR:   PO intake, Supplement acceptance, Labs, Weight trends, Skin, I & O's, TPN  ASSESSMENT:   71 y/o male with h/o polyneuropathy, rectal cancer s/p abdominoperineal proctocolectomy and abdominal perineal resection 2011 s/p chemo and XRT who is admitted with LBO secondary to new colon mass now s/p ex lap 12/13 (with en bloc colon resection, including small bowel, reanastomosis x2, primary repair of parastomal hernia).  Met with in room today. Pt reports that he is feeling better today. Pt reports minimal pain around his incision. Pt denies any cramping or nausea. Pt reports that he is starting to have some output in his ostomy. Pt tolerating TPN well at goal rate; plan is for half rate tonight. Pt advanced to a regular diet today. Pt reports eating 100% of his full liquid diet for lunch. RD discussed with pt the importance of adequate nutrition needed to support post op healing. Pt prefers Boost, but is willing to drink chocolate Ensure in hospital. RD will add add supplements and MVI. Per chart, pt is down 8lbs(5%) since admission.    Of note, pt with multiple small bowel resections. Pathology report is still pending. RD will follow up with patient regarding vitamin supplementation.     Medications reviewed and include: lovenox, protonix  Labs reviewed: K 4.4 wnl, P 4.0 wnl, Mg 1.9 wnl Triglycerides- 90 Wbc- 16.4(H) Cbgs- 130, 120, 120 x 24 hrs  Drains 17m   Diet Order:    Diet Order             Diet regular Fluid consistency: Thin  Diet effective now                  EDUCATION NEEDS:   Education needs have been addressed  Skin:  Skin Assessment: Reviewed RN Assessment  Last BM:  12/18- 224mvia ostomy  Height:   Ht Readings from Last 1 Encounters:  06/29/22 _0  (1.727 m)    Weight:   Wt Readings from Last 1 Encounters:  07/08/22 70.3 kg    Ideal Body Weight:  70 kg  BMI:  Body mass index is 23.57 kg/m.  Estimated Nutritional Needs:   Kcal:  1900-2200kcal/day  Protein:  95-110g/day  Fluid:  1.9-2.2L/day  CaKoleen DistanceS, RD, LDN Please refer to [facility] and/or RD on-call/weekend/after hours pager

## 2022-07-08 NOTE — Progress Notes (Signed)
Subjective:  CC: [name] is a 71 y.o. male  Hospital stay day 9, 5 Days Post-Op ex lap, en bloc colon resection, including small bowel, reanastomosis x2, primary repair of parastomal hernia  HPI: No issues overnight.  Ostomy output noted with full liquid diet.  Has developed a rash around abdominal area minimally itchy  ROS:  General: Denies weight loss, weight gain, fatigue, fevers, chills, and night sweats. Heart: Denies chest pain, palpitations, racing heart, irregular heartbeat, leg pain or swelling, and decreased activity tolerance. Respiratory: Denies breathing difficulty, shortness of breath, wheezing, cough, and sputum. GI: Denies change in appetite, heartburn, nausea, vomiting, constipation, diarrhea, and blood in stool. GU: Denies difficulty urinating, pain with urinating, urgency, frequency, blood in urine.   Objective:   Temp:  [98.3 F (36.8 C)-99.4 F (37.4 C)] 98.3 F (36.8 C) (12/18 0908) Pulse Rate:  [70-78] 77 (12/18 0908) Resp:  [17-18] 18 (12/18 0908) BP: (120-136)/(74-77) 136/76 (12/18 0908) SpO2:  [98 %-100 %] 100 % (12/18 0908) Weight:  [70.3 kg] 70.3 kg (12/18 0724)     Height: '5\' 8"'$  (172.7 cm) Weight: 70.3 kg BMI (Calculated): 23.57   Intake/Output this shift:   Intake/Output Summary (Last 24 hours) at 07/08/2022 1634 Last data filed at 07/08/2022 1345 Gross per 24 hour  Intake 1990.19 ml  Output 530 ml  Net 1460.19 ml    Constitutional :  alert, cooperative, appears stated age, and no distress  Respiratory:  clear to auscultation bilaterally  Cardiovascular:  regular rate and rhythm  Gastrointestinal: soft, non-tender; bowel sounds normal; no masses,  no organomegaly.  No ostomy output, JP with serosanguineous output  Skin: Cool and moist.  Midline incision clean dry and intact, extensive papillary raised lesions consistent with contact dermatitis possibly from ChloraPrep.  Psychiatric: Normal affect, non-agitated, not confused        LABS:     Latest Ref Rng & Units 07/08/2022    4:11 AM 07/06/2022    4:42 AM 07/05/2022    4:55 AM  CMP  Glucose 70 - 99 mg/dL 98  109  112   BUN 8 - 23 mg/dL '18  26  20   '$ Creatinine 0.61 - 1.24 mg/dL 0.83  0.83  0.93   Sodium 135 - 145 mmol/L 137  138  140   Potassium 3.5 - 5.1 mmol/L 4.4  4.4  4.3   Chloride 98 - 111 mmol/L 107  108  109   CO2 22 - 32 mmol/L '25  25  26   '$ Calcium 8.9 - 10.3 mg/dL 8.6  8.4  8.6   Total Protein 6.5 - 8.1 g/dL 5.7     Total Bilirubin 0.3 - 1.2 mg/dL 0.5     Alkaline Phos 38 - 126 U/L 55     AST 15 - 41 U/L 20     ALT 0 - 44 U/L 20         Latest Ref Rng & Units 07/04/2022    4:54 AM 07/03/2022    5:17 AM 07/02/2022    5:40 AM  CBC  WBC 4.0 - 10.5 K/uL 16.4  9.5  9.1   Hemoglobin 13.0 - 17.0 g/dL 11.5  11.0  10.9   Hematocrit 39.0 - 52.0 % 35.1  33.3  33.1   Platelets 150 - 400 K/uL 243  209  226     Assessment:   Partially obstructing colon mass status post colonoscopy and biopsy.  Subsequent ex lap, en bloc colon resection, including  small bowel, reanastomosis x2, primary repair of parastomal hernia  Advance to regular diet.  Skin reaction likely from chlorhexidine based on distribution focus only in the abdominal area.  Benadryl as needed until resolved.  Okay to shower.  JP remains serosanguineous and ostomy is productive.  Hopefully discharge in the next day or 2 once tolerating regular diet.  Will start weaning TPN in the meantime as well.  labs/images/medications/previous chart entries reviewed personally and relevant changes/updates noted above.

## 2022-07-08 NOTE — Progress Notes (Signed)
PHARMACY - TOTAL PARENTERAL NUTRITION CONSULT NOTE   Indication: Small bowel obstruction  Patient Measurements: Height: '5\' 8"'$  (172.7 cm) Weight: 70.3 kg (154 lb 15.7 oz) IBW/kg (Calculated) : 68.4 TPN AdjBW (KG): 73.9 Body mass index is 23.57 kg/m. Usual Weight: 74 kg  Assessment:  71 year old male PMH including polyneuropathy, rectal cancer s/p abdominoperineal proctocolectomy and abdominal perineal resection 2011 s/p chemo and XRT admitted with small bowel obstruction. Now s/p Hartmann's procedure 12/13. Pharmacy has been consulted to start and manage TPN.  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 Cl:Ac 1:1 Add standard MVI and trace elements to TPN stop Normal Saline  Monitor TPN labs on Mon/Thurs.   RodneVallery SarmD, BCPS  Clinical Pharmacist 07/08/2022

## 2022-07-08 NOTE — Care Management Important Message (Signed)
Important Message  Patient Details  Name: ALMOND FITZGIBBON MRN: [id number] Date of Birth: 1951-03-17   Medicare Important Message Given:  Yes     Dannette Barbara 07/08/2022, 10:36 AM

## 2022-07-09 ENCOUNTER — Other Ambulatory Visit: Payer: Self-pay | Admitting: Surgery

## 2022-07-09 ENCOUNTER — Other Ambulatory Visit: Payer: Self-pay | Admitting: Pathology

## 2022-07-09 DIAGNOSIS — C189 Malignant neoplasm of colon, unspecified: Secondary | ICD-10-CM

## 2022-07-09 LAB — GLUCOSE, CAPILLARY
Glucose-Capillary: 99 mg/dL (ref 70–99)
Glucose-Capillary: 97 mg/dL (ref 70–99)

## 2022-07-09 MED ORDER — DOCUSATE SODIUM 100 MG PO CAPS: 100.0000 mg | ORAL_CAPSULE | Freq: Two times a day (BID) | ORAL | 0 refills | Status: AC | PRN

## 2022-07-09 MED ORDER — IBUPROFEN 800 MG PO TABS: 800.0000 mg | ORAL_TABLET | Freq: Three times a day (TID) | ORAL | 0 refills | Status: DC | PRN

## 2022-07-09 MED ORDER — ACETAMINOPHEN 325 MG PO TABS: 650.0000 mg | ORAL_TABLET | Freq: Three times a day (TID) | ORAL | 0 refills | Status: AC | PRN

## 2022-07-09 MED ORDER — HYDROCODONE-ACETAMINOPHEN 5-325 MG PO TABS: 1.0000 | ORAL_TABLET | Freq: Four times a day (QID) | ORAL | 0 refills | Status: DC | PRN

## 2022-07-09 NOTE — Progress Notes (Signed)
Path results reviewed with patient.  Med/onc referral placed.

## 2022-07-09 NOTE — Discharge Instructions (Signed)
Laparoscopic Colectomy, Care After This sheet gives you information about how to care for yourself after your procedure. Your health care provider may also give you more specific instructions. If you have problems or questions, contact your health care provider. What can I expect after the procedure? After your procedure, it is common to have the following: Pain in your abdomen, especially in the incision areas. You will be given medicine to control the pain. Tiredness. This is a normal part of the recovery process. Your energy level will return to normal over the next several weeks. Changes in your bowel movements, such as constipation or needing to go more often. Talk with your health care provider about how to manage this. Follow these instructions at home: Medicines  tylenol and advil as needed for discomfort.  Please alternate between the two every four hours as needed for pain.    Use narcotics, if prescribed, only when tylenol and motrin is not enough to control pain.  325-650mg every 8hrs to max of 4000mg/24hrs (including the 325mg in every norco dose) for the tylenol.    Advil up to 800mg per dose every 8hrs as needed for pain.   Do not drive or use heavy machinery while taking prescription pain medicine. Do not drink alcohol while taking prescription pain medicine. If you were prescribed an antibiotic medicine, use it as told by your health care provider. Do not stop using the antibiotic even if you start to feel better. Incision care    Follow instructions from your health care provider about how to take care of your incision areas. Make sure you: Keep your incisions clean and dry. Wash your hands with soap and water before and after applying medicine to the areas, and before and after changing your bandage (dressing). If soap and water are not available, use hand sanitizer. Change your dressing as told by your health care provider. Leave stitches (sutures), skin glue, or adhesive  strips in place. These skin closures may need to stay in place for 2 weeks or longer. If adhesive strip edges start to loosen and curl up, you may trim the loose edges. Do not remove adhesive strips completely unless your health care provider tells you to do that. Do not wear tight clothing over the incisions. Tight clothing may rub and irritate the incision areas, which may cause the incisions to open. Do not take baths, swim, or use a hot tub until your health care provider approves. OK TO SHOWER.   Check your incision area every day for signs of infection. Check for: More redness, swelling, or pain. More fluid or blood. Warmth. Pus or a bad smell. Activity Avoid lifting anything that is heavier than 10 lb (4.5 kg) for 2 weeks or until your health care provider says it is okay. You may resume normal activities as told by your health care provider. Ask your health care provider what activities are safe for you. Take rest breaks during the day as needed. Eating and drinking Follow instructions from your health care provider about what you can eat after surgery. To prevent or treat constipation while you are taking prescription pain medicine, your health care provider may recommend that you: Drink enough fluid to keep your urine clear or pale yellow. Take over-the-counter or prescription medicines. Eat foods that are high in fiber, such as fresh fruits and vegetables, whole grains, and beans. Limit foods that are high in fat and processed sugars, such as fried and sweet foods. General instructions Ask your   health care provider when you will need an appointment to get your sutures or staples removed. Keep all follow-up visits as told by your health care provider. This is important. Contact a health care provider if: You have more redness, swelling, or pain around your incisions. You have more fluid or blood coming from the incisions. Your incisions feel warm to the touch. You have pus or a  bad smell coming from your incisions or your dressing. You have a fever. You have an incision that breaks open (edges not staying together) after sutures or staples have been removed. Get help right away if: You develop a rash. You have chest pain or difficulty breathing. You have pain or swelling in your legs. You feel light-headed or you faint. Your abdomen swells (becomes distended). You have nausea or vomiting. You have blood in your stool (feces). This information is not intended to replace advice given to you by your health care provider. Make sure you discuss any questions you have with your health care provider. Document Released: 01/25/2005 Document Revised: 03/27/2018 Document Reviewed: 04/08/2016 Elsevier Interactive Patient Education  2019 [employer]    

## 2022-07-09 NOTE — TOC Transition Note (Signed)
Transition of Care [facility]) - CM/SW Discharge Note   Patient Details  Name: [name] MRN: [id number] Date of Birth: [birth year]  [facility] CM/SW Contact:  Candie Chroman, LCSW Phone Number: 07/09/2022, 8:22 AM   Clinical Narrative: Patient has orders to discharge home today. No further concerns. CSW signing off.    Final next level of care: Home/Self Care Barriers to Discharge: Barriers Resolved   Patient Goals and CMS Choice        Discharge Placement                Patient to be transferred to facility by: Girlfriend   Patient and family notified of of transfer: 07/09/22  Discharge Plan and Services     Post Acute Care Choice: NA                               Social Determinants of Health (SDOH) Interventions Food Insecurity Interventions: Intervention Not Indicated   Readmission Risk Interventions     No data to display

## 2022-07-09 NOTE — Discharge Summary (Signed)
Physician Discharge Summary  Patient ID: [name] MRN: [id number] DOB/AGE: Jul 27, 1950 71 y.o.  Admit date: 06/29/2022 Discharge date: 07/09/2022  Admission Diagnoses: Bowel obstruction  Discharge Diagnoses:  Colon cancer recurrent  Discharged Condition: good  Hospital Course: admitted for above.  Underwent colonoscopy for evaluation of likely mass causing the small bowel obstruction.  Please see procedure note for details.  Confirmation received of malignancy recurrent.  Discussed proceeding with resection.  Underwent surgery.  Please see op note for details.  Post op, recovered as expected except for development of a rash around the abdomen consistent with the distribution of the chlorhexidine prep.  Minimally symptomatic so recommended as needed treatment for symptoms until it resolves..  At time of d/c, tolerating diet and pain controlled  Consults: None  Discharge Exam: Blood pressure (!) 126/90, pulse 80, temperature 98.1 F (36.7 C), temperature source Oral, resp. rate 16, height '5\' 8"'$  (1.727 m), weight 70.3 kg, SpO2 100 %. General appearance: alert, cooperative, and no distress GI: soft, non-tender; bowel sounds normal; no masses,  no organomegaly staple line clean dry and intact.  Ostomy patent productive.  Raised papillary erythematous lesions consistent with contact dermatitis type reaction across abdomen remained stable with no signs of infection minimally itchy  Disposition:  Discharge disposition: 01-Home or Self Care        Allergies as of 07/09/2022   No Known Allergies      Medication List     STOP taking these medications    Azelastine HCl 137 MCG/SPRAY Soln       TAKE these medications    acetaminophen 325 MG tablet Commonly known as: Tylenol Take 2 tablets (650 mg total) by mouth every 8 (eight) hours as needed for mild pain.   docusate sodium 100 MG capsule Commonly known as: Colace Take 1 capsule (100 mg total) by mouth 2 (two) times  daily as needed for up to 10 days for mild constipation.   HYDROcodone-acetaminophen 5-325 MG tablet Commonly known as: Norco Take 1 tablet by mouth every 6 (six) hours as needed for up to 6 doses for moderate pain.   ibuprofen 800 MG tablet Commonly known as: ADVIL Take 1 tablet (800 mg total) by mouth every 8 (eight) hours as needed for mild pain or moderate pain.        Follow-up Information     [place], Mahika Vanvoorhis, DO. Go in 1 week(s).   Specialty: Surgery Why: 07/18/22 11AM post op colectomy Contact information:  [place] [address] [phone]                  Total time spent arranging discharge was >33mn. Signed: IBenjamine Sprague12/19/2023, 10:15 AM

## 2022-07-12 ENCOUNTER — Encounter: Payer: Self-pay | Admitting: Internal Medicine

## 2022-07-12 ENCOUNTER — Inpatient Hospital Stay: Payer: PPO | Attending: Internal Medicine | Admitting: Internal Medicine

## 2022-07-12 ENCOUNTER — Inpatient Hospital Stay: Payer: PPO

## 2022-07-12 VITALS — BP 153/88 | HR 88 | Temp 98.4°F | Resp 18 | Wt 143.0 lb

## 2022-07-12 DIAGNOSIS — I700 Atherosclerosis of aorta: Secondary | ICD-10-CM

## 2022-07-12 DIAGNOSIS — D509 Iron deficiency anemia, unspecified: Secondary | ICD-10-CM

## 2022-07-12 DIAGNOSIS — G8918 Other acute postprocedural pain: Secondary | ICD-10-CM | POA: Diagnosis not present

## 2022-07-12 DIAGNOSIS — D649 Anemia, unspecified: Secondary | ICD-10-CM

## 2022-07-12 DIAGNOSIS — K56609 Unspecified intestinal obstruction, unspecified as to partial versus complete obstruction: Secondary | ICD-10-CM | POA: Diagnosis not present

## 2022-07-12 DIAGNOSIS — Z933 Colostomy status: Secondary | ICD-10-CM

## 2022-07-12 DIAGNOSIS — G629 Polyneuropathy, unspecified: Secondary | ICD-10-CM | POA: Diagnosis not present

## 2022-07-12 DIAGNOSIS — Z79899 Other long term (current) drug therapy: Secondary | ICD-10-CM | POA: Diagnosis not present

## 2022-07-12 DIAGNOSIS — C184 Malignant neoplasm of transverse colon: Secondary | ICD-10-CM

## 2022-07-12 DIAGNOSIS — K435 Parastomal hernia without obstruction or  gangrene: Secondary | ICD-10-CM

## 2022-07-12 DIAGNOSIS — K8020 Calculus of gallbladder without cholecystitis without obstruction: Secondary | ICD-10-CM

## 2022-07-12 DIAGNOSIS — J8410 Pulmonary fibrosis, unspecified: Secondary | ICD-10-CM | POA: Diagnosis not present

## 2022-07-12 DIAGNOSIS — Z9049 Acquired absence of other specified parts of digestive tract: Secondary | ICD-10-CM | POA: Diagnosis not present

## 2022-07-12 DIAGNOSIS — Z923 Personal history of irradiation: Secondary | ICD-10-CM | POA: Diagnosis not present

## 2022-07-12 DIAGNOSIS — Z9221 Personal history of antineoplastic chemotherapy: Secondary | ICD-10-CM | POA: Diagnosis not present

## 2022-07-12 LAB — CBC WITH DIFFERENTIAL/PLATELET
Abs Immature Granulocytes: 0.06 10*3/uL (ref 0.00–0.07)
Basophils Absolute: 0.1 10*3/uL (ref 0.0–0.1)
Basophils Relative: 1 %
Eosinophils Absolute: 0.4 10*3/uL (ref 0.0–0.5)
Eosinophils Relative: 4 %
HCT: 33.4 % — ABNORMAL LOW (ref 39.0–52.0)
Hemoglobin: 10.7 g/dL — ABNORMAL LOW (ref 13.0–17.0)
Immature Granulocytes: 1 %
Lymphocytes Relative: 18 %
Lymphs Abs: 1.9 10*3/uL (ref 0.7–4.0)
MCH: 25.3 pg — ABNORMAL LOW (ref 26.0–34.0)
MCHC: 32 g/dL (ref 30.0–36.0)
MCV: 79 fL — ABNORMAL LOW (ref 80.0–100.0)
Monocytes Absolute: 0.8 10*3/uL (ref 0.1–1.0)
Monocytes Relative: 8 %
Neutro Abs: 6.9 10*3/uL (ref 1.7–7.7)
Neutrophils Relative %: 68 %
Platelets: 400 10*3/uL (ref 150–400)
RBC: 4.23 MIL/uL (ref 4.22–5.81)
RDW: 17.6 % — ABNORMAL HIGH (ref 11.5–15.5)
WBC: 10.1 10*3/uL (ref 4.0–10.5)
nRBC: 0 % (ref 0.0–0.2)

## 2022-07-12 LAB — SURGICAL PATHOLOGY

## 2022-07-12 LAB — IRON AND TIBC
Iron: 31 ug/dL — ABNORMAL LOW (ref 45–182)
Saturation Ratios: 9 % — ABNORMAL LOW (ref 17.9–39.5)
TIBC: 347 ug/dL (ref 250–450)
UIBC: 316 ug/dL

## 2022-07-12 LAB — FERRITIN: Ferritin: 37 ng/mL (ref 24–336)

## 2022-07-12 LAB — VITAMIN B12: Vitamin B-12: 346 pg/mL (ref 180–914)

## 2022-07-12 MED ORDER — HYDROCODONE-ACETAMINOPHEN 5-325 MG PO TABS: 1.0000 | ORAL_TABLET | Freq: Four times a day (QID) | ORAL | 0 refills | Status: DC | PRN

## 2022-07-12 NOTE — Progress Notes (Signed)
Patient here today for initial evaluation regarding colon cancer. Patient first diagnosed with colon cancer and went through chemotherapy treatment in 2011. Patient reports he has neuropathy from this time. Patient reports pain 3/10 today, would like to get a refill on hydrocodone if possible as some post op pain persists. Patient is not sleeping more than 2-3 hours due to pain.

## 2022-07-12 NOTE — Progress Notes (Signed)
[facility] NOTE  Patient Care Team: Juluis Pitch, MD as PCP - General (Family Medicine) Clent Jacks, RN as Oncology Nurse Navigator  REFERRING PROVIDER: Dr. Lysle Pearl  REASON FOR REFFERAL: discuss adjuvant treatment for colon cancer  CANCER STAGING   Cancer Staging  Cancer of transverse colon [facility]) Staging form: Colon and Rectum, AJCC 8th Edition - Clinical: Stage IIIC (cT4b, cN1a, cM0) - Signed by [name], MD on 07/12/2022 Stage prefix: Initial diagnosis Total positive nodes: 1 Total nodes examined: 24   ASSESSMENT & PLAN:  [name] 71 y.o. male with pmh of colon cancer s/p left sided colostomy in 2011, chemotherapy, neuropathy follows with Medical Oncology for Stage IIIC colon cancer.   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 S/p resection and reanastomosis with Dr. Lysle Pearl on 12/13. CT imaging negative for metastasis.   - discussed with the patient and significant other about the surg path, staging and need for adjuvant treatment. He has high risk Stage III disease so will benefit from CAPEOX 3-6 months or FOLFOX 6 months. Side effects including but not limited to low blood counts, increased risk of infection, need for blood transfusion, diarrhea, cold sensitivity, hair loss, fatigue, change in appetite was discussed. Patient prefers Capecitabine since it is oral medication. 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 We discussed that he is at risk of worsening neuropathy with oxaliplatin since he has baseline neuropathy. I will closely monitor and will consider discontinuation with any worsening. Plan to start treatment after 4 weeks of surgery.   #Post op pain  - affecting his sleep. Norco was helping - Refilled #14 pills.   #Microcytic anemia - labs below   #History of colon cancer s/p colostomy, chemo in 2011   Orders Placed This Encounter  Procedures    CBC with Differential    Standing Status:   Future    Number of Occurrences:   1    Standing Expiration Date:   07/12/2023   Ferritin    Standing Status:   Future    Number of Occurrences:   1    Standing Expiration Date:   07/13/2023   Iron and TIBC    Standing Status:   Future    Number of Occurrences:   1    Standing Expiration Date:   07/13/2023   Vitamin B12    Standing Status:   Future    Number of Occurrences:   1    Standing Expiration Date:   07/13/2023   CBC with Differential    Standing Status:   Future    Standing Expiration Date:   07/12/2023   Comprehensive metabolic panel    Standing Status:   Future    Standing Expiration Date:   07/12/2023   RTC in 3 weeks for md visit, labs   The total time spent in the appointment was 60 minutes encounter with patients including review of chart and various tests results, discussions about plan of care and coordination of care plan   All questions were answered. The patient knows to call the clinic with any problems, questions or concerns. No barriers to learning was detected.  [name], MD 12/22/20234:19 PM   HISTORY OF PRESENTING ILLNESS:  [name] 71 y.o. male with pmh of colon cancer s/p left sided colostomy in 2011, chemotherapy, neuropathy follows with Medical Oncology for Stage IIIC colon cancer.   Patient was seen today accompained by significant  other. He is slowly recovering from surgery. He has some pain on left side, burning like which affects sleep. Norco was helping but ran out of it. Requesting refill. Denies nausea, vomiting, fever.   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 Has difficulty touching cold objects.   I have reviewed his chart and materials related to his cancer extensively and collaborated history with the patient. Summary of oncologic history is as follows: Oncology History Overview Note  Patient has a history of colon cancer in 2011 treated with Dr. Inez Pilgrim with radiation  followed by surgery s/p left sided colostomy and adjuvant chemo likely FOLFOX. Records unavailable.    Cancer of transverse colon ([place])  06/29/2022 Initial Diagnosis   Presented to ED for abdominal pain, nausea and vomiting.    Imaging   CT abdo pelvis IMPRESSION: 1. Patient is status post abdominoperineal resection with left lower quadrant ostomy. The ascending and transverse colon are moderately distended and there is abrupt caliber change at the distal transverse colon/splenic flexure where there is irregular masslike thickening. Findings are suspicious for colonic obstruction due to a mass at this location. There is no dilated small bowel. 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 2. Slightly thick-walled presacral fluid collection with peripheral calcification, may reflect chronic postoperative collection. 3. Gallstones. 4. Aortic atherosclerosis.  CT chest IMPRESSION: 1. No acute intrathoracic process. No evidence of intrathoracic metastases.    Procedure   Colonoscopy on 06/29/22 by Dr. Lysle Pearl A fungating partially obstructing mass in mid transverse colon.   07/03/2022 Pathology Results   DIAGNOSIS: A. 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 - SEE CANCER SUMMARY BELOW. - TATTOO INK. - SEPARATE 4.5 CM LENGTH OF SMALL INTESTINE.  CANCER CASE SUMMARY: COLON AND RECTUM Standard(s): AJCC-UICC 8  SPECIMEN Procedure: Transverse colectomy  TUMOR Tumor Site: Transverse colon Histologic Type: Adenocarcinoma Histologic Grade: G2, moderately differentiated Tumor Size: Greatest dimension: 6.1 cm Tumor Extent: Directly invades or adheres to adjacent structures (small intestine) Macroscopic Tumor Perforation: Not identified Lymphatic and/or Vascular Invasion: Large vessel (venous), extramural Perineural Invasion: Present Tumor Budding  Score: High (10 or more) Treatment Effect: No known presurgical therapy  MARGINS Margin Status for Invasive Carcinoma: All margins negative for invasive carcinoma      Margins examined: Proximal, distal, and mesenteric Margin Status for Non-Invasive Tumor: All margins negative for high-grade dysplasia/intramucosal carcinoma and low-grade dysplasia  REGIONAL LYMPH NODES Regional Lymph Nodes: Regional lymph nodes present      Tumor present in regional lymph node(s)           Number of Lymph Nodes with Tumor: 1      Number of Lymph Nodes Examined: 24  Tumor Deposits: Not identified  DISTANT METASTASES Distant Site(s) Involved, if applicable: Not applicable  PATHOLOGIC STAGE CLASSIFICATION (pTNM, AJCC 8th Edition): Modified Classification: Not applicable NO7S      T suffix: Not applicable JG2E pM not applicable - pM cannot be determined from the submitted specimen(s)    07/03/2022 Procedure   Exploratory laparotomy, en bloc colon resection, reanastomosis, primary repair of parastomal hernia by Dr. Lysle Pearl     07/12/2022 Cancer Staging   Staging form: Colon and Rectum, AJCC 8th Edition - Clinical: Stage IIIC (cT4b, cN1a, cM0) - Signed by [name], MD on 07/12/2022 Stage prefix: Initial diagnosis Total positive nodes: 1 Total nodes examined: 24     MEDICAL HISTORY:  Past Medical History:  Diagnosis Date  Colon cancer ([place])    remission 2011   Neuropathy     SURGICAL HISTORY: Past Surgical History:  Procedure Laterality Date   COLECTOMY WITH COLOSTOMY CREATION/HARTMANN PROCEDURE N/A 07/03/2022   Procedure: COLECTOMY WITH COLOSTOMY CREATION/HARTMANN PROCEDURE;  Surgeon: Benjamine Sprague, DO;  Location: [facility] ORS;  Service: General;  Laterality: N/A;   COLONOSCOPY N/A 06/29/2022   Procedure: COLONOSCOPY;  Surgeon: Benjamine Sprague, DO;  Location: [facility] ENDOSCOPY;  Service: General;  Laterality: N/A;   OSTOMY      SOCIAL HISTORY: Social History   Socioeconomic History    Marital status: Married    Spouse name: Not on file   Number of children: Not on file   Years of education: Not on file   Highest education level: Not on file  Occupational History   Not on file  Tobacco Use   Smoking status: Never   Smokeless tobacco: Never  Vaping Use   Vaping Use: Never used  Substance and Sexual Activity   Alcohol use: Not Currently   Drug use: Never   Sexual activity: Not on file  Other Topics Concern   Not on file  Social History Narrative   Not on file   Social Determinants of Health   Financial Resource Strain: Not on file  Food Insecurity: Unknown (06/29/2022)   Hunger Vital Sign    Worried About Running Out of Food in the Last Year: Never true    Ran Out of Food in the Last Year: Not on file  Transportation Needs: No Transportation Needs (06/29/2022)   PRAPARE - Hydrologist (Medical): No    Lack of Transportation (Non-Medical): No  Physical Activity: Not on file  Stress: Not on file  Social Connections: Not on file  Intimate Partner Violence: Not At Risk (06/29/2022)   Humiliation, Afraid, Rape, and Kick questionnaire    Fear of Current or Ex-Partner: No    Emotionally Abused: No    Physically Abused: No    Sexually Abused: No    FAMILY HISTORY: History reviewed. No pertinent family history.  ALLERGIES:  has No Known Allergies.  MEDICATIONS:  Current Outpatient Medications  Medication Sig Dispense Refill   acetaminophen (TYLENOL) 325 MG tablet Take 2 tablets (650 mg total) by mouth every 8 (eight) hours as needed for mild pain. 40 tablet 0   docusate sodium (COLACE) 100 MG capsule Take 1 capsule (100 mg total) by mouth 2 (two) times daily as needed for up to 10 days for mild constipation. 20 capsule 0   ibuprofen (ADVIL) 800 MG tablet Take 1 tablet (800 mg total) by mouth every 8 (eight) hours as needed for mild pain or moderate pain. 30 tablet 0   HYDROcodone-acetaminophen (NORCO) 5-325 MG tablet Take 1  tablet by mouth every 6 (six) hours as needed for up to 14 doses for moderate pain. 14 tablet 0   No current facility-administered medications for this visit.    REVIEW OF SYSTEMS:   Pertinent information mentioned in HPI All other systems were reviewed with the patient and are negative.  PHYSICAL EXAMINATION: ECOG PERFORMANCE STATUS: 1 - Symptomatic but completely ambulatory  Vitals:   07/12/22 1313  BP: (!) 153/88  Pulse: 88  Resp: 18  Temp: 98.4 F (36.9 C)  SpO2: 99%   Filed Weights   07/12/22 1313  Weight: 143 lb (64.9 kg)    GENERAL:alert, no distress and comfortable SKIN: skin color, texture, turgor are normal, no rashes or significant lesions EYES:  normal, conjunctiva are pink and non-injected, sclera clear OROPHARYNX:no exudate, no erythema and lips, buccal mucosa, and tongue normal  NECK: supple, thyroid normal size, non-tender, without nodularity LYMPH:  no palpable lymphadenopathy in the cervical, axillary or inguinal LUNGS: clear to auscultation and percussion with normal breathing effort HEART: regular rate & rhythm and no murmurs and no lower extremity edema ABDOMEN:abdomen soft, non-tender and normal bowel sounds Musculoskeletal:no cyanosis of digits and no clubbing  PSYCH: alert & oriented x 3 with fluent speech NEURO: no focal motor/sensory deficits  LABORATORY DATA:  I have reviewed the data as listed Lab Results  Component Value Date   WBC 10.1 07/12/2022   HGB 10.7 (L) 07/12/2022   HCT 33.4 (L) 07/12/2022   MCV 79.0 (L) 07/12/2022   PLT 400 07/12/2022   Recent Labs    07/02/22 0540 07/03/22 0517 07/04/22 0454 07/05/22 0455 07/06/22 0442 07/08/22 0411  NA 136   < > 136 140 138 137  K 3.1*   < > 4.2 4.3 4.4 4.4  CL 104   < > 107 109 108 107  CO2 24   < > _0 GLUCOSE 96   < > 150* 112* 109* 98  BUN 14   < > 16 20 26* 18  CREATININE 0.78   < > 0.92 0.93 0.83 0.83  CALCIUM 8.5*   < > 8.8* 8.6* 8.4* 8.6*  GFRNONAA >60   < > >60  >60 >60 >60  PROT 6.1*  --  6.6  --   --  5.7*  ALBUMIN 3.2*  --  3.1*  --   --  2.6*  AST 14*  --  18  --   --  20  ALT 10  --  12  --   --  20  ALKPHOS 62  --  62  --   --  55  BILITOT 0.9  --  0.6  --   --  0.5   < > = values in this interval not displayed.    RADIOGRAPHIC STUDIES: I have personally reviewed the radiological images as listed and agreed with the findings in the report. [place] EKG SITE RITE  Result Date: 07/01/2022 If Site Rite image not attached, placement could not be confirmed due to current cardiac rhythm.  CT CHEST W CONTRAST  Result Date: 06/29/2022 CLINICAL DATA:  Colon cancer, staging EXAM: CT CHEST WITH CONTRAST TECHNIQUE: Multidetector CT imaging of the chest was performed during intravenous contrast administration. RADIATION DOSE REDUCTION: This exam was performed according to the departmental dose-optimization program which includes automated exposure control, adjustment of the mA and/or kV according to patient size and/or use of iterative reconstruction technique. CONTRAST:  168m OMNIPAQUE IOHEXOL 300 MG/ML  SOLN COMPARISON:  06/28/2022, 08/11/2009 FINDINGS: Cardiovascular: The heart is unremarkable without pericardial effusion. No evidence of thoracic aortic aneurysm or dissection. Atherosclerosis of the aorta and coronary vasculature. Mediastinum/Nodes: Enteric catheter extends into the gastric lumen. Thyroid, trachea, and esophagus are grossly unremarkable. No pathologic mediastinal, hilar, or axillary adenopathy. Lungs/Pleura: No acute airspace disease, effusion, or pneumothorax. Hypoventilatory changes are seen at the lung bases. Central airways are patent. Upper Abdomen: Partial visualization of the obstructing mass at the splenic flexure of the colon. Please refer to CT abdomen report from yesterday. Calcified gallstones are again identified without cholecystitis. Musculoskeletal: No acute or destructive bony lesions. Reconstructed images demonstrate no  additional findings. IMPRESSION: 1. No acute intrathoracic process. No evidence of intrathoracic metastases. 2. Obstructing neoplasm  at the splenic flexure of the colon. Please refer to CT abdomen report. 3. Cholelithiasis without cholecystitis. 4. Aortic Atherosclerosis (ICD10-I70.0). Coronary artery atherosclerosis. Electronically Signed   By: Randa Ngo M.D.   On: 06/29/2022 17:00   CT Abdomen Pelvis W Contrast  Result Date: 06/28/2022 CLINICAL DATA:  Abdomen pain nausea vomiting EXAM: CT ABDOMEN AND PELVIS WITH CONTRAST TECHNIQUE: Multidetector CT imaging of the abdomen and pelvis was performed using the standard protocol following bolus administration of intravenous contrast. RADIATION DOSE REDUCTION: This exam was performed according to the departmental dose-optimization program which includes automated exposure control, adjustment of the mA and/or kV according to patient size and/or use of iterative reconstruction technique. CONTRAST:  187m OMNIPAQUE IOHEXOL 300 MG/ML  SOLN COMPARISON:  CT 08/11/2009 FINDINGS: Lower chest: Lung bases demonstrate no acute airspace disease. Calcified granuloma at the posterior right lung base. Hepatobiliary: Gallstones. No biliary dilatation. Subcentimeter hypodensity in the left hepatic lobe too small to further characterize. Pancreas: Unremarkable. No pancreatic ductal dilatation or surrounding inflammatory changes. Spleen: Normal in size without focal abnormality. Adrenals/Urinary Tract: Adrenal glands are within normal limits. Kidneys show no hydronephrosis. Subcentimeter hypodensities in the left kidney are too small to further characterize. Cyst lower pole left kidney, no imaging follow-up is recommended. The bladder is unremarkable. Stomach/Bowel: The stomach is nonenlarged. There is no dilated small bowel. The patient is status post abdominal perineal resection with left abdominal ostomy. Large left lower quadrant parastomal hernia containing mesenteric fat and  bowel. No obstruction. The ascending and transverse colon are moderately distended. Abrupt caliber change with irregular masslike thickening at the distal transverse colon/splenic flexure, series 2, image 32. The colon distal to this is decompressed. 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 Slightly thick-walled presacral fluid collection measuring 3.6 x 4.4 cm with peripheral calcification, may reflect chronic postoperative collection. Some fluid-filled small bowel in the pelvis. Vascular/Lymphatic: Moderate aortic atherosclerosis. No aneurysm. No suspicious lymph nodes. Reproductive: Negative for mass. Other: Negative for pelvic effusion or free air. Mild presacral soft tissue stranding and thickening. Musculoskeletal: No acute osseous abnormality. IMPRESSION: 1. Patient is status post abdominoperineal resection with left lower quadrant ostomy. The ascending and transverse colon are moderately distended and there is abrupt caliber change at the distal transverse colon/splenic flexure where there is irregular masslike thickening. Findings are suspicious for colonic obstruction due to a mass at this location. There is no dilated small bowel. 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 2. Slightly thick-walled presacral fluid collection with peripheral calcification, may reflect chronic postoperative collection. 3. Gallstones. 4. Aortic atherosclerosis. Aortic Atherosclerosis (ICD10-I70.0). Electronically Signed   By: KDonavan FoilM.D.   On: 06/28/2022 22:17

## 2022-07-16 ENCOUNTER — Telehealth: Payer: Self-pay | Admitting: Internal Medicine

## 2022-07-16 NOTE — Telephone Encounter (Signed)
Called patient to inform about lab results Left voicemail with callback number.    Labs showing iron deficiency. Advised to start iron pills M-W-F for better absorption. If experiences any side effects, will consider IV iron when he will start his chemotherapy.

## 2022-07-22 DIAGNOSIS — C184 Malignant neoplasm of transverse colon: Secondary | ICD-10-CM

## 2022-07-22 HISTORY — DX: Malignant neoplasm of transverse colon: C18.4

## 2022-08-06 ENCOUNTER — Inpatient Hospital Stay: Payer: PPO | Attending: Internal Medicine

## 2022-08-06 ENCOUNTER — Encounter: Payer: Self-pay | Admitting: Internal Medicine

## 2022-08-06 ENCOUNTER — Inpatient Hospital Stay (HOSPITAL_BASED_OUTPATIENT_CLINIC_OR_DEPARTMENT_OTHER): Payer: PPO | Admitting: Internal Medicine

## 2022-08-06 ENCOUNTER — Ambulatory Visit: Payer: Self-pay | Admitting: Surgery

## 2022-08-06 VITALS — BP 134/88 | HR 84 | Temp 98.6°F | Resp 20 | Wt 163.0 lb

## 2022-08-06 DIAGNOSIS — D509 Iron deficiency anemia, unspecified: Secondary | ICD-10-CM

## 2022-08-06 DIAGNOSIS — C184 Malignant neoplasm of transverse colon: Secondary | ICD-10-CM

## 2022-08-06 DIAGNOSIS — Z5111 Encounter for antineoplastic chemotherapy: Secondary | ICD-10-CM | POA: Insufficient documentation

## 2022-08-06 DIAGNOSIS — Z452 Encounter for adjustment and management of vascular access device: Secondary | ICD-10-CM | POA: Insufficient documentation

## 2022-08-06 DIAGNOSIS — D649 Anemia, unspecified: Secondary | ICD-10-CM

## 2022-08-06 LAB — CBC WITH DIFFERENTIAL/PLATELET
Abs Immature Granulocytes: 0.03 10*3/uL (ref 0.00–0.07)
Basophils Absolute: 0.1 10*3/uL (ref 0.0–0.1)
Basophils Relative: 1 %
Eosinophils Absolute: 0.3 10*3/uL (ref 0.0–0.5)
Eosinophils Relative: 3 %
HCT: 33.9 % — ABNORMAL LOW (ref 39.0–52.0)
Hemoglobin: 10.7 g/dL — ABNORMAL LOW (ref 13.0–17.0)
Immature Granulocytes: 0 %
Lymphocytes Relative: 26 %
Lymphs Abs: 3 10*3/uL (ref 0.7–4.0)
MCH: 25.5 pg — ABNORMAL LOW (ref 26.0–34.0)
MCHC: 31.6 g/dL (ref 30.0–36.0)
MCV: 80.9 fL (ref 80.0–100.0)
Monocytes Absolute: 0.9 10*3/uL (ref 0.1–1.0)
Monocytes Relative: 7 %
Neutro Abs: 7.2 10*3/uL (ref 1.7–7.7)
Neutrophils Relative %: 63 %
Platelets: 312 10*3/uL (ref 150–400)
RBC: 4.19 MIL/uL — ABNORMAL LOW (ref 4.22–5.81)
RDW: 17.8 % — ABNORMAL HIGH (ref 11.5–15.5)
WBC: 11.5 10*3/uL — ABNORMAL HIGH (ref 4.0–10.5)
nRBC: 0 % (ref 0.0–0.2)

## 2022-08-06 LAB — COMPREHENSIVE METABOLIC PANEL WITH GFR
ALT: 13 U/L (ref 0–44)
AST: 19 U/L (ref 15–41)
Albumin: 3.7 g/dL (ref 3.5–5.0)
Alkaline Phosphatase: 90 U/L (ref 38–126)
Anion gap: 9 (ref 5–15)
BUN: 15 mg/dL (ref 8–23)
CO2: 26 mmol/L (ref 22–32)
Calcium: 9.1 mg/dL (ref 8.9–10.3)
Chloride: 103 mmol/L (ref 98–111)
Creatinine, Ser: 0.93 mg/dL (ref 0.61–1.24)
GFR, Estimated: >60 mL/min
Glucose, Bld: 109 mg/dL — ABNORMAL HIGH (ref 70–99)
Potassium: 3.9 mmol/L (ref 3.5–5.1)
Sodium: 138 mmol/L (ref 135–145)
Total Bilirubin: 0.3 mg/dL (ref 0.3–1.2)
Total Protein: 7.5 g/dL (ref 6.5–8.1)

## 2022-08-06 NOTE — Progress Notes (Signed)
START ON PATHWAY REGIMEN - Colorectal     A cycle is every 14 days:     Oxaliplatin      Leucovorin      Fluorouracil      Fluorouracil   **Always confirm dose/schedule in your pharmacy ordering system**  Patient Characteristics: Postoperative without Neoadjuvant Therapy, M0 (Pathologic Staging), Colon, Stage III, High Risk (pT4 or pN2) Tumor Location: Colon Therapeutic Status: Postoperative without Neoadjuvant Therapy, M0 (Pathologic Staging) AJCC M Category: cM0 AJCC T Category: pT4b AJCC N Category: pN1 AJCC 8 Stage Grouping: IIIC Intent of Therapy: Curative Intent, Discussed with Patient

## 2022-08-06 NOTE — H&P (View-Only) (Signed)
Subjective:   CC: Malignant neoplasm of transverse colon (CMS-HCC) [C18.4]  HPI:  [name] is a 72 y.o. male who returns for above. Decided to proceed with chemo, here to discuss port placement.   Past Medical History:  has a past medical history of History of rectal cancer (08/10/2014), History of rheumatic fever, and Rectal cancer (CMS-HCC).  Past Surgical History:  has a past surgical history that includes Appendectomy; Colonoscopy (08/10/2009); Abdominoperineal proctocolectomy (Abdominal perineal resection); PORT-A-CATH REMOVAL (05/2014); LOWER EUS (08/17/2009); Colonoscopy (09/05/2014); and colectomy partial w/anastamosis (06/2022).  Family History: family history includes Colon cancer in his brother; Coronary Artery Disease (Blocked arteries around heart) in his father; No Known Problems in his brother, brother, mother, sister, and son.  Social History:  reports that he has never smoked. He has never used smokeless tobacco. He reports current alcohol use. He reports current drug use.  Current Medications: has a current medication list which includes the following prescription(s): azelastine, fluticasone propionate, and sildenafil.  Allergies:  No Known Allergies  ROS:  A 15 point review of systems was performed and pertinent positives and negatives noted in HPI   Objective:     BP (!) 143/83   Pulse 85   Ht 172.7 cm ('5\' 8"'$ )   Wt 74.4 kg (164 lb)   BMI 24.94 kg/m   Constitutional :  No distress, cooperative, alert  Lymphatics/Throat:  Supple with no lymphadenopathy  Respiratory:  Clear to auscultation bilaterally  Cardiovascular:  Regular rate and rhythm  Gastrointestinal: Soft, non-tender, non-distended, no organomegaly.  Musculoskeletal: Steady gait and movement  Skin: Cool and moist, healing surgical scars  Psychiatric: Normal affect, non-agitated, not confused         LABS:  N/a   RADS: N/a  Assessment:      Malignant neoplasm of transverse colon  (CMS-HCC) [C18.4], need for port  Plan:     1. Malignant neoplasm of transverse colon (CMS-HCC) [C18.4] Risk alternative benefits discussed.  Risks include bleeding, infection, dislocation, migration, malfunction, unsuccessful placement, pneumothorax, and additional procedures to address that risks.  Benefits include initiation of chemotherapy.  Alternative includes non-IV infusion therapy.  We discussed the need for the port to infuse IV chemotherapy agents, and how the port can be a temporary and/or permanent option depending on patient preference after completion of chemotherapy.  Port will be okay to use as soon as it is placed.  Patient verbalized understanding and all questions and concerns addressed.  The patient verbalized understanding and all questions were answered to the patient's satisfaction.  2. Patient has elected to proceed with surgical treatment. Procedure will be scheduled.  labs/images/medications/previous chart entries reviewed personally and relevant changes/updates noted above.

## 2022-08-06 NOTE — H&P (Signed)
Subjective:   CC: Malignant neoplasm of transverse colon (CMS-HCC) [C18.4]  HPI:  [name] is a 72 y.o. male who returns for above. Decided to proceed with chemo, here to discuss port placement.   Past Medical History:  has a past medical history of History of rectal cancer (08/10/2014), History of rheumatic fever, and Rectal cancer (CMS-HCC).  Past Surgical History:  has a past surgical history that includes Appendectomy; Colonoscopy (08/10/2009); Abdominoperineal proctocolectomy (Abdominal perineal resection); PORT-A-CATH REMOVAL (05/2014); LOWER EUS (08/17/2009); Colonoscopy (09/05/2014); and colectomy partial w/anastamosis (06/2022).  Family History: family history includes Colon cancer in his brother; Coronary Artery Disease (Blocked arteries around heart) in his father; No Known Problems in his brother, brother, mother, sister, and son.  Social History:  reports that he has never smoked. He has never used smokeless tobacco. He reports current alcohol use. He reports current drug use.  Current Medications: has a current medication list which includes the following prescription(s): azelastine, fluticasone propionate, and sildenafil.  Allergies:  No Known Allergies  ROS:  A 15 point review of systems was performed and pertinent positives and negatives noted in HPI   Objective:     BP (!) 143/83   Pulse 85   Ht 172.7 cm ('5\' 8"'$ )   Wt 74.4 kg (164 lb)   BMI 24.94 kg/m   Constitutional :  No distress, cooperative, alert  Lymphatics/Throat:  Supple with no lymphadenopathy  Respiratory:  Clear to auscultation bilaterally  Cardiovascular:  Regular rate and rhythm  Gastrointestinal: Soft, non-tender, non-distended, no organomegaly.  Musculoskeletal: Steady gait and movement  Skin: Cool and moist, healing surgical scars  Psychiatric: Normal affect, non-agitated, not confused         LABS:  N/a   RADS: N/a  Assessment:      Malignant neoplasm of transverse colon  (CMS-HCC) [C18.4], need for port  Plan:     1. Malignant neoplasm of transverse colon (CMS-HCC) [C18.4] Risk alternative benefits discussed.  Risks include bleeding, infection, dislocation, migration, malfunction, unsuccessful placement, pneumothorax, and additional procedures to address that risks.  Benefits include initiation of chemotherapy.  Alternative includes non-IV infusion therapy.  We discussed the need for the port to infuse IV chemotherapy agents, and how the port can be a temporary and/or permanent option depending on patient preference after completion of chemotherapy.  Port will be okay to use as soon as it is placed.  Patient verbalized understanding and all questions and concerns addressed.  The patient verbalized understanding and all questions were answered to the patient's satisfaction.  2. Patient has elected to proceed with surgical treatment. Procedure will be scheduled.  labs/images/medications/previous chart entries reviewed personally and relevant changes/updates noted above.

## 2022-08-06 NOTE — Progress Notes (Signed)
[place] NOTE  Patient Care Team: Juluis Pitch, MD as PCP - General (Family Medicine) Clent Jacks, RN as Oncology Nurse Navigator  REFERRING PROVIDER: Dr. Lysle Pearl  REASON FOR REFFERAL: discuss adjuvant treatment for colon cancer  CANCER STAGING   Cancer Staging  Cancer of transverse colon [facility]) Staging form: Colon and Rectum, AJCC 8th Edition - Clinical: Stage IIIC (cT4b, cN1a, cM0) - Signed by [name], MD on 07/12/2022 Stage prefix: Initial diagnosis Total positive nodes: 1 Total nodes examined: 24   ASSESSMENT & PLAN:  [name] 72 y.o. male with pmh of colon cancer s/p left sided colostomy in 2011, chemotherapy, neuropathy follows with Medical Oncology for Stage IIIC colon cancer.   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 S/p resection and reanastomosis with Dr. Lysle Pearl on 12/13. CT imaging negative for metastasis.   -I discussed with the patient about need for adjuvant chemotherapy for 6 months.  Plan for FOLFOX every 2 weeks for total 12 cycles.  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  Will dose reduce oxaliplatin to 60 mg/m.  Side effects such as low blood count, need for blood transfusion, increased risk of infection, worsening of neuropathy, cold sensitivity, fatigue, decreased appetite, hair loss was discussed.  Patient will be scheduled by Dr. Lysle Pearl for port placement on 08/08/2022.  I plan to start treatment on 1/22.  Chemo class will be scheduled.  Patient had a same chemotherapy in 2011 and is aware about it.  #Microcytic anemia -Iron panel consistent with iron deficiency. -Continue with iron pills.  I will also add IV Venofer 200 mg x 2 doses with the chemotherapy for faster recovery of anemia.  #History of colon cancer s/p colostomy, chemo in 2011 -Surgery and had adjuvant chemotherapy.  No records are available.  Orders Placed This  Encounter  Procedures   CBC with Differential    Standing Status:   Future    Standing Expiration Date:   08/13/2023   Comprehensive metabolic panel    Standing Status:   Future    Standing Expiration Date:   08/13/2023    The total time spent in the appointment was 60 minutes encounter with patients including review of chart and various tests results, discussions about plan of care and coordination of care plan   All questions were answered. The patient knows to call the clinic with any problems, questions or concerns. No barriers to learning was detected.  [name], MD 1/16/20243:20 PM   HISTORY OF PRESENTING ILLNESS:  [name] 72 y.o. male with pmh of colon cancer s/p left sided colostomy in 2011, chemotherapy, neuropathy follows with Medical Oncology for Stage IIIC colon cancer.   Interval history Patient was seen today accompanied by wife postsurgery to finalize the chemotherapy plan.  He is feeling much better since the last time.  Patient has significantly improved.  He is eating better.  He is gaining weight.  He is taking iron pills.  Denies any side effects.  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 Has difficulty touching cold objects.   I have reviewed his chart and materials related to his cancer extensively and collaborated history with the patient. Summary of oncologic history is as follows: Oncology History Overview Note  Patient has a history of colon cancer in 2011 treated with Dr. Inez Pilgrim with radiation followed by surgery s/p left sided colostomy and adjuvant chemo likely FOLFOX. Records unavailable.    Cancer of  transverse colon ([place])  06/29/2022 Initial Diagnosis   Presented to ED for abdominal pain, nausea and vomiting.    Imaging   CT abdo pelvis IMPRESSION: 1. Patient is status post abdominoperineal resection with left lower quadrant ostomy. The ascending and transverse colon are moderately distended and there is abrupt caliber change  at the distal transverse colon/splenic flexure where there is irregular masslike thickening. Findings are suspicious for colonic obstruction due to a mass at this location. There is no dilated small bowel. 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 2. Slightly thick-walled presacral fluid collection with peripheral calcification, may reflect chronic postoperative collection. 3. Gallstones. 4. Aortic atherosclerosis.  CT chest IMPRESSION: 1. No acute intrathoracic process. No evidence of intrathoracic metastases.    Procedure   Colonoscopy on 06/29/22 by Dr. Lysle Pearl A fungating partially obstructing mass in mid transverse colon.   07/03/2022 Pathology Results   DIAGNOSIS: A. 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 - SEE CANCER SUMMARY BELOW. - TATTOO INK. - SEPARATE 4.5 CM LENGTH OF SMALL INTESTINE.  CANCER CASE SUMMARY: COLON AND RECTUM Standard(s): AJCC-UICC 8  SPECIMEN Procedure: Transverse colectomy  TUMOR Tumor Site: Transverse colon Histologic Type: Adenocarcinoma Histologic Grade: G2, moderately differentiated Tumor Size: Greatest dimension: 6.1 cm Tumor Extent: Directly invades or adheres to adjacent structures (small intestine) Macroscopic Tumor Perforation: Not identified Lymphatic and/or Vascular Invasion: Large vessel (venous), extramural Perineural Invasion: Present Tumor Budding Score: High (10 or more) Treatment Effect: No known presurgical therapy  MARGINS Margin Status for Invasive Carcinoma: All margins negative for invasive carcinoma      Margins examined: Proximal, distal, and mesenteric Margin Status for Non-Invasive Tumor: All margins negative for high-grade dysplasia/intramucosal carcinoma and low-grade dysplasia  REGIONAL LYMPH NODES Regional Lymph Nodes: Regional lymph nodes present      Tumor present in  regional lymph node(s)           Number of Lymph Nodes with Tumor: 1      Number of Lymph Nodes Examined: 24  Tumor Deposits: Not identified  DISTANT METASTASES Distant Site(s) Involved, if applicable: Not applicable  PATHOLOGIC STAGE CLASSIFICATION (pTNM, AJCC 8th Edition): Modified Classification: Not applicable LP3X      T suffix: Not applicable TK2I pM not applicable - pM cannot be determined from the submitted specimen(s)    07/03/2022 Procedure   Exploratory laparotomy, en bloc colon resection, reanastomosis, primary repair of parastomal hernia by Dr. Lysle Pearl     07/12/2022 Cancer Staging   Staging form: Colon and Rectum, AJCC 8th Edition - Clinical: Stage IIIC (cT4b, cN1a, cM0) - Signed by [name], MD on 07/12/2022 Stage prefix: Initial diagnosis Total positive nodes: 1 Total nodes examined: 24   08/12/2022 -  Chemotherapy   Patient is on Treatment Plan : COLORECTAL FOLFOX q14d x 6 months       MEDICAL HISTORY:  Past Medical History:  Diagnosis Date   Colon cancer ([place])    remission 2011   Neuropathy     SURGICAL HISTORY: Past Surgical History:  Procedure Laterality Date   COLECTOMY WITH COLOSTOMY CREATION/HARTMANN PROCEDURE N/A 07/03/2022   Procedure: COLECTOMY WITH COLOSTOMY CREATION/HARTMANN PROCEDURE;  Surgeon: Benjamine Sprague, DO;  Location: [facility] ORS;  Service: General;  Laterality: N/A;   COLONOSCOPY N/A 06/29/2022   Procedure: COLONOSCOPY;  Surgeon: Benjamine Sprague, DO;  Location: [facility] ENDOSCOPY;  Service: General;  Laterality: N/A;   OSTOMY      SOCIAL  HISTORY: Social History   Socioeconomic History   Marital status: Single    Spouse name: Not on file   Number of children: Not on file   Years of education: Not on file   Highest education level: Not on file  Occupational History   Not on file  Tobacco Use   Smoking status: Never   Smokeless tobacco: Never  Vaping Use   Vaping Use: Never used  Substance and Sexual Activity   Alcohol use:  Not Currently   Drug use: Never   Sexual activity: Not on file  Other Topics Concern   Not on file  Social History Narrative   Not on file   Social Determinants of Health   Financial Resource Strain: Not on file  Food Insecurity: Unknown (06/29/2022)   Hunger Vital Sign    Worried About Running Out of Food in the Last Year: Never true    Ran Out of Food in the Last Year: Not on file  Transportation Needs: No Transportation Needs (06/29/2022)   PRAPARE - Hydrologist (Medical): No    Lack of Transportation (Non-Medical): No  Physical Activity: Not on file  Stress: Not on file  Social Connections: Not on file  Intimate Partner Violence: Not At Risk (06/29/2022)   Humiliation, Afraid, Rape, and Kick questionnaire    Fear of Current or Ex-Partner: No    Emotionally Abused: No    Physically Abused: No    Sexually Abused: No    FAMILY HISTORY: History reviewed. No pertinent family history.  ALLERGIES:  has No Known Allergies.  MEDICATIONS:  Current Outpatient Medications  Medication Sig Dispense Refill   acetaminophen (TYLENOL) 325 MG tablet Take 2 tablets (650 mg total) by mouth every 8 (eight) hours as needed for mild pain. 40 tablet 0   HYDROcodone-acetaminophen (NORCO) 5-325 MG tablet Take 1 tablet by mouth every 6 (six) hours as needed for up to 14 doses for moderate pain. 14 tablet 0   ibuprofen (ADVIL) 800 MG tablet Take 1 tablet (800 mg total) by mouth every 8 (eight) hours as needed for mild pain or moderate pain. 30 tablet 0   No current facility-administered medications for this visit.    REVIEW OF SYSTEMS:   Pertinent information mentioned in HPI All other systems were reviewed with the patient and are negative.  PHYSICAL EXAMINATION: ECOG PERFORMANCE STATUS: 1 - Symptomatic but completely ambulatory  Vitals:   08/06/22 1322  BP: 134/88  Pulse: 84  Resp: 20  Temp: 98.6 F (37 C)  SpO2: 100%   Filed Weights   08/06/22 1322   Weight: 163 lb (73.9 kg)    GENERAL:alert, no distress and comfortable SKIN: skin color, texture, turgor are normal, no rashes or significant lesions EYES: normal, conjunctiva are pink and non-injected, sclera clear OROPHARYNX:no exudate, no erythema and lips, buccal mucosa, and tongue normal  NECK: supple, thyroid normal size, non-tender, without nodularity LYMPH:  no palpable lymphadenopathy in the cervical, axillary or inguinal LUNGS: clear to auscultation and percussion with normal breathing effort HEART: regular rate & rhythm and no murmurs and no lower extremity edema ABDOMEN:abdomen soft, non-tender and normal bowel sounds Musculoskeletal:no cyanosis of digits and no clubbing  PSYCH: alert & oriented x 3 with fluent speech NEURO: no focal motor/sensory deficits  LABORATORY DATA:  I have reviewed the data as listed Lab Results  Component Value Date   WBC 11.5 (H) 08/06/2022   HGB 10.7 (L) 08/06/2022   HCT  33.9 (L) 08/06/2022   MCV 80.9 08/06/2022   PLT 312 08/06/2022   Recent Labs    07/04/22 0454 07/05/22 0455 07/06/22 0442 07/08/22 0411 08/06/22 1306  NA 136   < > 138 137 138  K 4.2   < > 4.4 4.4 3.9  CL 107   < > 108 107 103  CO2 24   < > '25 25 26  '$ GLUCOSE 150*   < > 109* 98 109*  BUN 16   < > 26* 18 15  CREATININE 0.92   < > 0.83 0.83 0.93  CALCIUM 8.8*   < > 8.4* 8.6* 9.1  GFRNONAA >60   < > >60 >60 >60  PROT 6.6  --   --  5.7* 7.5  ALBUMIN 3.1*  --   --  2.6* 3.7  AST 18  --   --  20 19  ALT 12  --   --  20 13  ALKPHOS 62  --   --  55 90  BILITOT 0.6  --   --  0.5 0.3   < > = values in this interval not displayed.    RADIOGRAPHIC STUDIES: I have personally reviewed the radiological images as listed and agreed with the findings in the report. No results found.

## 2022-08-07 ENCOUNTER — Other Ambulatory Visit: Payer: Self-pay

## 2022-08-07 ENCOUNTER — Encounter
Admission: RE | Admit: 2022-08-07 | Discharge: 2022-08-07 | Disposition: A | Discharge status: Home / Self Care | Payer: PPO | Source: Ambulatory Visit | Attending: Surgery | Admitting: Surgery

## 2022-08-07 DIAGNOSIS — R011 Cardiac murmur, unspecified: Secondary | ICD-10-CM

## 2022-08-07 DIAGNOSIS — Z01810 Encounter for preprocedural cardiovascular examination: Secondary | ICD-10-CM

## 2022-08-07 HISTORY — DX: Cardiac murmur, unspecified: R01.1

## 2022-08-07 HISTORY — DX: Colostomy status: Z93.3

## 2022-08-07 HISTORY — DX: Unspecified severe protein-calorie malnutrition: E43

## 2022-08-07 HISTORY — DX: Iron deficiency anemia, unspecified: D50.9

## 2022-08-07 MED ORDER — FAMOTIDINE 20 MG PO TABS: 20.0000 mg | ORAL_TABLET | Freq: Once | ORAL | Status: AC

## 2022-08-07 MED ORDER — ORAL CARE MOUTH RINSE: 15.0000 mL | Freq: Once | OROMUCOSAL | Status: AC

## 2022-08-07 MED ORDER — LACTATED RINGERS IV SOLN: INTRAVENOUS | Status: DC

## 2022-08-07 MED ORDER — CHLORHEXIDINE GLUCONATE CLOTH 2 % EX PADS
6.0000 | MEDICATED_PAD | Freq: Once | CUTANEOUS | Status: AC
  Administered 2022-08-08: 6 via TOPICAL

## 2022-08-07 MED ORDER — CHLORHEXIDINE GLUCONATE 0.12 % MT SOLN: 15.0000 mL | Freq: Once | OROMUCOSAL | Status: AC

## 2022-08-07 MED ORDER — CEFAZOLIN SODIUM-DEXTROSE 2-4 GM/100ML-% IV SOLN
2.0000 g | INTRAVENOUS | Status: AC
  Administered 2022-08-08: 2 g via INTRAVENOUS

## 2022-08-07 NOTE — Patient Instructions (Signed)
Your procedure is scheduled on:08-08-22 Thursday Report to the Registration Desk on the 1st floor of the [facility].Then proceed to the 2nd floor Surgery Desk To find out your arrival time, please call [phone] between 1PM - 3PM on:08-07-22 Wednesday If your arrival time is 6:00 am, do not arrive prior to that time as the [facility] entrance doors do not open until 6:00 am.  REMEMBER: Instructions that are not followed completely may result in serious medical risk, up to and including death; or upon the discretion of your surgeon and anesthesiologist your surgery may need to be rescheduled.  Do not eat food after midnight the night before surgery.  No gum chewing, lozengers or hard candies.  You may however, drink CLEAR liquids up to 2 hours before you are scheduled to arrive for your surgery. Do not drink anything within 2 hours of your scheduled arrival time.  Clear liquids include: - water  - apple juice without pulp - gatorade (not RED colors) - black coffee or tea (Do NOT add milk or creamers to the coffee or tea) Do NOT drink anything that is not on this list.  Do NOT take any medication the day of surgery  One week prior to surgery: Stop Anti-inflammatories (NSAIDS) such as Advil, Aleve, Ibuprofen, Motrin, Naproxen, Naprosyn and Aspirin based products such as Excedrin, Goodys Powder, BC Powder You may however, continue to take Tylenol if needed for pain up until the day of surgery.  No Alcohol for 24 hours before or after surgery.  No Smoking including e-cigarettes for 24 hours prior to surgery.  No chewable tobacco products for at least 6 hours prior to surgery.  No nicotine patches on the day of surgery.  Do not use any "recreational" drugs for at least a week prior to your surgery.  Please be advised that the combination of cocaine and anesthesia may have negative outcomes, up to and including death. If you test positive for cocaine, your surgery will be  cancelled.  On the morning of surgery brush your teeth with toothpaste and water, you may rinse your mouth with mouthwash if you wish. Do not swallow any toothpaste or mouthwash.  Do not wear jewelry, make-up, hairpins, clips or nail polish.  Do not wear lotions, powders, or perfumes.   Do not shave body from the neck down 48 hours prior to surgery just in case you cut yourself which could leave a site for infection.  Also, freshly shaved skin may become irritated if using the CHG soap.  Contact lenses, hearing aids and dentures may not be worn into surgery.  Do not bring valuables to the hospital. [facility] is not responsible for any missing/lost belongings or valuables. .   Notify your doctor if there is any change in your medical condition (cold, fever, infection).  Wear comfortable clothing (specific to your surgery type) to the hospital.  After surgery, you can help prevent lung complications by doing breathing exercises.  Take deep breaths and cough every 1-2 hours. Your doctor may order a device called an Incentive Spirometer to help you take deep breaths. When coughing or sneezing, hold a pillow firmly against your incision with both hands. This is called "splinting." Doing this helps protect your incision. It also decreases belly discomfort.  If you are being admitted to the hospital overnight, leave your suitcase in the car. After surgery it may be brought to your room.  If you are being discharged the day of surgery, you will not  be allowed to drive home. You will need a responsible adult (18 years or older) to drive you home and stay with you that night.   If you are taking public transportation, you will need to have a responsible adult (18 years or older) with you. Please confirm with your physician that it is acceptable to use public transportation.   Please call the [facility] Dept. at [phone] if you have any questions about these  instructions.  Surgery Visitation Policy:  Patients undergoing a surgery or procedure may have two family members or support persons with them as long as the person is not COVID-19 positive or experiencing its symptoms.   Due to an increase in RSV and influenza rates and associated hospitalizations, children ages 88 and under will not be able to visit patients in [facility]. Masks continue to be strongly recommended.

## 2022-08-08 ENCOUNTER — Other Ambulatory Visit: Payer: Self-pay

## 2022-08-08 ENCOUNTER — Ambulatory Visit: Payer: PPO

## 2022-08-08 ENCOUNTER — Ambulatory Visit: Payer: PPO | Admitting: Anesthesiology

## 2022-08-08 ENCOUNTER — Encounter
Admission: RE | Disposition: A | Discharge status: Home / Self Care | Payer: Self-pay | Source: Home / Self Care | Attending: Surgery

## 2022-08-08 ENCOUNTER — Other Ambulatory Visit: Payer: PPO

## 2022-08-08 ENCOUNTER — Ambulatory Visit
Admission: RE | Admit: 2022-08-08 | Discharge: 2022-08-08 | Disposition: A | Discharge status: Home / Self Care | Payer: PPO | Attending: Surgery | Admitting: Surgery

## 2022-08-08 ENCOUNTER — Encounter: Payer: Self-pay | Admitting: Surgery

## 2022-08-08 DIAGNOSIS — C184 Malignant neoplasm of transverse colon: Secondary | ICD-10-CM | POA: Diagnosis not present

## 2022-08-08 DIAGNOSIS — I771 Stricture of artery: Secondary | ICD-10-CM | POA: Diagnosis not present

## 2022-08-08 DIAGNOSIS — R011 Cardiac murmur, unspecified: Secondary | ICD-10-CM

## 2022-08-08 DIAGNOSIS — Z452 Encounter for adjustment and management of vascular access device: Secondary | ICD-10-CM | POA: Diagnosis not present

## 2022-08-08 DIAGNOSIS — Z01810 Encounter for preprocedural cardiovascular examination: Secondary | ICD-10-CM

## 2022-08-08 HISTORY — PX: PORTACATH PLACEMENT: SHX2246

## 2022-08-08 SURGERY — INSERTION, TUNNELED CENTRAL VENOUS DEVICE, WITH PORT
Anesthesia: General | Site: Chest | Laterality: Right

## 2022-08-08 MED ORDER — PROPOFOL 10 MG/ML IV BOLUS
INTRAVENOUS | Status: DC | PRN
  Administered 2022-08-08: 30 mg via INTRAVENOUS
  Administered 2022-08-08: 50 mg via INTRAVENOUS

## 2022-08-08 MED ORDER — SODIUM CHLORIDE FLUSH 0.9 % IV SOLN
INTRAVENOUS | Status: AC
  Filled 2022-08-08: qty 10

## 2022-08-08 MED ORDER — OXYCODONE HCL 5 MG/5ML PO SOLN: 5.0000 mg | Freq: Once | ORAL | Status: DC | PRN

## 2022-08-08 MED ORDER — ONDANSETRON HCL 4 MG/2ML IJ SOLN
INTRAMUSCULAR | Status: DC | PRN
  Administered 2022-08-08: 4 mg via INTRAVENOUS

## 2022-08-08 MED ORDER — FENTANYL CITRATE (PF) 100 MCG/2ML IJ SOLN: 25.0000 ug | INTRAMUSCULAR | Status: DC | PRN

## 2022-08-08 MED ORDER — ACETAMINOPHEN 10 MG/ML IV SOLN
INTRAVENOUS | Status: DC | PRN
  Administered 2022-08-08: 1000 mg via INTRAVENOUS

## 2022-08-08 MED ORDER — PHENYLEPHRINE HCL (PRESSORS) 10 MG/ML IV SOLN
INTRAVENOUS | Status: DC | PRN
  Administered 2022-08-08 (×4): 80 ug via INTRAVENOUS

## 2022-08-08 MED ORDER — PROPOFOL 10 MG/ML IV BOLUS
INTRAVENOUS | Status: AC
  Filled 2022-08-08: qty 20

## 2022-08-08 MED ORDER — ACETAMINOPHEN 10 MG/ML IV SOLN
INTRAVENOUS | Status: AC
  Filled 2022-08-08: qty 100

## 2022-08-08 MED ORDER — PROPOFOL 10 MG/ML IV BOLUS
INTRAVENOUS | Status: AC
  Filled 2022-08-08: qty 40

## 2022-08-08 MED ORDER — CHLORHEXIDINE GLUCONATE 0.12 % MT SOLN
OROMUCOSAL | Status: AC
  Administered 2022-08-08: 15 mL via OROMUCOSAL
  Filled 2022-08-08: qty 15

## 2022-08-08 MED ORDER — EPHEDRINE SULFATE (PRESSORS) 50 MG/ML IJ SOLN
INTRAMUSCULAR | Status: DC | PRN
  Administered 2022-08-08 (×2): 5 mg via INTRAVENOUS

## 2022-08-08 MED ORDER — CEFAZOLIN SODIUM-DEXTROSE 2-4 GM/100ML-% IV SOLN
INTRAVENOUS | Status: AC
  Filled 2022-08-08: qty 100

## 2022-08-08 MED ORDER — HEPARIN SODIUM (PORCINE) 5000 UNIT/ML IJ SOLN
INTRAMUSCULAR | Status: AC
  Filled 2022-08-08: qty 1

## 2022-08-08 MED ORDER — BUPIVACAINE HCL (PF) 0.5 % IJ SOLN
INTRAMUSCULAR | Status: AC
  Filled 2022-08-08: qty 30

## 2022-08-08 MED ORDER — OXYCODONE HCL 5 MG PO TABS: 5.0000 mg | ORAL_TABLET | Freq: Once | ORAL | Status: DC | PRN

## 2022-08-08 MED ORDER — SODIUM CHLORIDE 0.9 % IR SOLN
Status: DC | PRN
  Administered 2022-08-08: 5 mL

## 2022-08-08 MED ORDER — FAMOTIDINE 20 MG PO TABS
ORAL_TABLET | ORAL | Status: AC
  Administered 2022-08-08: 20 mg via ORAL
  Filled 2022-08-08: qty 1

## 2022-08-08 MED ORDER — LIDOCAINE-EPINEPHRINE (PF) 1 %-1:200000 IJ SOLN
INTRAMUSCULAR | Status: DC | PRN
  Administered 2022-08-08: 13 mL via INTRAMUSCULAR

## 2022-08-08 MED ORDER — PROPOFOL 500 MG/50ML IV EMUL
INTRAVENOUS | Status: DC | PRN
  Administered 2022-08-08: 60 ug/kg/min via INTRAVENOUS

## 2022-08-08 MED ORDER — FENTANYL CITRATE (PF) 100 MCG/2ML IJ SOLN
INTRAMUSCULAR | Status: AC
  Filled 2022-08-08: qty 2

## 2022-08-08 MED ORDER — LIDOCAINE-EPINEPHRINE 1 %-1:100000 IJ SOLN
INTRAMUSCULAR | Status: AC
  Filled 2022-08-08: qty 1

## 2022-08-08 MED ORDER — FENTANYL CITRATE (PF) 100 MCG/2ML IJ SOLN
INTRAMUSCULAR | Status: DC | PRN
  Administered 2022-08-08 (×4): 25 ug via INTRAVENOUS

## 2022-08-08 MED ORDER — DEXAMETHASONE SODIUM PHOSPHATE 10 MG/ML IJ SOLN
INTRAMUSCULAR | Status: DC | PRN
  Administered 2022-08-08: 5 mg via INTRAVENOUS

## 2022-08-08 SURGICAL SUPPLY — 38 items
APL PRP STRL LF DISP 70% ISPRP (MISCELLANEOUS) ×1
APL SKNCLS STERI-STRIP NONHPOA (GAUZE/BANDAGES/DRESSINGS) ×1
BAG DECANTER FOR FLEXI CONT (MISCELLANEOUS) ×1 IMPLANT
BENZOIN TINCTURE PRP APPL 2/3 (GAUZE/BANDAGES/DRESSINGS) ×1 IMPLANT
BLADE CLIPPER SURG (BLADE) IMPLANT
BLADE SURG SZ11 CARB STEEL (BLADE) ×1 IMPLANT
BOOT SUTURE AID YELLOW STND (SUTURE) ×1 IMPLANT
CHLORAPREP W/TINT 26 (MISCELLANEOUS) ×1 IMPLANT
COVER LIGHT HANDLE STERIS (MISCELLANEOUS) ×2 IMPLANT
DRAPE C-ARM XRAY 36X54 (DRAPES) ×1 IMPLANT
DRSG TEGADERM 2-3/8X2-3/4 SM (GAUZE/BANDAGES/DRESSINGS) IMPLANT
DRSG TEGADERM 4X4.75 (GAUZE/BANDAGES/DRESSINGS) ×1 IMPLANT
ELECT CAUTERY BLADE 6.4 (BLADE) ×1 IMPLANT
ELECT REM PT RETURN 9FT ADLT (ELECTROSURGICAL) ×1
ELECTRODE REM PT RTRN 9FT ADLT (ELECTROSURGICAL) ×1 IMPLANT
GAUZE 4X4 16PLY ~~LOC~~+RFID DBL (SPONGE) ×1 IMPLANT
GAUZE SPONGE 4X4 12PLY STRL (GAUZE/BANDAGES/DRESSINGS) IMPLANT
GLOVE BIOGEL PI IND STRL 7.0 (GLOVE) ×1 IMPLANT
GLOVE SURG SYN 6.5 ES PF (GLOVE) ×1 IMPLANT
GLOVE SURG SYN 6.5 PF PI (GLOVE) ×1 IMPLANT
GOWN STRL REUS W/ TWL LRG LVL3 (GOWN DISPOSABLE) ×2 IMPLANT
GOWN STRL REUS W/TWL LRG LVL3 (GOWN DISPOSABLE) ×2
IV NS 500ML (IV SOLUTION) ×1
IV NS 500ML BAXH (IV SOLUTION) ×1 IMPLANT
KIT PORT POWER 8FR ISP CVUE (Port) ×1 IMPLANT
KIT TURNOVER KIT A (KITS) ×1 IMPLANT
LABEL OR SOLS (LABEL) ×1 IMPLANT
MANIFOLD NEPTUNE II (INSTRUMENTS) ×1 IMPLANT
PACK PORT-A-CATH (MISCELLANEOUS) ×1 IMPLANT
SPIKE FLUID TRANSFER (MISCELLANEOUS) ×1 IMPLANT
SPONGE VERSALON 4X4 4PLY (MISCELLANEOUS) ×1 IMPLANT
STRIP CLOSURE SKIN 1/2X4 (GAUZE/BANDAGES/DRESSINGS) ×1 IMPLANT
SUT MNCRL AB 4-0 PS2 18 (SUTURE) ×1 IMPLANT
SUT PROLENE 2 0 SH DA (SUTURE) ×1 IMPLANT
SUT VIC AB 3-0 SH 27 (SUTURE) ×1
SUT VIC AB 3-0 SH 27X BRD (SUTURE) ×1 IMPLANT
SYR 10ML LL (SYRINGE) ×1 IMPLANT
TRAP FLUID SMOKE EVACUATOR (MISCELLANEOUS) ×1 IMPLANT

## 2022-08-08 NOTE — Discharge Instructions (Addendum)
Port placement, Care After This sheet gives you information about how to care for yourself after your procedure. Your health care provider may also give you more specific instructions. If you have problems or questions, contact your health care provider. What can I expect after the procedure? After the procedure, it is common to have: Soreness. Bruising. Itching. Follow these instructions at home: site care Follow instructions from your health care provider about how to take care of your site. Make sure you: Wash your hands with soap and water before and after you change your bandage (dressing). If soap and water are not available, use hand sanitizer. Leave stitches (sutures), skin glue, or adhesive strips in place. These skin closures may need to stay in place for 2 weeks or longer. If adhesive strip edges start to loosen and curl up, you may trim the loose edges. Do not remove adhesive strips completely unless your health care provider tells you to do that. If the area bleeds or bruises, apply gentle pressure for 10 minutes. Leave dressing intact for 48hrs, then ok to remove and shower. Keep steristrips in place  Check your site every day for signs of infection. Check for: Redness, swelling, or pain. Fluid or blood. Warmth. Pus or a bad smell.  General instructions Rest and then return to your normal activities as told by your health care provider.  tylenol and advil as needed for discomfort.  Please alternate between the two every four hours as needed for pain.    Use narcotics, if prescribed, only when tylenol and motrin is not enough to control pain.  325-'650mg'$  every 8hrs to max of '3000mg'$ /24hrs (including the '325mg'$  in every norco dose) for the tylenol.    Advil up to '800mg'$  per dose every 8hrs as needed for pain.   Keep all follow-up visits as told by your health care provider. This is important. Contact a health care provider if: You have redness, swelling, or pain around your  site. You have fluid or blood coming from your site. Your site feels warm to the touch. You have pus or a bad smell coming from your site. You have a fever. Your sutures, skin glue, or adhesive strips loosen or come off sooner than expected. Get help right away if: You have bleeding that does not stop with pressure or a dressing. Summary After the procedure, it is common to have some soreness, bruising, and itching at the site. Follow instructions from your health care provider about how to take care of your site. Check your site every day for signs of infection. Contact a health care provider if you have redness, swelling, or pain around your site, or your site feels warm to the touch. Keep all follow-up visits as told by your health care provider. This is important. This information is not intended to replace advice given to you by your health care provider. Make sure you discuss any questions you have with your health care provider. Document Released: 08/04/2015 Document Revised: 01/05/2018 Document Reviewed: 01/05/2018 Elsevier Interactive Patient Education  2019 [place]   The drugs that you were given will stay in your system until tomorrow so for the next 24 hours you should not:  Drive an automobile Make any legal decisions Drink any alcoholic beverage   You may resume regular meals tomorrow.  Today it is better to start with liquids and gradually work up to solid foods.  You may eat anything you prefer, but it is  better to start with liquids, then soup and crackers, and gradually work up to solid foods.   Please notify your doctor immediately if you have any unusual bleeding, trouble breathing, redness and pain at the surgery site, drainage, fever, or pain not relieved by medication.    Your post-operative visit with Dr.                                       is: Date:                        Time:    Please call to  schedule your post-operative visit.  Additional Instructions:

## 2022-08-08 NOTE — Transfer of Care (Signed)
Immediate Anesthesia Transfer of Care Note  Patient: [name]  Procedure(s) Performed: INSERTION PORT-A-CATH (Right: Chest)  Patient Location: PACU  Anesthesia Type:General  Level of Consciousness: awake, drowsy, and patient cooperative  Airway & Oxygen Therapy: Patient Spontanous Breathing  Post-op Assessment: Report given to RN, Post -op Vital signs reviewed and stable, and Patient moving all extremities  Post vital signs: Reviewed and stable  Last Vitals:  Vitals Value Taken Time  BP 105/77 08/08/22 1150  Temp 36.3 C 08/08/22 1150  Pulse 75 08/08/22 1155  Resp 13 08/08/22 1153  SpO2 96 % 08/08/22 1155  Vitals shown include unvalidated device data.  Last Pain:  Vitals:   08/08/22 1150  TempSrc:   PainSc: 0-No pain         Complications: No notable events documented.

## 2022-08-08 NOTE — Anesthesia Postprocedure Evaluation (Signed)
Anesthesia Post Note  Patient: DEZMON CONOVER  Procedure(s) Performed: INSERTION PORT-A-CATH (Right: Chest)  Patient location during evaluation: PACU Anesthesia Type: General Level of consciousness: awake and alert Pain management: pain level controlled Vital Signs Assessment: post-procedure vital signs reviewed and stable Respiratory status: spontaneous breathing, nonlabored ventilation, respiratory function stable and patient connected to nasal cannula oxygen Cardiovascular status: blood pressure returned to baseline and stable Postop Assessment: no apparent nausea or vomiting Anesthetic complications: no   No notable events documented.   Last Vitals:  Vitals:   08/08/22 1212 08/08/22 1229  BP: 108/72 121/79  Pulse: 68   Resp: 13 16  Temp: (!) 36.3 C (!) 36.3 C  SpO2: 97%     Last Pain:  Vitals:   08/08/22 1229  TempSrc: Tympanic  PainSc: 0-No pain                 Precious Haws Jovee Dettinger

## 2022-08-08 NOTE — Op Note (Signed)
--  OP NOTE  DATE OF PROCEDURE: 08/08/2022   SURGEON: Lysle Pearl  ANESTHESIA: LMA  PRE-OPERATIVE DIAGNOSIS: recurrent colon cancer requring port for chemotherapy   POST-OPERATIVE DIAGNOSIS: same  PROCEDURE(S):  1.) Percutaneous access of RIJ vein under ultrasound guidance   2.) Insertion of tunneled RIJ  central venous catheter with subcutaneous port  INTRAOPERATIVE FINDINGS: Patent easily compressible RIJ  vein with appropriate respiratory variations and well-secured tunneled central venous catheter with subcutaneous port at completion of the procedure, heplocked after confirming ease of draw and push  ESTIMATED BLOOD LOSS: Minimal (<20 mL)   SPECIMENS: None   IMPLANTS: 24F tunneled Bard PowerPort central venous catheter with subcutaneous port  DRAINS: None   COMPLICATIONS: None apparent   CONDITION AT COMPLETION: Hemodynamically stable, awake   DISPOSITION: PACU   INDICATION(S) FOR PROCEDURE:  Patient is a 72 y.o. male who presented with above diagnosis.  All risks, benefits, and alternatives to above elective procedures were discussed with the patient, who elected to proceed, and informed consent was accordingly obtained at that time.  DETAILS OF PROCEDURE:  Patient was brought to the operative suite and appropriately identified. In Trendelenburg position, Right IJ venous access site was prepped and draped in the usual sterile fashion, and following a timeout, percutaneous venous access was obtained under ultrasound guidance using Seldinger technique, by which local anesthetic was injected over the area, and access needle was inserted under direct ultrasound visualization through which soft guidewire was advanced with no resistance, over which access needle was withdrawn. Guidewire was secured, attention was directed to injection of local anesthetic along the planned tunnel site, 2-3 cm transverse right chest incision was made and confirmed to accommodate the subcutaneous port, and  flushed catheter was tunneled retrograde from the port site over to the vein access site. Insertion sheath was advanced over the guidewire, which was withdrawn along with the insertion sheath dilator with no resistance. The catheter was introduced through the sheath and tip left in the [place] junction under fluoro guidance and catheter cut to appropriate length.  Catheter connected to port and placed within planned fixation site.  Fluro confirmed no kink within the entire length of the catheter at this point.  Port fixed to the pocket on two side to avoid twisting. Port was confirmed to withdraw blood and flush easily with included Heuber needle, and port heplocked. Dermis at the subcutaneous pocket was re-approximated using buried interrupted 3-0 Vicryl suture, and 4-0 Monocryl suture was used to re-approximate skin at the insertion and subcutaneous port sites in running subcuticular fashion.  Incisions then dressed with steristrips, 2x2, and tegaderm. Patient was then awakened from anesthesia and transferred to PACU in stable condition.  [place] images saved in paper chart and fluoro images saved in Epic.  CXR post op confirmed proper placement of port and no evidence of pneumothorax.

## 2022-08-08 NOTE — Interval H&P Note (Signed)
No change. OK to proceed.

## 2022-08-08 NOTE — Anesthesia Preprocedure Evaluation (Signed)
Anesthesia Evaluation  Patient identified by MRN, date of birth, ID band Patient awake    Reviewed: Allergy & Precautions, NPO status , Patient's Chart, lab work & pertinent test results  History of Anesthesia Complications Negative for: history of anesthetic complications  Airway Mallampati: III  TM Distance: >3 FB Neck ROM: full    Dental  (+) Chipped   Pulmonary neg pulmonary ROS, neg shortness of breath   Pulmonary exam normal        Cardiovascular Exercise Tolerance: Good (-) angina (-) Past MI and (-) DOE Normal cardiovascular exam     Neuro/Psych negative neurological ROS  negative psych ROS   GI/Hepatic negative GI ROS, Neg liver ROS,neg GERD  ,,  Endo/Other  negative endocrine ROS    Renal/GU negative Renal ROS  negative genitourinary   Musculoskeletal   Abdominal   Peds  Hematology negative hematology ROS (+)   Anesthesia Other Findings Past Medical History: No date: Colon cancer ([place])     Comment:  remission 2011 No date: Heart murmur     Comment:  as a child from rheumatic fever 2024: Malignant neoplasm of transverse colon (HCC) No date: Microcytic anemia No date: Neuropathy No date: 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HCC) No date: Protein-calorie malnutrition, severe ([place])  Past Surgical History: No date: APPENDECTOMY 07/03/2022: COLECTOMY WITH COLOSTOMY CREATION/HARTMANN PROCEDURE; N/A     Comment:  Procedure: COLECTOMY WITH COLOSTOMY CREATION/HARTMANN               PROCEDURE;  Surgeon: Benjamine Sprague, DO;  Location: [facility]               ORS;  Service: General;  Laterality: N/A; 06/29/2022: COLONOSCOPY; N/A     Comment:  Procedure: COLONOSCOPY;  Surgeon: Benjamine Sprague, DO;                Location: [facility] ENDOSCOPY;  Service: General;  Laterality:              N/A;  BMI    Body Mass Index: 24.78 kg/m      Reproductive/Obstetrics negative OB ROS                              Anesthesia Physical Anesthesia Plan  ASA: 2  Anesthesia Plan: General   Post-op Pain Management:    Induction: Intravenous  PONV Risk Score and Plan: Propofol infusion and TIVA  Airway Management Planned: Natural Airway and Nasal Cannula  Additional Equipment:   Intra-op Plan:   Post-operative Plan:   Informed Consent: I have reviewed the patients History and Physical, chart, labs and discussed the procedure including the risks, benefits and alternatives for the proposed anesthesia with the patient or authorized representative who has indicated his/her understanding and acceptance.     Dental Advisory Given  Plan Discussed with: Anesthesiologist, CRNA and Surgeon  Anesthesia Plan Comments: (Patient consented for risks of anesthesia including but not limited to:  - adverse reactions to medications - risk of airway placement if required - damage to eyes, teeth, lips or other oral mucosa - nerve damage due to positioning  - sore throat or hoarseness - Damage to heart, brain, nerves, lungs, other parts of body or loss of life  Patient voiced understanding.)       Anesthesia Quick Evaluation

## 2022-08-09 ENCOUNTER — Inpatient Hospital Stay: Payer: PPO

## 2022-08-09 DIAGNOSIS — C184 Malignant neoplasm of transverse colon: Secondary | ICD-10-CM

## 2022-08-09 MED ORDER — LIDOCAINE-PRILOCAINE 2.5-2.5 % EX CREA: TOPICAL_CREAM | CUTANEOUS | 3 refills | Status: DC

## 2022-08-09 MED ORDER — ONDANSETRON HCL 8 MG PO TABS: 8.0000 mg | ORAL_TABLET | Freq: Three times a day (TID) | ORAL | 1 refills | Status: DC | PRN

## 2022-08-09 MED ORDER — PROCHLORPERAZINE MALEATE 10 MG PO TABS: 10.0000 mg | ORAL_TABLET | Freq: Four times a day (QID) | ORAL | 1 refills | Status: DC | PRN

## 2022-08-09 MED ORDER — DEXAMETHASONE 4 MG PO TABS: 4.0000 mg | ORAL_TABLET | Freq: Every day | ORAL | 1 refills | Status: DC

## 2022-08-09 MED FILL — Iron Sucrose Inj 20 MG/ML (Fe Equiv): INTRAVENOUS | Qty: 10 | Status: AC

## 2022-08-12 ENCOUNTER — Encounter: Payer: Self-pay | Admitting: Internal Medicine

## 2022-08-12 ENCOUNTER — Inpatient Hospital Stay: Payer: PPO

## 2022-08-12 ENCOUNTER — Inpatient Hospital Stay (HOSPITAL_BASED_OUTPATIENT_CLINIC_OR_DEPARTMENT_OTHER): Payer: PPO | Admitting: Internal Medicine

## 2022-08-12 VITALS — BP 142/91 | HR 83 | Temp 98.5°F | Resp 18 | Wt 165.6 lb

## 2022-08-12 DIAGNOSIS — D509 Iron deficiency anemia, unspecified: Secondary | ICD-10-CM

## 2022-08-12 DIAGNOSIS — C184 Malignant neoplasm of transverse colon: Secondary | ICD-10-CM

## 2022-08-12 DIAGNOSIS — T451X5A Adverse effect of antineoplastic and immunosuppressive drugs, initial encounter: Secondary | ICD-10-CM

## 2022-08-12 DIAGNOSIS — L509 Urticaria, unspecified: Secondary | ICD-10-CM | POA: Diagnosis not present

## 2022-08-12 DIAGNOSIS — G620 Drug-induced polyneuropathy: Secondary | ICD-10-CM | POA: Diagnosis not present

## 2022-08-12 DIAGNOSIS — C189 Malignant neoplasm of colon, unspecified: Secondary | ICD-10-CM | POA: Diagnosis not present

## 2022-08-12 DIAGNOSIS — Z5111 Encounter for antineoplastic chemotherapy: Secondary | ICD-10-CM | POA: Diagnosis not present

## 2022-08-12 LAB — COMPREHENSIVE METABOLIC PANEL
ALT: 12 U/L (ref 0–44)
AST: 25 U/L (ref 15–41)
Albumin: 3.6 g/dL (ref 3.5–5.0)
Alkaline Phosphatase: 77 U/L (ref 38–126)
Anion gap: 11 (ref 5–15)
BUN: 18 mg/dL (ref 8–23)
CO2: 24 mmol/L (ref 22–32)
Calcium: 8.8 mg/dL — ABNORMAL LOW (ref 8.9–10.3)
Chloride: 104 mmol/L (ref 98–111)
Creatinine, Ser: 0.95 mg/dL (ref 0.61–1.24)
GFR, Estimated: >60 mL/min (ref >60)
Glucose, Bld: 135 mg/dL — ABNORMAL HIGH (ref 70–99)
Potassium: 3.6 mmol/L (ref 3.5–5.1)
Sodium: 139 mmol/L (ref 135–145)
Total Bilirubin: 0.4 mg/dL (ref 0.3–1.2)
Total Protein: 7.3 g/dL (ref 6.5–8.1)

## 2022-08-12 LAB — CBC WITH DIFFERENTIAL/PLATELET
Abs Immature Granulocytes: 0.03 10*3/uL (ref 0.00–0.07)
Basophils Absolute: 0.1 10*3/uL (ref 0.0–0.1)
Basophils Relative: 1 %
Eosinophils Absolute: 0.5 10*3/uL (ref 0.0–0.5)
Eosinophils Relative: 5 %
HCT: 33.9 % — ABNORMAL LOW (ref 39.0–52.0)
Hemoglobin: 11 g/dL — ABNORMAL LOW (ref 13.0–17.0)
Immature Granulocytes: 0 %
Lymphocytes Relative: 26 %
Lymphs Abs: 2.6 10*3/uL (ref 0.7–4.0)
MCH: 25.3 pg — ABNORMAL LOW (ref 26.0–34.0)
MCHC: 32.4 g/dL (ref 30.0–36.0)
MCV: 78.1 fL — ABNORMAL LOW (ref 80.0–100.0)
Monocytes Absolute: 0.6 10*3/uL (ref 0.1–1.0)
Monocytes Relative: 6 %
Neutro Abs: 6.3 10*3/uL (ref 1.7–7.7)
Neutrophils Relative %: 62 %
Platelets: 280 10*3/uL (ref 150–400)
RBC: 4.34 MIL/uL (ref 4.22–5.81)
RDW: 18.2 % — ABNORMAL HIGH (ref 11.5–15.5)
WBC: 10.1 10*3/uL (ref 4.0–10.5)
nRBC: 0 % (ref 0.0–0.2)

## 2022-08-12 MED ORDER — LEUCOVORIN CALCIUM INJECTION 350 MG
400.0000 mg/m2 | Freq: Once | INTRAVENOUS | Status: AC
  Administered 2022-08-12: 752 mg via INTRAVENOUS
  Filled 2022-08-12: qty 37.6

## 2022-08-12 MED ORDER — SODIUM CHLORIDE 0.9 % IV SOLN
2400.0000 mg/m2 | INTRAVENOUS | Status: DC
  Administered 2022-08-12: 4500 mg via INTRAVENOUS
  Filled 2022-08-12: qty 90

## 2022-08-12 MED ORDER — DEXTROSE 5 % IV SOLN
Freq: Once | INTRAVENOUS | Status: AC
  Filled 2022-08-12: qty 250

## 2022-08-12 MED ORDER — DEXAMETHASONE 4 MG PO TABS: 4.0000 mg | ORAL_TABLET | Freq: Every day | ORAL | 1 refills | Status: DC

## 2022-08-12 MED ORDER — OXALIPLATIN CHEMO INJECTION 100 MG/20ML
60.0000 mg/m2 | Freq: Once | INTRAVENOUS | Status: AC
  Administered 2022-08-12: 115 mg via INTRAVENOUS
  Filled 2022-08-12: qty 10

## 2022-08-12 MED ORDER — SODIUM CHLORIDE 0.9 % IV SOLN
10.0000 mg | Freq: Once | INTRAVENOUS | Status: AC
  Administered 2022-08-12: 10 mg via INTRAVENOUS
  Filled 2022-08-12: qty 10

## 2022-08-12 MED ORDER — HYDROCODONE-ACETAMINOPHEN 5-325 MG PO TABS: 1.0000 | ORAL_TABLET | Freq: Four times a day (QID) | ORAL | 0 refills | Status: DC | PRN

## 2022-08-12 MED ORDER — PALONOSETRON HCL INJECTION 0.25 MG/5ML
0.2500 mg | Freq: Once | INTRAVENOUS | Status: AC
  Administered 2022-08-12: 0.25 mg via INTRAVENOUS
  Filled 2022-08-12: qty 5

## 2022-08-12 MED ORDER — FLUOROURACIL CHEMO INJECTION 2.5 GM/50ML
400.0000 mg/m2 | Freq: Once | INTRAVENOUS | Status: AC
  Administered 2022-08-12: 750 mg via INTRAVENOUS
  Filled 2022-08-12: qty 15

## 2022-08-12 NOTE — Progress Notes (Addendum)
[facility] NOTE  Patient Care Team: Juluis Pitch, MD as PCP - General (Family Medicine) Clent Jacks, RN as Oncology Nurse Navigator   CANCER STAGING   Cancer Staging  Cancer of transverse colon [facility]) Staging form: Colon and Rectum, AJCC 8th Edition - Clinical: Stage IIIC (cT4b, cN1a, cM0) - Signed by [name], MD on 07/12/2022 Stage prefix: Initial diagnosis Total positive nodes: 1 Total nodes examined: 24   ASSESSMENT & PLAN:  [name] 72 y.o. male with pmh of colon cancer s/p left sided colostomy in 2011, chemotherapy, neuropathy follows with Medical Oncology for Stage IIIC colon cancer.   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 S/p resection and reanastomosis with Dr. Lysle Pearl on 12/13. CT imaging negative for metastasis.  Plan for total 6 months of treatment.  -Patient seen today prior to cycle 1 of FOLFOX. 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  Will dose reduce oxaliplatin to 60 mg/m.  Labs reviewed and acceptable.  Will proceed with cycle 1.  On day 3 patient will come for pump disconnect.   -Patient has antiemetics-Zofran and Compazine.  He will take dexamethasone 4 mg daily for 2 days after chemo.  If he has issues with nausea will consider increasing to 8 mg.  # Urticarial rash on chest -Started after the chest was shaved for port placement.  Discussed about using hydrocortisone cream -He will also be using dexamethasone 4 mg for 2 days after chemo which will also help.  # Arthritis -Patient has multiple arthritic joints including shoulders or hips.  Has been getting steroid injections.  -Refilled Norco every 6 as needed.  #Microcytic anemia -Iron panel consistent with iron deficiency. -Continue with iron pills. Added IV Venofer 200 mg x 2 doses with the chemotherapy for faster recovery of anemia.  #History of colon cancer s/p left sided  colostomy, chemo in 2011 -Surgery and had adjuvant chemotherapy.  No records are available.  # Access -port placed on 08/08/2022 by Dr. Lysle Pearl  Orders Placed This Encounter  Procedures   CBC with Differential    Standing Status:   Future    Standing Expiration Date:   08/27/2023   Comprehensive metabolic panel    Standing Status:   Future    Standing Expiration Date:   08/27/2023   CBC with Differential    Standing Status:   Future    Standing Expiration Date:   09/10/2023   Comprehensive metabolic panel    Standing Status:   Future    Standing Expiration Date:   09/10/2023   CBC with Differential    Standing Status:   Future    Standing Expiration Date:   09/24/2023   Comprehensive metabolic panel    Standing Status:   Future    Standing Expiration Date:   09/24/2023   RTC in 2 weeks for APP visit, labs, cycle 2 of FOLFOX RTC in 4 weeks for covering MD, labs, cycle 3 of FOLFOX RTC in 6 weeks for Dr. Loni Muse, labs, cycle 4 of FOLFOX  The total time spent in the appointment was 30 minutes encounter with patients including review of chart and various tests results, discussions about plan of care and coordination of care plan   All questions were answered. The patient knows to call the clinic with any problems, questions or concerns. No barriers to learning was detected.  [name], MD 1/22/202410:15 AM   HISTORY OF PRESENTING ILLNESS:  Nathan Upperman  Andrews 72 y.o. male with pmh of colon cancer s/p left sided colostomy in 2011, chemotherapy, neuropathy follows with Medical Oncology for Stage IIIC colon cancer.   Interval history  Patient was seen today accompanied by wife prior to cycle 1 of FOLFOX. Patient had port placed with Dr. Lysle Pearl on 1/18.  The chest was shaved prior to port.  He has diffuse urticarial rash on the chest.  Advised to use hydrocortisone.  Mild itching. He has been eating and drinking well. He has baseline arthritic pain in multiple joints.  Has been getting steroid  injections.  I have reviewed his chart and materials related to his cancer extensively and collaborated history with the patient. Summary of oncologic history is as follows: Oncology History Overview Note  Patient has a history of colon cancer in 2011 treated with Dr. Inez Pilgrim with radiation followed by surgery s/p left sided colostomy and adjuvant chemo likely FOLFOX. Records unavailable.    Cancer of transverse colon ([place])  06/29/2022 Initial Diagnosis   Presented to ED for abdominal pain, nausea and vomiting.    Imaging   CT abdo pelvis IMPRESSION: 1. Patient is status post abdominoperineal resection with left lower quadrant ostomy. The ascending and transverse colon are moderately distended and there is abrupt caliber change at the distal transverse colon/splenic flexure where there is irregular masslike thickening. Findings are suspicious for colonic obstruction due to a mass at this location. There is no dilated small bowel. 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 2. Slightly thick-walled presacral fluid collection with peripheral calcification, may reflect chronic postoperative collection. 3. Gallstones. 4. Aortic atherosclerosis.  CT chest IMPRESSION: 1. No acute intrathoracic process. No evidence of intrathoracic metastases.    Procedure   Colonoscopy on 06/29/22 by Dr. Lysle Pearl A fungating partially obstructing mass in mid transverse colon.   07/03/2022 Pathology Results   DIAGNOSIS: A. 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 - SEE CANCER SUMMARY BELOW. - TATTOO INK. - SEPARATE 4.5 CM LENGTH OF SMALL INTESTINE.  CANCER CASE SUMMARY: COLON AND RECTUM Standard(s): AJCC-UICC 8  SPECIMEN Procedure: Transverse colectomy  TUMOR Tumor Site: Transverse colon Histologic Type: Adenocarcinoma Histologic Grade: G2, moderately  differentiated Tumor Size: Greatest dimension: 6.1 cm Tumor Extent: Directly invades or adheres to adjacent structures (small intestine) Macroscopic Tumor Perforation: Not identified Lymphatic and/or Vascular Invasion: Large vessel (venous), extramural Perineural Invasion: Present Tumor Budding Score: High (10 or more) Treatment Effect: No known presurgical therapy  MARGINS Margin Status for Invasive Carcinoma: All margins negative for invasive carcinoma      Margins examined: Proximal, distal, and mesenteric Margin Status for Non-Invasive Tumor: All margins negative for high-grade dysplasia/intramucosal carcinoma and low-grade dysplasia  REGIONAL LYMPH NODES Regional Lymph Nodes: Regional lymph nodes present      Tumor present in regional lymph node(s)           Number of Lymph Nodes with Tumor: 1      Number of Lymph Nodes Examined: 24  Tumor Deposits: Not identified  DISTANT METASTASES Distant Site(s) Involved, if applicable: Not applicable  PATHOLOGIC STAGE CLASSIFICATION (pTNM, AJCC 8th Edition): Modified Classification: Not applicable XT0G      T suffix: Not applicable YI9S pM not applicable - pM cannot be determined from the submitted specimen(s)    07/03/2022 Procedure   Exploratory laparotomy, en bloc colon resection, reanastomosis, primary repair of parastomal hernia by Dr. Lysle Pearl     07/12/2022 Cancer  Staging   Staging form: Colon and Rectum, AJCC 8th Edition - Clinical: Stage IIIC (cT4b, cN1a, cM0) - Signed by [name], MD on 07/12/2022 Stage prefix: Initial diagnosis Total positive nodes: 1 Total nodes examined: 24   08/12/2022 -  Chemotherapy   Patient is on Treatment Plan : COLORECTAL FOLFOX q14d x 6 months       MEDICAL HISTORY:  Past Medical History:  Diagnosis Date   Colon cancer ([place])    remission 2011   Heart murmur    as a child from rheumatic fever   Malignant neoplasm of transverse colon ([place]) 2024   Microcytic anemia     Neuropathy    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[place])    Protein-calorie malnutrition, severe ([place])     SURGICAL HISTORY: Past Surgical History:  Procedure Laterality Date   APPENDECTOMY     COLECTOMY WITH COLOSTOMY CREATION/HARTMANN PROCEDURE N/A 07/03/2022   Procedure: COLECTOMY WITH COLOSTOMY CREATION/HARTMANN PROCEDURE;  Surgeon: Benjamine Sprague, DO;  Location: [facility] ORS;  Service: General;  Laterality: N/A;   COLONOSCOPY N/A 06/29/2022   Procedure: COLONOSCOPY;  Surgeon: Benjamine Sprague, DO;  Location: [facility] ENDOSCOPY;  Service: General;  Laterality: N/A;   PORTACATH PLACEMENT Right 08/08/2022   Procedure: INSERTION PORT-A-CATH;  Surgeon: Benjamine Sprague, DO;  Location: [facility] ORS;  Service: General;  Laterality: Right;    SOCIAL HISTORY: Social History   Socioeconomic History   Marital status: Single    Spouse name: Not on file   Number of children: Not on file   Years of education: Not on file   Highest education level: Not on file  Occupational History   Not on file  Tobacco Use   Smoking status: Never   Smokeless tobacco: Never  Vaping Use   Vaping Use: Never used  Substance and Sexual Activity   Alcohol use: Not Currently   Drug use: Never   Sexual activity: Not on file  Other Topics Concern   Not on file  Social History Narrative   Not on file   Social Determinants of Health   Financial Resource Strain: Not on file  Food Insecurity: Unknown (06/29/2022)   Hunger Vital Sign    Worried About Running Out of Food in the Last Year: Never true    Ran Out of Food in the Last Year: Not on file  Transportation Needs: No Transportation Needs (06/29/2022)   PRAPARE - Hydrologist (Medical): No    Lack of Transportation (Non-Medical): No  Physical Activity: Not on file  Stress: Not on file  Social Connections: Not on file  Intimate Partner Violence: Not At Risk (06/29/2022)   Humiliation, Afraid, Rape, and Kick questionnaire    Fear of Current or Ex-Partner: No     Emotionally Abused: No    Physically Abused: No    Sexually Abused: No    FAMILY HISTORY: History reviewed. No pertinent family history.  ALLERGIES:  has No Known Allergies.  MEDICATIONS:  Current Outpatient Medications  Medication Sig Dispense Refill   Ferrous Sulfate (IRON PO) Take 1 tablet by mouth 3 (three) times a week.     ibuprofen (ADVIL) 200 MG tablet Take 400 mg by mouth every 6 (six) hours as needed.     lidocaine-prilocaine (EMLA) cream Apply to affected area once 30 g 3   Multiple Vitamin (MULTIVITAMIN) tablet Take 1 tablet by mouth daily.     dexamethasone (DECADRON) 4 MG tablet Take 1 tablet (4 mg total) by mouth daily. Start  the day after chemotherapy for 2 days. Take with food. 30 tablet 1   HYDROcodone-acetaminophen (NORCO) 5-325 MG tablet Take 1 tablet by mouth every 6 (six) hours as needed for up to 30 doses for moderate pain. 30 tablet 0   ondansetron (ZOFRAN) 8 MG tablet Take 1 tablet (8 mg total) by mouth every 8 (eight) hours as needed for nausea or vomiting. Start on the third day after chemotherapy. (Patient not taking: Reported on 08/12/2022) 30 tablet 1   prochlorperazine (COMPAZINE) 10 MG tablet Take 1 tablet (10 mg total) by mouth every 6 (six) hours as needed for nausea or vomiting. (Patient not taking: Reported on 08/12/2022) 30 tablet 1   No current facility-administered medications for this visit.   Facility-Administered Medications Ordered in Other Visits  Medication Dose Route Frequency Provider Last Rate Last Admin   fluorouracil (ADRUCIL) 4,500 mg in sodium chloride 0.9 % 60 mL chemo infusion  2,400 mg/m2 (Treatment Plan Recorded) Intravenous 1 day or 1 dose [name], MD       fluorouracil (ADRUCIL) chemo injection 750 mg  400 mg/m2 (Treatment Plan Recorded) Intravenous Once [name], MD       leucovorin 752 mg in dextrose 5 % 250 mL infusion  400 mg/m2 (Treatment Plan Recorded) Intravenous Once [name], MD       oxaliplatin  (ELOXATIN) 115 mg in dextrose 5 % 500 mL chemo infusion  60 mg/m2 (Treatment Plan Recorded) Intravenous Once [name], MD       palonosetron (ALOXI) injection 0.25 mg  0.25 mg Intravenous Once [name], MD        REVIEW OF SYSTEMS:   Pertinent information mentioned in HPI All other systems were reviewed with the patient and are negative.  PHYSICAL EXAMINATION: ECOG PERFORMANCE STATUS: 1 - Symptomatic but completely ambulatory  Vitals:   08/12/22 0905  BP: (!) 142/91  Pulse: 83  Resp: 18  Temp: 98.5 F (36.9 C)  SpO2: 100%   Filed Weights   08/12/22 0905  Weight: 165 lb 9.6 oz (75.1 kg)    GENERAL:alert, no distress and comfortable SKIN: skin color, texture, turgor are normal, no rashes or significant lesions EYES: normal, conjunctiva are pink and non-injected, sclera clear OROPHARYNX:no exudate, no erythema and lips, buccal mucosa, and tongue normal  NECK: supple, thyroid normal size, non-tender, without nodularity LYMPH:  no palpable lymphadenopathy in the cervical, axillary or inguinal LUNGS: clear to auscultation and percussion with normal breathing effort HEART: regular rate & rhythm and no murmurs and no lower extremity edema ABDOMEN:abdomen soft, non-tender and normal bowel sounds Musculoskeletal:no cyanosis of digits and no clubbing  PSYCH: alert & oriented x 3 with fluent speech NEURO: no focal motor/sensory deficits  LABORATORY DATA:  I have reviewed the data as listed Lab Results  Component Value Date   WBC 10.1 08/12/2022   HGB 11.0 (L) 08/12/2022   HCT 33.9 (L) 08/12/2022   MCV 78.1 (L) 08/12/2022   PLT 280 08/12/2022   Recent Labs    07/08/22 0411 08/06/22 1306 08/12/22 0843  NA 137 138 139  K 4.4 3.9 3.6  CL 107 103 104  CO2 '25 26 24  '$ GLUCOSE 98 109* 135*  BUN '18 15 18  '$ CREATININE 0.83 0.93 0.95  CALCIUM 8.6* 9.1 8.8*  GFRNONAA >60 >60 >60  PROT 5.7* 7.5 7.3  ALBUMIN 2.6* 3.7 3.6  AST '20 19 25  '$ ALT '20 13 12  '$ ALKPHOS 55 90  77  BILITOT 0.5 0.3 0.4  RADIOGRAPHIC STUDIES: I have personally reviewed the radiological images as listed and agreed with the findings in the report. DG Chest 1 View  Result Date: 08/08/2022 CLINICAL DATA:  [id number] Port-A-Cath in place [id number] EXAM: CHEST  1 VIEW COMPARISON:  08/25/2009 FINDINGS: Cardiac silhouette is unremarkable. No pneumothorax or pleural effusion. The lungs are clear. Aorta is tortuous. There are thoracic degenerative changes. Right-sided Port-A-Cath tip overlies mid SVC. IMPRESSION: No acute cardiopulmonary process. Electronically Signed   By: Sammie Bench M.D.   On: 08/08/2022 12:14   DG C-Arm 1-60 Min-No Report  Result Date: 08/08/2022 Fluoroscopy was utilized by the requesting physician.  No radiographic interpretation.

## 2022-08-12 NOTE — Progress Notes (Signed)
Patient here today for follow up and treatment consideration regarding colon cancer. Patient denies concerns today.

## 2022-08-12 NOTE — Patient Instructions (Addendum)
[facility]  Discharge Instructions: Thank you for choosing [facility] to provide your oncology and hematology care.  If you have a lab appointment with the [facility], please go directly to the [facility] and check in at the registration area.  Wear comfortable clothing and clothing appropriate for easy access to any Portacath or PICC line.   We strive to give you quality time with your provider. You may need to reschedule your appointment if you arrive late (15 or more minutes).  Arriving late affects you and other patients whose appointments are after yours.  Also, if you miss three or more appointments without notifying the office, you may be dismissed from the clinic at the provider's discretion.      For prescription refill requests, have your pharmacy contact our office and allow 72 hours for refills to be completed.    Today you received the following chemotherapy and/or immunotherapy agents- oxaliplatin, fluorauracil      To help prevent nausea and vomiting after your treatment, we encourage you to take your nausea medication as directed.  BELOW ARE SYMPTOMS THAT SHOULD BE REPORTED IMMEDIATELY: *FEVER GREATER THAN 100.4 F (38 C) OR HIGHER *CHILLS OR SWEATING *NAUSEA AND VOMITING THAT IS NOT CONTROLLED WITH YOUR NAUSEA MEDICATION *UNUSUAL SHORTNESS OF BREATH *UNUSUAL BRUISING OR BLEEDING *URINARY PROBLEMS (pain or burning when urinating, or frequent urination) *BOWEL PROBLEMS (unusual diarrhea, constipation, pain near the anus) TENDERNESS IN MOUTH AND THROAT WITH OR WITHOUT PRESENCE OF ULCERS (sore throat, sores in mouth, or a toothache) UNUSUAL RASH, SWELLING OR PAIN  UNUSUAL VAGINAL DISCHARGE OR ITCHING   Items with * indicate a potential emergency and should be followed up as soon as possible or go to the Emergency Department if any problems should occur.  Please show the CHEMOTHERAPY ALERT CARD or IMMUNOTHERAPY ALERT  CARD at check-in to the Emergency Department and triage nurse.  Should you have questions after your visit or need to cancel or reschedule your appointment, please contact [facility]  [phone] and follow the prompts.  Office hours are 8:00 a.m. to 4:30 p.m. Monday - Friday. Please note that voicemails left after 4:00 p.m. may not be returned until the following business day.  We are closed weekends and major holidays. You have access to a nurse at all times for urgent questions. Please call the main number to the clinic [phone] and follow the prompts.  For any non-urgent questions, you may also contact your provider using MyChart. We now offer e-Visits for anyone 30 and older to request care online for non-urgent symptoms. For details visit [email or web address].   Also download the MyChart app! Go to the app store, search "MyChart", open the app, select [facility], and log in with your MyChart username and password.   Oxaliplatin Injection What is this medication? OXALIPLATIN (ox AL i PLA tin) treats some types of cancer. It works by slowing down the growth of cancer cells. This medicine may be used for other purposes; ask your health care provider or pharmacist if you have questions. COMMON BRAND NAME(S): Eloxatin What should I tell my care team before I take this medication? They need to know if you have any of these conditions: Heart disease History of irregular heartbeat or rhythm Liver disease Low blood cell levels (white cells, red cells, and platelets) Lung or breathing disease, such as asthma Take medications that treat or prevent blood clots Tingling of the fingers,  the fingers, toes, or other nerve disorder An unusual or allergic reaction to oxaliplatin, other medications, foods, dyes, or preservatives If you or your partner are pregnant or trying to get pregnant Breast-feeding How should I use this medication? This medication is injected into a  vein. It is given by your care team in a hospital or clinic setting. Talk to your care team about the use of this medication in children. Special care may be needed. Overdosage: If you think you have taken too much of this medicine contact a poison control center or emergency room at once. NOTE: This medicine is only for you. Do not share this medicine with others. What if I miss a dose? Keep appointments for follow-up doses. It is important not to miss a dose. Call your care team if you are unable to keep an appointment. What may interact with this medication? Do not take this medication with any of the following: Cisapride Dronedarone Pimozide Thioridazine This medication may also interact with the following: Aspirin and aspirin-like medications Certain medications that treat or prevent blood clots, such as warfarin, apixaban, dabigatran, and rivaroxaban Cisplatin Cyclosporine Diuretics Medications for infection, such as acyclovir, adefovir, amphotericin B, bacitracin, cidofovir, foscarnet, ganciclovir, gentamicin, pentamidine, vancomycin NSAIDs, medications for pain and inflammation, such as ibuprofen or naproxen Other medications that cause heart rhythm changes Pamidronate Zoledronic acid This list may not describe all possible interactions. Give your health care provider a list of all the medicines, herbs, non-prescription drugs, or dietary supplements you use. Also tell them if you smoke, drink alcohol, or use illegal drugs. Some items may interact with your medicine. What should I watch for while using this medication? Your condition will be monitored carefully while you are receiving this medication. You may need blood work while taking this medication. This medication may make you feel generally unwell. This is not uncommon as chemotherapy can affect healthy cells as well as cancer cells. Report any side effects. Continue your course of treatment even though you feel ill unless  your care team tells you to stop. This medication may increase your risk of getting an infection. Call your care team for advice if you get a fever, chills, sore throat, or other symptoms of a cold or flu. Do not treat yourself. Try to avoid being around people who are sick. Avoid taking medications that contain aspirin, acetaminophen, ibuprofen, naproxen, or ketoprofen unless instructed by your care team. These medications may hide a fever. Be careful brushing or flossing your teeth or using a toothpick because you may get an infection or bleed more easily. If you have any dental work done, tell your dentist you are receiving this medication. This medication can make you more sensitive to cold. Do not drink cold drinks or use ice. Cover exposed skin before coming in contact with cold temperatures or cold objects. When out in cold weather wear warm clothing and cover your mouth and nose to warm the air that goes into your lungs. Tell your care team if you get sensitive to the cold. Talk to your care team if you or your partner are pregnant or think either of you might be pregnant. This medication can cause serious birth defects if taken during pregnancy and for 9 months after the last dose. A negative pregnancy test is required before starting this medication. A reliable form of contraception is recommended while taking this medication and for 9 months after the last dose. Talk to your care team about effective forms of contraception.   father a child while taking this medication and for 6 months after the last dose. Use a condom while having sex during this time period. Do not breastfeed while taking this medication and for 3 months after the last dose. This medication may cause infertility. Talk to your care team if you are concerned about your fertility. What side effects may I notice from receiving this medication? Side effects that you should report to your care team as soon as possible: Allergic  reactions--skin rash, itching, hives, swelling of the face, lips, tongue, or throat Bleeding--bloody or black, tar-like stools, vomiting blood or brown material that looks like coffee grounds, red or dark brown urine, small red or purple spots on skin, unusual bruising or bleeding Dry cough, shortness of breath or trouble breathing Heart rhythm changes--fast or irregular heartbeat, dizziness, feeling faint or lightheaded, chest pain, trouble breathing Infection--fever, chills, cough, sore throat, wounds that don't heal, pain or trouble when passing urine, general feeling of discomfort or being unwell Liver injury--right upper belly pain, loss of appetite, nausea, light-colored stool, dark yellow or brown urine, yellowing skin or eyes, unusual weakness or fatigue Low red blood cell level--unusual weakness or fatigue, dizziness, headache, trouble breathing Muscle injury--unusual weakness or fatigue, muscle pain, dark yellow or brown urine, decrease in amount of urine Pain, tingling, or numbness in the hands or feet Sudden and severe headache, confusion, change in vision, seizures, which may be signs of posterior reversible encephalopathy syndrome (PRES) Unusual bruising or bleeding Side effects that usually do not require medical attention (report to your care team if they continue or are bothersome): Diarrhea Nausea Pain, redness, or swelling with sores inside the mouth or throat Unusual weakness or fatigue Vomiting This list may not describe all possible side effects. Call your doctor for medical advice about side effects. You may report side effects to FDA at 1-800-FDA-1088. Where should I keep my medication? This medication is given in a hospital or clinic. It will not be stored at home. NOTE: This sheet is a summary. It may not cover all possible information. If you have questions about this medicine, talk to your doctor, pharmacist, or health care provider.  2023 Elsevier/Gold Standard  (2007-08-29 00:00:00)  Fluorouracil Injection What is this medication? FLUOROURACIL (flure oh YOOR a sil) treats some types of cancer. It works by slowing down the growth of cancer cells. This medicine may be used for other purposes; ask your health care provider or pharmacist if you have questions. COMMON BRAND NAME(S): Adrucil What should I tell my care team before I take this medication? They need to know if you have any of these conditions: Blood disorders Dihydropyrimidine dehydrogenase (DPD) deficiency Infection, such as chickenpox, cold sores, herpes Kidney disease Liver disease Poor nutrition Recent or ongoing radiation therapy An unusual or allergic reaction to fluorouracil, other medications, foods, dyes, or preservatives If you or your partner are pregnant or trying to get pregnant Breast-feeding How should I use this medication? This medication is injected into a vein. It is administered by your care team in a hospital or clinic setting. Talk to your care team about the use of this medication in children. Special care may be needed. Overdosage: If you think you have taken too much of this medicine contact a poison control center or emergency room at once. NOTE: This medicine is only for you. Do not share this medicine with others. What if I miss a dose? Keep appointments for follow-up doses. It is important not to miss  your dose. Call your care team if you are unable to keep an appointment. What may interact with this medication? Do not take this medication with any of the following: Live virus vaccines This medication may also interact with the following: Medications that treat or prevent blood clots, such as warfarin, enoxaparin, dalteparin This list may not describe all possible interactions. Give your health care provider a list of all the medicines, herbs, non-prescription drugs, or dietary supplements you use. Also tell them if you smoke, drink alcohol, or use illegal  drugs. Some items may interact with your medicine. What should I watch for while using this medication? Your condition will be monitored carefully while you are receiving this medication. This medication may make you feel generally unwell. This is not uncommon as chemotherapy can affect healthy cells as well as cancer cells. Report any side effects. Continue your course of treatment even though you feel ill unless your care team tells you to stop. In some cases, you may be given additional medications to help with side effects. Follow all directions for their use. This medication may increase your risk of getting an infection. Call your care team for advice if you get a fever, chills, sore throat, or other symptoms of a cold or flu. Do not treat yourself. Try to avoid being around people who are sick. This medication may increase your risk to bruise or bleed. Call your care team if you notice any unusual bleeding. Be careful brushing or flossing your teeth or using a toothpick because you may get an infection or bleed more easily. If you have any dental work done, tell your dentist you are receiving this medication. Avoid taking medications that contain aspirin, acetaminophen, ibuprofen, naproxen, or ketoprofen unless instructed by your care team. These medications may hide a fever. Do not treat diarrhea with over the counter products. Contact your care team if you have diarrhea that lasts more than 2 days or if it is severe and watery. This medication can make you more sensitive to the sun. Keep out of the sun. If you cannot avoid being in the sun, wear protective clothing and sunscreen. Do not use sun lamps, tanning beds, or tanning booths. Talk to your care team if you or your partner wish to become pregnant or think you might be pregnant. This medication can cause serious birth defects if taken during pregnancy and for 3 months after the last dose. A reliable form of contraception is recommended while  taking this medication and for 3 months after the last dose. Talk to your care team about effective forms of contraception. Do not father a child while taking this medication and for 3 months after the last dose. Use a condom while having sex during this time period. Do not breastfeed while taking this medication. This medication may cause infertility. Talk to your care team if you are concerned about your fertility. What side effects may I notice from receiving this medication? Side effects that you should report to your care team as soon as possible: Allergic reactions--skin rash, itching, hives, swelling of the face, lips, tongue, or throat Heart attack--pain or tightness in the chest, shoulders, arms, or jaw, nausea, shortness of breath, cold or clammy skin, feeling faint or lightheaded Heart failure--shortness of breath, swelling of the ankles, feet, or hands, sudden weight gain, unusual weakness or fatigue Heart rhythm changes--fast or irregular heartbeat, dizziness, feeling faint or lightheaded, chest pain, trouble breathing High ammonia level--unusual weakness or fatigue, confusion, loss of  appetite, nausea, vomiting, seizures Infection--fever, chills, cough, sore throat, wounds that don't heal, pain or trouble when passing urine, general feeling of discomfort or being unwell Low red blood cell level--unusual weakness or fatigue, dizziness, headache, trouble breathing Pain, tingling, or numbness in the hands or feet, muscle weakness, change in vision, confusion or trouble speaking, loss of balance or coordination, trouble walking, seizures Redness, swelling, and blistering of the skin over hands and feet Severe or prolonged diarrhea Unusual bruising or bleeding Side effects that usually do not require medical attention (report to your care team if they continue or are bothersome): Dry skin Headache Increased tears Nausea Pain, redness, or swelling with sores inside the mouth or  throat Sensitivity to light Vomiting This list may not describe all possible side effects. Call your doctor for medical advice about side effects. You may report side effects to FDA at 1-800-FDA-1088. Where should I keep my medication? This medication is given in a hospital or clinic. It will not be stored at home. NOTE: This sheet is a summary. It may not cover all possible information. If you have questions about this medicine, talk to your doctor, pharmacist, or health care provider.  2023 Elsevier/Gold Standard (2021-11-06 00:00:00)

## 2022-08-13 ENCOUNTER — Other Ambulatory Visit: Payer: Self-pay

## 2022-08-13 ENCOUNTER — Telehealth: Payer: Self-pay

## 2022-08-13 NOTE — Telephone Encounter (Signed)
Telephone call to patient for follow up after receiving first infusion.   Patient states infusion went great.  States eating good and drinking plenty of fluids.   Denies any nausea or vomiting.  Encouraged patient to call for any concerns or questions. 

## 2022-08-14 ENCOUNTER — Inpatient Hospital Stay: Payer: PPO

## 2022-08-14 VITALS — BP 150/86 | HR 84 | Resp 18

## 2022-08-14 DIAGNOSIS — Z5111 Encounter for antineoplastic chemotherapy: Secondary | ICD-10-CM | POA: Diagnosis not present

## 2022-08-14 DIAGNOSIS — C184 Malignant neoplasm of transverse colon: Secondary | ICD-10-CM

## 2022-08-14 MED ORDER — HEPARIN SOD (PORK) LOCK FLUSH 100 UNIT/ML IV SOLN
500.0000 [IU] | Freq: Once | INTRAVENOUS | Status: AC | PRN
  Administered 2022-08-14: 500 [IU]
  Filled 2022-08-14: qty 5

## 2022-08-14 MED ORDER — SODIUM CHLORIDE 0.9% FLUSH
10.0000 mL | INTRAVENOUS | Status: DC | PRN
  Administered 2022-08-14: 10 mL
  Filled 2022-08-14: qty 10

## 2022-08-19 ENCOUNTER — Encounter: Payer: Self-pay | Admitting: Internal Medicine

## 2022-08-20 ENCOUNTER — Encounter: Payer: Self-pay | Admitting: Surgery

## 2022-08-21 DIAGNOSIS — M25551 Pain in right hip: Secondary | ICD-10-CM | POA: Diagnosis not present

## 2022-08-23 MED FILL — Dexamethasone Sodium Phosphate Inj 100 MG/10ML: INTRAMUSCULAR | Qty: 1 | Status: AC

## 2022-08-26 ENCOUNTER — Inpatient Hospital Stay: Payer: PPO

## 2022-08-26 ENCOUNTER — Encounter: Payer: Self-pay | Admitting: Nurse Practitioner

## 2022-08-26 ENCOUNTER — Inpatient Hospital Stay (HOSPITAL_BASED_OUTPATIENT_CLINIC_OR_DEPARTMENT_OTHER): Payer: PPO | Admitting: Nurse Practitioner

## 2022-08-26 ENCOUNTER — Inpatient Hospital Stay: Payer: PPO | Attending: Internal Medicine

## 2022-08-26 VITALS — BP 149/95 | HR 75 | Temp 97.0°F | Wt 173.0 lb

## 2022-08-26 DIAGNOSIS — C184 Malignant neoplasm of transverse colon: Secondary | ICD-10-CM

## 2022-08-26 DIAGNOSIS — C189 Malignant neoplasm of colon, unspecified: Secondary | ICD-10-CM | POA: Diagnosis not present

## 2022-08-26 DIAGNOSIS — Z452 Encounter for adjustment and management of vascular access device: Secondary | ICD-10-CM | POA: Diagnosis not present

## 2022-08-26 DIAGNOSIS — Z5111 Encounter for antineoplastic chemotherapy: Secondary | ICD-10-CM | POA: Diagnosis not present

## 2022-08-26 DIAGNOSIS — T451X5A Adverse effect of antineoplastic and immunosuppressive drugs, initial encounter: Secondary | ICD-10-CM

## 2022-08-26 DIAGNOSIS — D509 Iron deficiency anemia, unspecified: Secondary | ICD-10-CM

## 2022-08-26 DIAGNOSIS — G620 Drug-induced polyneuropathy: Secondary | ICD-10-CM

## 2022-08-26 LAB — COMPREHENSIVE METABOLIC PANEL
ALT: 14 U/L (ref 0–44)
AST: 21 U/L (ref 15–41)
Albumin: 3.5 g/dL (ref 3.5–5.0)
Alkaline Phosphatase: 81 U/L (ref 38–126)
Anion gap: 6 (ref 5–15)
BUN: 19 mg/dL (ref 8–23)
CO2: 23 mmol/L (ref 22–32)
Calcium: 8.5 mg/dL — ABNORMAL LOW (ref 8.9–10.3)
Chloride: 110 mmol/L (ref 98–111)
Creatinine, Ser: 0.83 mg/dL (ref 0.61–1.24)
GFR, Estimated: >60 mL/min (ref >60)
Glucose, Bld: 95 mg/dL (ref 70–99)
Potassium: 3.8 mmol/L (ref 3.5–5.1)
Sodium: 139 mmol/L (ref 135–145)
Total Bilirubin: 0.3 mg/dL (ref 0.3–1.2)
Total Protein: 6.3 g/dL — ABNORMAL LOW (ref 6.5–8.1)

## 2022-08-26 LAB — CBC WITH DIFFERENTIAL/PLATELET
Abs Immature Granulocytes: 0.01 10*3/uL (ref 0.00–0.07)
Basophils Absolute: 0.1 10*3/uL (ref 0.0–0.1)
Basophils Relative: 1 %
Eosinophils Absolute: 0.5 10*3/uL (ref 0.0–0.5)
Eosinophils Relative: 11 %
HCT: 31 % — ABNORMAL LOW (ref 39.0–52.0)
Hemoglobin: 10 g/dL — ABNORMAL LOW (ref 13.0–17.0)
Immature Granulocytes: 0 %
Lymphocytes Relative: 38 %
Lymphs Abs: 1.8 10*3/uL (ref 0.7–4.0)
MCH: 26.2 pg (ref 26.0–34.0)
MCHC: 32.3 g/dL (ref 30.0–36.0)
MCV: 81.2 fL (ref 80.0–100.0)
Monocytes Absolute: 0.5 10*3/uL (ref 0.1–1.0)
Monocytes Relative: 9 %
Neutro Abs: 2 10*3/uL (ref 1.7–7.7)
Neutrophils Relative %: 41 %
Platelets: 146 10*3/uL — ABNORMAL LOW (ref 150–400)
RBC: 3.82 MIL/uL — ABNORMAL LOW (ref 4.22–5.81)
RDW: 18.9 % — ABNORMAL HIGH (ref 11.5–15.5)
WBC: 4.8 10*3/uL (ref 4.0–10.5)
nRBC: 0 % (ref 0.0–0.2)

## 2022-08-26 MED ORDER — PALONOSETRON HCL INJECTION 0.25 MG/5ML
0.2500 mg | Freq: Once | INTRAVENOUS | Status: AC
  Administered 2022-08-26: 0.25 mg via INTRAVENOUS
  Filled 2022-08-26: qty 5

## 2022-08-26 MED ORDER — FLUOROURACIL CHEMO INJECTION 2.5 GM/50ML
400.0000 mg/m2 | Freq: Once | INTRAVENOUS | Status: AC
  Administered 2022-08-26: 750 mg via INTRAVENOUS
  Filled 2022-08-26: qty 15

## 2022-08-26 MED ORDER — DEXTROSE 5 % IV SOLN
Freq: Once | INTRAVENOUS | Status: AC
  Filled 2022-08-26: qty 250

## 2022-08-26 MED ORDER — SODIUM CHLORIDE 0.9 % IV SOLN
10.0000 mg | Freq: Once | INTRAVENOUS | Status: AC
  Administered 2022-08-26: 10 mg via INTRAVENOUS
  Filled 2022-08-26: qty 10

## 2022-08-26 MED ORDER — MORPHINE SULFATE (PF) 2 MG/ML IV SOLN: 2.0000 mg | Freq: Once | INTRAVENOUS | Status: DC

## 2022-08-26 MED ORDER — SODIUM CHLORIDE 0.9 % IV SOLN
4500.0000 mg | INTRAVENOUS | Status: DC
  Administered 2022-08-26: 4500 mg via INTRAVENOUS
  Filled 2022-08-26: qty 90

## 2022-08-26 MED ORDER — LEUCOVORIN CALCIUM INJECTION 350 MG
400.0000 mg/m2 | Freq: Once | INTRAVENOUS | Status: AC
  Administered 2022-08-26: 752 mg via INTRAVENOUS
  Filled 2022-08-26: qty 37.6

## 2022-08-26 MED ORDER — MORPHINE SULFATE (PF) 2 MG/ML IV SOLN
2.0000 mg | Freq: Once | INTRAVENOUS | Status: AC
  Administered 2022-08-26: 2 mg via INTRAVENOUS
  Filled 2022-08-26: qty 1

## 2022-08-26 MED ORDER — LEUCOVORIN CALCIUM INJECTION 350 MG
400.0000 mg/m2 | Freq: Once | INTRAVENOUS | Status: DC
  Filled 2022-08-26: qty 37.6

## 2022-08-26 MED ORDER — OXALIPLATIN CHEMO INJECTION 100 MG/20ML
60.0000 mg/m2 | Freq: Once | INTRAVENOUS | Status: AC
  Administered 2022-08-26: 115 mg via INTRAVENOUS
  Filled 2022-08-26: qty 23

## 2022-08-26 NOTE — Patient Instructions (Signed)
[facility]  Discharge Instructions: Thank you for choosing [facility] to provide your oncology and hematology care.  If you have a lab appointment with the Cancer Center, please go directly to the Cancer Center and check in at the registration area.  Wear comfortable clothing and clothing appropriate for easy access to any Portacath or PICC line.   We strive to give you quality time with your provider. You may need to reschedule your appointment if you arrive late (15 or more minutes).  Arriving late affects you and other patients whose appointments are after yours.  Also, if you miss three or more appointments without notifying the office, you may be dismissed from the clinic at the provider's discretion.      For prescription refill requests, have your pharmacy contact our office and allow 72 hours for refills to be completed.    Today you received the following chemotherapy and/or immunotherapy agents- oxaliplatin, leucovorin, 5FU      To help prevent nausea and vomiting after your treatment, we encourage you to take your nausea medication as directed.  BELOW ARE SYMPTOMS THAT SHOULD BE REPORTED IMMEDIATELY: *FEVER GREATER THAN 100.4 F (38 C) OR HIGHER *CHILLS OR SWEATING *NAUSEA AND VOMITING THAT IS NOT CONTROLLED WITH YOUR NAUSEA MEDICATION *UNUSUAL SHORTNESS OF BREATH *UNUSUAL BRUISING OR BLEEDING *URINARY PROBLEMS (pain or burning when urinating, or frequent urination) *BOWEL PROBLEMS (unusual diarrhea, constipation, pain near the anus) TENDERNESS IN MOUTH AND THROAT WITH OR WITHOUT PRESENCE OF ULCERS (sore throat, sores in mouth, or a toothache) UNUSUAL RASH, SWELLING OR PAIN  UNUSUAL VAGINAL DISCHARGE OR ITCHING   Items with * indicate a potential emergency and should be followed up as soon as possible or go to the Emergency Department if any problems should occur.  Please show the CHEMOTHERAPY ALERT CARD or IMMUNOTHERAPY ALERT  CARD at check-in to the Emergency Department and triage nurse.  Should you have questions after your visit or need to cancel or reschedule your appointment, please contact [facility]  [phone] and follow the prompts.  Office hours are 8:00 a.m. to 4:30 p.m. Monday - Friday. Please note that voicemails left after 4:00 p.m. may not be returned until the following business day.  We are closed weekends and major holidays. You have access to a nurse at all times for urgent questions. Please call the main number to the clinic [phone] and follow the prompts.  For any non-urgent questions, you may also contact your provider using MyChart. We now offer e-Visits for anyone 18 and older to request care online for non-urgent symptoms. For details visit [email or web address].   Also download the MyChart app! Go to the app store, search "MyChart", open the app, select , and log in with your MyChart username and password.    

## 2022-08-26 NOTE — Progress Notes (Signed)
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 male with pmh of colon cancer s/p left sided colostomy in 2011, chemotherapy, neuropathy follows with Medical Oncology for Stage IIIC colon cancer.   Summary of oncologic history is as follows: Oncology History Overview Note  Patient has a history of colon cancer in 2011 treated with Dr. Inez Pilgrim with radiation followed by surgery s/p left sided colostomy and adjuvant chemo likely FOLFOX. Records unavailable.    Cancer of transverse colon ([place])  06/29/2022 Initial Diagnosis   Presented to ED for abdominal pain, nausea and vomiting.    Imaging   CT abdo pelvis IMPRESSION: 1. Patient is status post abdominoperineal resection with left lower quadrant ostomy. The ascending and transverse colon are moderately distended and there is abrupt caliber change at the distal transverse colon/splenic flexure where there is irregular masslike thickening. Findings are suspicious for colonic obstruction due to a mass at this location. There is no dilated small bowel. 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 2. Slightly thick-walled presacral fluid collection with peripheral calcification, may reflect chronic postoperative collection. 3. Gallstones. 4. Aortic atherosclerosis.  CT chest IMPRESSION: 1. No acute intrathoracic process. No evidence of intrathoracic metastases.    Procedure   Colonoscopy on 06/29/22 by Dr. Lysle Pearl A  fungating partially obstructing mass in mid transverse colon.   07/03/2022 Pathology Results   DIAGNOSIS: A. 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 - SEE CANCER SUMMARY BELOW. - TATTOO INK. - SEPARATE 4.5 CM LENGTH OF SMALL INTESTINE.  CANCER CASE SUMMARY: COLON AND RECTUM Standard(s): AJCC-UICC 8  SPECIMEN Procedure: Transverse colectomy  TUMOR Tumor Site: Transverse colon Histologic Type: Adenocarcinoma Histologic Grade: G2, moderately differentiated Tumor Size: Greatest dimension: 6.1 cm Tumor Extent: Directly invades or adheres to adjacent structures (small intestine) Macroscopic Tumor Perforation: Not identified Lymphatic and/or Vascular Invasion: Large vessel (venous), extramural Perineural Invasion: Present Tumor Budding Score: High (10 or more) Treatment Effect: No known presurgical therapy  MARGINS Margin Status for Invasive Carcinoma: All margins negative for invasive carcinoma      Margins examined: Proximal, distal, and mesenteric Margin Status for Non-Invasive Tumor: All margins negative for high-grade dysplasia/intramucosal carcinoma and low-grade dysplasia  REGIONAL LYMPH NODES Regional Lymph Nodes: Regional lymph nodes present      Tumor present in regional lymph node(s)           Number of Lymph Nodes with Tumor: 1      Number of Lymph Nodes Examined: 24  Tumor Deposits: Not identified  DISTANT METASTASES Distant Site(s) Involved, if applicable: Not applicable  PATHOLOGIC STAGE CLASSIFICATION (pTNM, AJCC 8th Edition): Modified Classification: Not applicable [id number]      T suffix: Not applicable [id number] pM not applicable - pM cannot be determined from the submitted specimen(s)    07/03/2022 Procedure   Exploratory laparotomy, en bloc colon resection, reanastomosis, primary repair of parastomal hernia by Dr. Lysle Pearl     07/12/2022 Cancer Staging   Staging form: Colon and  Rectum, AJCC 8th Edition - Clinical: Stage IIIC (cT4b, cN1a, cM0) - Signed by [name], MD on 07/12/2022 Stage prefix: Initial diagnosis Total positive nodes: 1 Total nodes examined: 24   08/12/2022 -  Chemotherapy   Patient is on Treatment Plan : COLORECTAL FOLFOX q14d x 6 months      Interval history Patient is 72 year old male diagnosed with colon cancer currently s/p cycle 1 FOLFOX chemotherapy who returns to clinic for consideration of cycle 2 of folfox chemotherapy. Tolerated cycle 1 well without significant complications. His chest rash has resolved. Arthritis is stable and unchanged.   Review of Systems  Constitutional:  Positive for malaise/fatigue. Negative for chills, fever and weight loss.  HENT:  Negative for hearing loss, nosebleeds, sore throat and tinnitus.   Eyes:  Negative for blurred vision and double vision.  Respiratory:  Negative for cough, hemoptysis, shortness of breath and wheezing.   Cardiovascular:  Negative for chest pain, palpitations and leg swelling.  Gastrointestinal:  Negative for abdominal pain, blood in stool, constipation, diarrhea, melena, nausea and vomiting.  Genitourinary:  Negative for dysuria and urgency.  Musculoskeletal:  Negative for back pain, falls, joint pain and myalgias.  Skin:  Negative for itching and rash.  Neurological:  Positive for sensory change. Negative for dizziness, tingling, loss of consciousness, weakness and headaches.  Endo/Heme/Allergies:  Negative for environmental allergies. Does not bruise/bleed easily.  Psychiatric/Behavioral:  Negative for depression. The patient is not nervous/anxious and does not have insomnia.     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   Microcytic anemia    Neuropathy    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[facility])    Protein-calorie  malnutrition, severe ([place])     SURGICAL HISTORY: Past Surgical History:  Procedure Laterality Date   APPENDECTOMY     COLECTOMY WITH COLOSTOMY CREATION/HARTMANN PROCEDURE N/A 07/03/2022   Procedure: COLECTOMY WITH COLOSTOMY CREATION/HARTMANN PROCEDURE;  Surgeon: Benjamine Sprague, DO;  Location: [facility] ORS;  Service: General;  Laterality: N/A;   COLONOSCOPY N/A 06/29/2022   Procedure: COLONOSCOPY;  Surgeon: Benjamine Sprague, DO;  Location: [facility] ENDOSCOPY;  Service: General;  Laterality: N/A;   PORTACATH PLACEMENT Right 08/08/2022   Procedure: INSERTION PORT-A-CATH;  Surgeon: Benjamine Sprague, DO;  Location: [facility] ORS;  Service: General;  Laterality: Right;    SOCIAL HISTORY: Social History   Socioeconomic History   Marital status: Single    Spouse name: Not on file   Number of children: Not on file   Years of education: Not on file   Highest education level: Not on file  Occupational History   Not on file  Tobacco Use   Smoking status: Never   Smokeless tobacco: Never  Vaping Use   Vaping Use: Never used  Substance and Sexual Activity   Alcohol use: Not Currently   Drug use: Never   Sexual activity: Not on file  Other Topics Concern   Not on file  Social History Narrative   Not on file   Social Determinants of Health   Financial Resource Strain: Not on file  Food Insecurity: Unknown (06/29/2022)   Hunger Vital Sign    Worried About Running Out of Food in the Last Year: Never true    Ran Out of Food in the Last Year: Not on file  Transportation Needs: No  Transportation Needs (06/29/2022)   PRAPARE - Hydrologist (Medical): No    Lack of Transportation (Non-Medical): No  Physical Activity: Not on file  Stress: Not on file  Social Connections: Not on file  Intimate Partner Violence: Not At Risk (06/29/2022)   Humiliation, Afraid, Rape, and Kick questionnaire    Fear of Current or Ex-Partner: No    Emotionally Abused: No    Physically Abused: No     Sexually Abused: No    FAMILY HISTORY: History reviewed. No pertinent family history.  ALLERGIES:  has No Known Allergies.  MEDICATIONS:  Current Outpatient Medications  Medication Sig Dispense Refill   dexamethasone (DECADRON) 4 MG tablet Take 1 tablet (4 mg total) by mouth daily. Start the day after chemotherapy for 2 days. Take with food. 30 tablet 1   Ferrous Sulfate (IRON PO) Take 1 tablet by mouth 3 (three) times a week.     HYDROcodone-acetaminophen (NORCO) 5-325 MG tablet Take 1 tablet by mouth every 6 (six) hours as needed for up to 30 doses for moderate pain. 30 tablet 0   ibuprofen (ADVIL) 200 MG tablet Take 400 mg by mouth every 6 (six) hours as needed.     lidocaine-prilocaine (EMLA) cream Apply to affected area once 30 g 3   Multiple Vitamin (MULTIVITAMIN) tablet Take 1 tablet by mouth daily.     ondansetron (ZOFRAN) 8 MG tablet Take 1 tablet (8 mg total) by mouth every 8 (eight) hours as needed for nausea or vomiting. Start on the third day after chemotherapy. (Patient not taking: Reported on 08/12/2022) 30 tablet 1   prochlorperazine (COMPAZINE) 10 MG tablet Take 1 tablet (10 mg total) by mouth every 6 (six) hours as needed for nausea or vomiting. (Patient not taking: Reported on 08/12/2022) 30 tablet 1   No current facility-administered medications for this visit.    PHYSICAL EXAMINATION: ECOG PERFORMANCE STATUS: 1 - Symptomatic but completely ambulatory  Vitals:   08/26/22 0827  BP: (!) 149/95  Pulse: 75  Temp: (!) 97 F (36.1 C)  SpO2: 100%   Filed Weights   08/26/22 0827  Weight: 173 lb (78.5 kg)   Physical Exam Constitutional:      Appearance: He is not ill-appearing.  Cardiovascular:     Rate and Rhythm: Normal rate and regular rhythm.  Pulmonary:     Effort: No respiratory distress.  Abdominal:     General: There is no distension.     Tenderness: There is no abdominal tenderness.  Skin:    General: Skin is warm and dry.  Neurological:     Mental  Status: He is alert and oriented to person, place, and time.  Psychiatric:        Mood and Affect: Mood normal.        Behavior: Behavior normal.    LABORATORY DATA:  I have reviewed the data as listed Lab Results  Component Value Date   WBC 4.8 08/26/2022   HGB 10.0 (L) 08/26/2022   HCT 31.0 (L) 08/26/2022   MCV 81.2 08/26/2022   PLT 146 (L) 08/26/2022   Recent Labs    08/06/22 1306 08/12/22 0843 08/26/22 0812  NA 138 139 139  K 3.9 3.6 3.8  CL 103 104 110  CO2 26 24 23   GLUCOSE 109* 135* 95  BUN 15 18 19   CREATININE 0.93 0.95 0.83  CALCIUM 9.1 8.8* 8.5*  GFRNONAA >60 >60 >60  PROT 7.5 7.3 6.3*  ALBUMIN 3.7  3.6 3.5  AST 19 25 21   ALT 13 12 14   ALKPHOS 90 77 81  BILITOT 0.3 0.4 0.3    RADIOGRAPHIC STUDIES: I have personally reviewed the radiological images as listed and agreed with the findings in the report. DG Chest 1 View  Result Date: 08/08/2022 CLINICAL DATA:  [id number] Port-A-Cath in place [id number] EXAM: CHEST  1 VIEW COMPARISON:  08/25/2009 FINDINGS: Cardiac silhouette is unremarkable. No pneumothorax or pleural effusion. The lungs are clear. Aorta is tortuous. There are thoracic degenerative changes. Right-sided Port-A-Cath tip overlies mid SVC. IMPRESSION: No acute cardiopulmonary process. Electronically Signed   By: Sammie Bench M.D.   On: 08/08/2022 12:14   DG C-Arm 1-60 Min-No Report  Result Date: 08/08/2022 Fluoroscopy was utilized by the requesting physician.  No radiographic interpretation.     ASSESSMENT & PLAN:  SHAQWAN HOAK 72 y.o. male with pmh of colon cancer s/p left sided colostomy in 2011, chemotherapy, neuropathy follows with Medical Oncology for Stage IIIC colon cancer.   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 S/p resection and reanastomosis with Dr. Lysle Pearl on 12/13. CT imaging negative for metastasis.  Plan for treatment with FOLFOX chemotherapy q14d x 6 months.    -Patient seen today prior to cycle 2 of FOLFOX. Oxaliplatin dose reduced at cycle 1 d/t baseline neuropathy d/t prior oxaliplatin. Dose reduced to 60mg /m2. Tolerated cycle 1 well. Labs today reviewed and acceptable for treatment. Proceed with cycle 2 of FOLFOX chemotherapy today with pump off on day 3. He will rtc in 2 weeks for port/labs, MD/APP, consideration of cycle 3 folfox with pump off on day 3.    # Chemotherapy induced nausea- reviewed use of zofran and compazine. He will continue dexamethasone 4 mg daily x 2 days after chemo. Start tomorrow. If ongoing issues with nausea, may cnosider increasing to 8 mg.   # Urticarial rash on chest- secondary to shaving and adhesive reaction with port placement. Improved. Can continue topical hydrocortisone cream as prescribed. Dex will also help.   # Arthritis -Patient has multiple arthritic joints including shoulders or hips.  Has been getting steroid injections.  - Continue norco as prescribed.   # Microcytic anemia -Iron panel consistent with iron deficiency. -Continue with iron pills. Added IV Venofer 200 mg x 2 doses with the chemotherapy for faster recovery of anemia. Reviewed risks and benefits.  He'll receive venofer with next 2 cycles of chemo.   # History of colon cancer s/p left sided colostomy - s/p surgery and adjuvant chemotherapy in 2011. No additional records available.   # Access -port placed on 08/08/2022 by Dr. Lysle Pearl  Disposition:  2 weeks- port/lab, Dr Janese Banks, Cycle 3 of FOLFOX chemotherapy   All questions were answered. The patient knows to call the clinic with any problems, questions or concerns. No barriers to learning was detected.  Verlon Au, NP 08/26/2022

## 2022-08-28 ENCOUNTER — Inpatient Hospital Stay: Payer: PPO

## 2022-08-28 VITALS — BP 145/88

## 2022-08-28 DIAGNOSIS — Z5111 Encounter for antineoplastic chemotherapy: Secondary | ICD-10-CM | POA: Diagnosis not present

## 2022-08-28 DIAGNOSIS — C184 Malignant neoplasm of transverse colon: Secondary | ICD-10-CM

## 2022-08-28 MED ORDER — SODIUM CHLORIDE 0.9% FLUSH
10.0000 mL | INTRAVENOUS | Status: DC | PRN
  Administered 2022-08-28: 10 mL
  Filled 2022-08-28: qty 10

## 2022-08-28 MED ORDER — HEPARIN SOD (PORK) LOCK FLUSH 100 UNIT/ML IV SOLN
500.0000 [IU] | Freq: Once | INTRAVENOUS | Status: AC | PRN
  Administered 2022-08-28: 500 [IU]
  Filled 2022-08-28: qty 5

## 2022-09-06 ENCOUNTER — Other Ambulatory Visit: Payer: Self-pay | Admitting: Internal Medicine

## 2022-09-06 MED FILL — Dexamethasone Sodium Phosphate Inj 100 MG/10ML: INTRAMUSCULAR | Qty: 1 | Status: AC

## 2022-09-09 ENCOUNTER — Inpatient Hospital Stay: Payer: PPO

## 2022-09-09 ENCOUNTER — Encounter: Payer: Self-pay | Admitting: Oncology

## 2022-09-09 ENCOUNTER — Inpatient Hospital Stay (HOSPITAL_BASED_OUTPATIENT_CLINIC_OR_DEPARTMENT_OTHER): Payer: PPO | Admitting: Oncology

## 2022-09-09 ENCOUNTER — Other Ambulatory Visit: Payer: Self-pay | Admitting: *Deleted

## 2022-09-09 VITALS — BP 156/98 | HR 68 | Temp 97.7°F | Resp 18 | Ht 68.0 in | Wt 173.3 lb

## 2022-09-09 DIAGNOSIS — C184 Malignant neoplasm of transverse colon: Secondary | ICD-10-CM | POA: Diagnosis not present

## 2022-09-09 DIAGNOSIS — D509 Iron deficiency anemia, unspecified: Secondary | ICD-10-CM

## 2022-09-09 DIAGNOSIS — Z5111 Encounter for antineoplastic chemotherapy: Secondary | ICD-10-CM

## 2022-09-09 DIAGNOSIS — G629 Polyneuropathy, unspecified: Secondary | ICD-10-CM | POA: Diagnosis not present

## 2022-09-09 DIAGNOSIS — C189 Malignant neoplasm of colon, unspecified: Secondary | ICD-10-CM | POA: Diagnosis not present

## 2022-09-09 LAB — CBC WITH DIFFERENTIAL/PLATELET
Abs Immature Granulocytes: 0.01 10*3/uL (ref 0.00–0.07)
Basophils Absolute: 0.1 10*3/uL (ref 0.0–0.1)
Basophils Relative: 2 %
Eosinophils Absolute: 0.2 10*3/uL (ref 0.0–0.5)
Eosinophils Relative: 4 %
HCT: 33.2 % — ABNORMAL LOW (ref 39.0–52.0)
Hemoglobin: 10.7 g/dL — ABNORMAL LOW (ref 13.0–17.0)
Immature Granulocytes: 0 %
Lymphocytes Relative: 39 %
Lymphs Abs: 1.8 10*3/uL (ref 0.7–4.0)
MCH: 27 pg (ref 26.0–34.0)
MCHC: 32.2 g/dL (ref 30.0–36.0)
MCV: 83.6 fL (ref 80.0–100.0)
Monocytes Absolute: 0.6 10*3/uL (ref 0.1–1.0)
Monocytes Relative: 12 %
Neutro Abs: 2 10*3/uL (ref 1.7–7.7)
Neutrophils Relative %: 43 %
Platelets: 151 10*3/uL (ref 150–400)
RBC: 3.97 MIL/uL — ABNORMAL LOW (ref 4.22–5.81)
RDW: 19.5 % — ABNORMAL HIGH (ref 11.5–15.5)
WBC: 4.7 10*3/uL (ref 4.0–10.5)
nRBC: 0 % (ref 0.0–0.2)

## 2022-09-09 LAB — COMPREHENSIVE METABOLIC PANEL
ALT: 14 U/L (ref 0–44)
AST: 20 U/L (ref 15–41)
Albumin: 3.7 g/dL (ref 3.5–5.0)
Alkaline Phosphatase: 78 U/L (ref 38–126)
Anion gap: 6 (ref 5–15)
BUN: 15 mg/dL (ref 8–23)
CO2: 23 mmol/L (ref 22–32)
Calcium: 8.8 mg/dL — ABNORMAL LOW (ref 8.9–10.3)
Chloride: 110 mmol/L (ref 98–111)
Creatinine, Ser: 0.86 mg/dL (ref 0.61–1.24)
GFR, Estimated: >60 mL/min (ref >60)
Glucose, Bld: 103 mg/dL — ABNORMAL HIGH (ref 70–99)
Potassium: 3.6 mmol/L (ref 3.5–5.1)
Sodium: 139 mmol/L (ref 135–145)
Total Bilirubin: 0.6 mg/dL (ref 0.3–1.2)
Total Protein: 6.9 g/dL (ref 6.5–8.1)

## 2022-09-09 MED ORDER — DEXTROSE 5 % IV SOLN
Freq: Once | INTRAVENOUS | Status: AC
  Filled 2022-09-09: qty 250

## 2022-09-09 MED ORDER — SODIUM CHLORIDE 0.9 % IV SOLN
10.0000 mg | Freq: Once | INTRAVENOUS | Status: AC
  Administered 2022-09-09: 10 mg via INTRAVENOUS
  Filled 2022-09-09: qty 10

## 2022-09-09 MED ORDER — SODIUM CHLORIDE 0.9 % IV SOLN
Freq: Once | INTRAVENOUS | Status: AC
  Filled 2022-09-09: qty 250

## 2022-09-09 MED ORDER — OXALIPLATIN CHEMO INJECTION 100 MG/20ML
60.0000 mg/m2 | Freq: Once | INTRAVENOUS | Status: AC
  Administered 2022-09-09: 115 mg via INTRAVENOUS
  Filled 2022-09-09: qty 20

## 2022-09-09 MED ORDER — HYDROCODONE-ACETAMINOPHEN 5-325 MG PO TABS: 1.0000 | ORAL_TABLET | Freq: Four times a day (QID) | ORAL | 0 refills | Status: DC | PRN

## 2022-09-09 MED ORDER — FLUOROURACIL CHEMO INJECTION 2.5 GM/50ML
400.0000 mg/m2 | Freq: Once | INTRAVENOUS | Status: AC
  Administered 2022-09-09: 750 mg via INTRAVENOUS
  Filled 2022-09-09: qty 15

## 2022-09-09 MED ORDER — LEUCOVORIN CALCIUM INJECTION 350 MG
400.0000 mg/m2 | Freq: Once | INTRAVENOUS | Status: AC
  Administered 2022-09-09: 752 mg via INTRAVENOUS
  Filled 2022-09-09: qty 37.6

## 2022-09-09 MED ORDER — SODIUM CHLORIDE 0.9 % IV SOLN
2400.0000 mg/m2 | INTRAVENOUS | Status: DC
  Administered 2022-09-09: 5000 mg via INTRAVENOUS
  Filled 2022-09-09: qty 100

## 2022-09-09 MED ORDER — PALONOSETRON HCL INJECTION 0.25 MG/5ML
0.2500 mg | Freq: Once | INTRAVENOUS | Status: AC
  Administered 2022-09-09: 0.25 mg via INTRAVENOUS
  Filled 2022-09-09: qty 5

## 2022-09-09 MED ORDER — SODIUM CHLORIDE 0.9 % IV SOLN
200.0000 mg | INTRAVENOUS | Status: DC
  Administered 2022-09-09: 200 mg via INTRAVENOUS
  Filled 2022-09-09: qty 10

## 2022-09-09 NOTE — Progress Notes (Signed)
Patient needs rx refill on 800 mg ibuprofen and hydrocodone.

## 2022-09-09 NOTE — Patient Instructions (Signed)
[place]  Discharge Instructions: Thank you for choosing [facility] to provide your oncology and hematology care.  If you have a lab appointment with the [facility], please go directly to the [facility] and check in at the registration area.  Wear comfortable clothing and clothing appropriate for easy access to any Portacath or PICC line.   We strive to give you quality time with your provider. You may need to reschedule your appointment if you arrive late (15 or more minutes).  Arriving late affects you and other patients whose appointments are after yours.  Also, if you miss three or more appointments without notifying the office, you may be dismissed from the clinic at the provider's discretion.      For prescription refill requests, have your pharmacy contact our office and allow 72 hours for refills to be completed.    Today you received the following chemotherapy and/or immunotherapy agents Leucovorin, Oxaliplatin, Adrucil and Venofer.      To help prevent nausea and vomiting after your treatment, we encourage you to take your nausea medication as directed.  BELOW ARE SYMPTOMS THAT SHOULD BE REPORTED IMMEDIATELY: *FEVER GREATER THAN 100.4 F (38 C) OR HIGHER *CHILLS OR SWEATING *NAUSEA AND VOMITING THAT IS NOT CONTROLLED WITH YOUR NAUSEA MEDICATION *UNUSUAL SHORTNESS OF BREATH *UNUSUAL BRUISING OR BLEEDING *URINARY PROBLEMS (pain or burning when urinating, or frequent urination) *BOWEL PROBLEMS (unusual diarrhea, constipation, pain near the anus) TENDERNESS IN MOUTH AND THROAT WITH OR WITHOUT PRESENCE OF ULCERS (sore throat, sores in mouth, or a toothache) UNUSUAL RASH, SWELLING OR PAIN  UNUSUAL VAGINAL DISCHARGE OR ITCHING   Items with * indicate a potential emergency and should be followed up as soon as possible or go to the Emergency Department if any problems should occur.  Please show the CHEMOTHERAPY ALERT CARD or  IMMUNOTHERAPY ALERT CARD at check-in to the Emergency Department and triage nurse.  Should you have questions after your visit or need to cancel or reschedule your appointment, please contact [facility]  [phone] and follow the prompts.  Office hours are 8:00 a.m. to 4:30 p.m. Monday - Friday. Please note that voicemails left after 4:00 p.m. may not be returned until the following business day.  We are closed weekends and major holidays. You have access to a nurse at all times for urgent questions. Please call the main number to the clinic [phone] and follow the prompts.  For any non-urgent questions, you may also contact your provider using MyChart. We now offer e-Visits for anyone 62 and older to request care online for non-urgent symptoms. For details visit [email or web address].   Also download the MyChart app! Go to the app store, search "MyChart", open the app, select [facility], and log in with your MyChart username and password.

## 2022-09-09 NOTE — Progress Notes (Signed)
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 Inez Pilgrim with radiation followed by surgery s/p left sided colostomy and adjuvant chemo likely FOLFOX. Records unavailable.    Cancer of transverse colon ([place])  06/29/2022 Initial Diagnosis   Presented to ED for abdominal pain, nausea and vomiting.    Imaging   CT abdo pelvis IMPRESSION: 1. Patient is status post abdominoperineal resection with left lower quadrant ostomy. The ascending and transverse colon are moderately distended and there is abrupt caliber change at the distal transverse colon/splenic flexure where there is irregular masslike thickening. Findings are suspicious for colonic obstruction due to a mass at this location. There is no dilated small bowel. 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 2. Slightly thick-walled presacral fluid collection with peripheral calcification, may reflect chronic postoperative collection. 3. Gallstones. 4. Aortic atherosclerosis.  CT chest IMPRESSION: 1. No acute intrathoracic process. No evidence of intrathoracic metastases.    Procedure   Colonoscopy on 06/29/22 by Dr. Lysle Pearl A fungating partially obstructing mass in mid transverse colon.   07/03/2022 Pathology Results   DIAGNOSIS: A. COLON, TRANSVERSE; RESECTION: - INVASIVE COLORECTAL ADENOCARCINOMA  WITH DIRECT EXTENSION INTO SMALL INTESTINE. - SEE CANCER SUMMARY BELOW. - TATTOO INK. - SEPARATE 4.5 CM LENGTH OF SMALL INTESTINE.  CANCER CASE SUMMARY: COLON AND RECTUM Standard(s): AJCC-UICC 8  SPECIMEN Procedure: Transverse colectomy  TUMOR Tumor Site: Transverse colon Histologic Type: Adenocarcinoma Histologic Grade: G2, moderately differentiated Tumor Size: Greatest dimension: 6.1 cm Tumor Extent: Directly invades or adheres to adjacent structures (small intestine) Macroscopic Tumor Perforation: Not identified Lymphatic and/or Vascular Invasion: Large vessel (venous), extramural Perineural Invasion: Present Tumor Budding Score: High (10 or more) Treatment Effect: No known presurgical therapy  MARGINS Margin Status for Invasive Carcinoma: All margins negative for invasive carcinoma      Margins examined: Proximal, distal, and mesenteric Margin Status for Non-Invasive Tumor: All margins negative for high-grade dysplasia/intramucosal carcinoma and low-grade dysplasia  REGIONAL LYMPH NODES Regional Lymph Nodes: Regional lymph nodes present      Tumor present in regional lymph node(s)           Number of Lymph Nodes with Tumor: 1      Number of Lymph Nodes Examined: 24  Tumor Deposits: Not identified  DISTANT METASTASES Distant Site(s) Involved, if applicable: Not applicable  PATHOLOGIC STAGE CLASSIFICATION (pTNM, AJCC 8th Edition): Modified Classification: Not applicable [id number]      T suffix: Not applicable [id number] pM not applicable - pM cannot be determined from the submitted specimen(s)    07/03/2022 Procedure   Exploratory laparotomy, en bloc colon resection, reanastomosis, primary repair of parastomal hernia by Dr. Lysle Pearl     07/12/2022 Cancer Staging   Staging form: Colon and Rectum, AJCC 8th Edition - Clinical: Stage IIIC (cT4b, cN1a, cM0) - Signed by [name], MD on 07/12/2022 Stage prefix: Initial diagnosis Total positive nodes: 1 Total nodes  examined: 24   08/12/2022 -  Chemotherapy   Patient is on Treatment Plan : COLORECTAL FOLFOX q14d x 6 months        Interval history-patient is tolerating chemotherapy well without any significant nausea vomiting or diarrhea.  He has baseline neuropathy due to which oxaliplatin dose was decreased.  He does report that his neuropathy is somewhat worse especially when he goes out in the cold weather but otherwise it has remained stable.  Neuropathy in his feet is mildly worse.  ECOG PS- 1 Pain scale- 3 Opioid associated constipation- no  Review of systems- Review of Systems  Constitutional:  Positive for malaise/fatigue.  Neurological:  Positive for sensory change (Peripheral neuropathy).      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[place])    Protein-calorie malnutrition, severe ([place])      Past Surgical History:  Procedure Laterality Date   APPENDECTOMY     COLECTOMY WITH COLOSTOMY CREATION/HARTMANN PROCEDURE N/A 07/03/2022   Procedure: COLECTOMY WITH COLOSTOMY CREATION/HARTMANN PROCEDURE;  Surgeon: Benjamine Sprague, DO;  Location: [facility] ORS;  Service: General;  Laterality: N/A;   COLONOSCOPY N/A 06/29/2022   Procedure: COLONOSCOPY;  Surgeon: Benjamine Sprague, DO;  Location: [facility] ENDOSCOPY;  Service: General;  Laterality: N/A;   PORTACATH PLACEMENT Right 08/08/2022   Procedure: INSERTION PORT-A-CATH;  Surgeon: Benjamine Sprague, DO;  Location: [facility] ORS;  Service: General;  Laterality: Right;    Social History   Socioeconomic History   Marital status: Single    Spouse name: Not on file   Number of children: Not on file   Years of education: Not on file   Highest education level: Not on file  Occupational History   Not on file  Tobacco Use   Smoking status: Never    Smokeless tobacco: Never  Vaping Use   Vaping Use: Never used  Substance and Sexual Activity   Alcohol use: Not Currently   Drug use: Never   Sexual activity: Not on file  Other Topics Concern   Not on file  Social History Narrative   Not on file   Social Determinants of Health   Financial Resource Strain: Not on file  Food Insecurity: Unknown (06/29/2022)   Hunger Vital Sign    Worried About Running Out of Food in the Last Year: Never true    Ran Out of Food in the Last Year: Not on file  Transportation Needs: No Transportation Needs (06/29/2022)   PRAPARE - Hydrologist (Medical): No    Lack of Transportation (Non-Medical): No  Physical Activity: Not on file  Stress: Not on file  Social Connections: Not on file  Intimate Partner Violence: Not At Risk (06/29/2022)   Humiliation, Afraid, Rape, and Kick questionnaire    Fear of Current or Ex-Partner: No    Emotionally Abused: No    Physically Abused: No    Sexually Abused: No    History reviewed. No pertinent family history.   Current Outpatient Medications:    dexamethasone (DECADRON) 4 MG tablet, Take 1 tablet (4 mg total) by mouth daily. Start the day after chemotherapy for 2 days. Take with food., Disp: 30 tablet, Rfl: 1   Ferrous Sulfate (IRON PO), Take 1 tablet by mouth 3 (  three) times a week., Disp: , Rfl:    HYDROcodone-acetaminophen (NORCO) 5-325 MG tablet, Take 1 tablet by mouth every 6 (six) hours as needed for up to 30 doses for moderate pain., Disp: 30 tablet, Rfl: 0   ibuprofen (ADVIL) 800 MG tablet, 800 mg., Disp: , Rfl:    lidocaine-prilocaine (EMLA) cream, Apply to affected area once, Disp: 30 g, Rfl: 3   Multiple Vitamin (MULTIVITAMIN) tablet, Take 1 tablet by mouth daily., Disp: , Rfl:    ondansetron (ZOFRAN) 8 MG tablet, Take 1 tablet (8 mg total) by mouth every 8 (eight) hours as needed for nausea or vomiting. Start on the third day after chemotherapy., Disp: 30 tablet, Rfl:  1   prochlorperazine (COMPAZINE) 10 MG tablet, Take 1 tablet (10 mg total) by mouth every 6 (six) hours as needed for nausea or vomiting., Disp: 30 tablet, Rfl: 1   ibuprofen (ADVIL) 200 MG tablet, Take 400 mg by mouth every 6 (six) hours as needed. (Patient not taking: Reported on 09/09/2022), Disp: , Rfl:   Physical exam:  Vitals:   09/09/22 0843  BP: (!) 156/98  Pulse: 68  Resp: 18  Temp: 97.7 F (36.5 C)  TempSrc: Tympanic  SpO2: 100%  Weight: 173 lb 4.8 oz (78.6 kg)  Height: 5' 8"$  (1.727 m)   Physical Exam Cardiovascular:     Rate and Rhythm: Normal rate and regular rhythm.     Heart sounds: Normal heart sounds.  Pulmonary:     Effort: Pulmonary effort is normal.     Breath sounds: Normal breath sounds.  Skin:    General: Skin is warm and dry.  Neurological:     Mental Status: He is alert and oriented to person, place, and time.         Latest Ref Rng & Units 09/09/2022    8:27 AM  CMP  Glucose 70 - 99 mg/dL 103   BUN 8 - 23 mg/dL 15   Creatinine 0.61 - 1.24 mg/dL 0.86   Sodium 135 - 145 mmol/L 139   Potassium 3.5 - 5.1 mmol/L 3.6   Chloride 98 - 111 mmol/L 110   CO2 22 - 32 mmol/L 23   Calcium 8.9 - 10.3 mg/dL 8.8   Total Protein 6.5 - 8.1 g/dL 6.9   Total Bilirubin 0.3 - 1.2 mg/dL 0.6   Alkaline Phos 38 - 126 U/L 78   AST 15 - 41 U/L 20   ALT 0 - 44 U/L 14       Latest Ref Rng & Units 09/09/2022    8:27 AM  CBC  WBC 4.0 - 10.5 K/uL 4.7   Hemoglobin 13.0 - 17.0 g/dL 10.7   Hematocrit 39.0 - 52.0 % 33.2   Platelets 150 - 400 K/uL 151      Assessment and plan- Patient is a 72 y.o. 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  He is receiving oxaliplatin at a reduced dose of 60 mg/m which I will continue today.  He will see Dr. Doyne Keel in 2 weeks for cycle 4.  Iron  deficiency anemia: Hemoglobin has remained stable around 10.  Iron studies in December 2023 were indicated of iron deficiency and Dr. Havery Moros plan was to give him at least 2 doses of Venofer.  He has not received it so far.  He will proceed with dose 1 of Venofer today and dose to in 2 weeks.  Chemo-induced peripheral neuropathy: Oxaliplatin dose reduced as above.  Continue to monitor.  If symptoms get worse it may have to be discontinued or medications need to be added.  Patient reports chronic right hip pain for which she is on as needed hydrocodone.  I am renewing his prescription today as he has requested for it but does not seem to be related to his colon cancer or chemotherapy.  I have also asked him to cut down on his dose of ibuprofen.  He was taking 800 mg daily.  He should try to at least lowered it to 400 mg if he cannot stop it altogether.  NSAIDs can be associated with increased risk of gastritis especially in the setting of chemotherapy.   Visit Diagnosis 1. Encounter for antineoplastic chemotherapy   2. Cancer of transverse colon ([place])   3. Iron deficiency anemia, unspecified iron deficiency anemia type   4. Peripheral polyneuropathy      Dr. Randa Evens, MD, MPH [facility] at [facility] [id number] 09/09/2022 9:09 AM

## 2022-09-10 DIAGNOSIS — Z933 Colostomy status: Secondary | ICD-10-CM | POA: Diagnosis not present

## 2022-09-10 MED FILL — Iron Sucrose Inj 20 MG/ML (Fe Equiv): INTRAVENOUS | Qty: 10 | Status: AC

## 2022-09-11 ENCOUNTER — Inpatient Hospital Stay: Payer: PPO

## 2022-09-11 VITALS — BP 145/86 | HR 70

## 2022-09-11 DIAGNOSIS — Z5111 Encounter for antineoplastic chemotherapy: Secondary | ICD-10-CM | POA: Diagnosis not present

## 2022-09-11 DIAGNOSIS — C184 Malignant neoplasm of transverse colon: Secondary | ICD-10-CM

## 2022-09-11 MED ORDER — HEPARIN SOD (PORK) LOCK FLUSH 100 UNIT/ML IV SOLN
500.0000 [IU] | Freq: Once | INTRAVENOUS | Status: AC | PRN
  Administered 2022-09-11: 500 [IU]
  Filled 2022-09-11: qty 5

## 2022-09-11 MED ORDER — SODIUM CHLORIDE 0.9% FLUSH
10.0000 mL | INTRAVENOUS | Status: DC | PRN
  Administered 2022-09-11: 10 mL
  Filled 2022-09-11: qty 10

## 2022-09-12 DIAGNOSIS — C189 Malignant neoplasm of colon, unspecified: Secondary | ICD-10-CM | POA: Diagnosis not present

## 2022-09-23 ENCOUNTER — Inpatient Hospital Stay: Payer: PPO

## 2022-09-23 ENCOUNTER — Inpatient Hospital Stay: Payer: PPO | Attending: Internal Medicine | Admitting: Internal Medicine

## 2022-09-23 ENCOUNTER — Encounter: Payer: Self-pay | Admitting: Internal Medicine

## 2022-09-23 VITALS — BP 162/89 | HR 68 | Temp 97.8°F | Resp 20 | Wt 173.8 lb

## 2022-09-23 DIAGNOSIS — C184 Malignant neoplasm of transverse colon: Secondary | ICD-10-CM | POA: Insufficient documentation

## 2022-09-23 DIAGNOSIS — Z5111 Encounter for antineoplastic chemotherapy: Secondary | ICD-10-CM | POA: Insufficient documentation

## 2022-09-23 DIAGNOSIS — D509 Iron deficiency anemia, unspecified: Secondary | ICD-10-CM

## 2022-09-23 DIAGNOSIS — G620 Drug-induced polyneuropathy: Secondary | ICD-10-CM

## 2022-09-23 DIAGNOSIS — C189 Malignant neoplasm of colon, unspecified: Secondary | ICD-10-CM | POA: Diagnosis not present

## 2022-09-23 DIAGNOSIS — D6481 Anemia due to antineoplastic chemotherapy: Secondary | ICD-10-CM

## 2022-09-23 DIAGNOSIS — T451X5A Adverse effect of antineoplastic and immunosuppressive drugs, initial encounter: Secondary | ICD-10-CM | POA: Diagnosis not present

## 2022-09-23 DIAGNOSIS — Z452 Encounter for adjustment and management of vascular access device: Secondary | ICD-10-CM | POA: Diagnosis not present

## 2022-09-23 LAB — COMPREHENSIVE METABOLIC PANEL
ALT: 16 U/L (ref 0–44)
AST: 24 U/L (ref 15–41)
Albumin: 3.7 g/dL (ref 3.5–5.0)
Alkaline Phosphatase: 78 U/L (ref 38–126)
Anion gap: 7 (ref 5–15)
BUN: 19 mg/dL (ref 8–23)
CO2: 24 mmol/L (ref 22–32)
Calcium: 8.9 mg/dL (ref 8.9–10.3)
Chloride: 109 mmol/L (ref 98–111)
Creatinine, Ser: 0.9 mg/dL (ref 0.61–1.24)
GFR, Estimated: >60 mL/min (ref >60)
Glucose, Bld: 119 mg/dL — ABNORMAL HIGH (ref 70–99)
Potassium: 3.7 mmol/L (ref 3.5–5.1)
Sodium: 140 mmol/L (ref 135–145)
Total Bilirubin: 0.3 mg/dL (ref 0.3–1.2)
Total Protein: 6.7 g/dL (ref 6.5–8.1)

## 2022-09-23 LAB — CBC WITH DIFFERENTIAL/PLATELET
Abs Immature Granulocytes: 0.01 10*3/uL (ref 0.00–0.07)
Basophils Absolute: 0.1 10*3/uL (ref 0.0–0.1)
Basophils Relative: 1 %
Eosinophils Absolute: 0.2 10*3/uL (ref 0.0–0.5)
Eosinophils Relative: 3 %
HCT: 34.5 % — ABNORMAL LOW (ref 39.0–52.0)
Hemoglobin: 11.1 g/dL — ABNORMAL LOW (ref 13.0–17.0)
Immature Granulocytes: 0 %
Lymphocytes Relative: 42 %
Lymphs Abs: 2.2 10*3/uL (ref 0.7–4.0)
MCH: 27.5 pg (ref 26.0–34.0)
MCHC: 32.2 g/dL (ref 30.0–36.0)
MCV: 85.4 fL (ref 80.0–100.0)
Monocytes Absolute: 0.6 10*3/uL (ref 0.1–1.0)
Monocytes Relative: 12 %
Neutro Abs: 2.2 10*3/uL (ref 1.7–7.7)
Neutrophils Relative %: 42 %
Platelets: 130 10*3/uL — ABNORMAL LOW (ref 150–400)
RBC: 4.04 MIL/uL — ABNORMAL LOW (ref 4.22–5.81)
RDW: 20.9 % — ABNORMAL HIGH (ref 11.5–15.5)
WBC: 5.3 10*3/uL (ref 4.0–10.5)
nRBC: 0 % (ref 0.0–0.2)

## 2022-09-23 MED ORDER — SODIUM CHLORIDE 0.9 % IV SOLN
200.0000 mg | INTRAVENOUS | Status: DC
  Administered 2022-09-23: 200 mg via INTRAVENOUS
  Filled 2022-09-23: qty 200

## 2022-09-23 MED ORDER — FLUOROURACIL CHEMO INJECTION 2.5 GM/50ML
400.0000 mg/m2 | Freq: Once | INTRAVENOUS | Status: AC
  Administered 2022-09-23: 750 mg via INTRAVENOUS
  Filled 2022-09-23: qty 15

## 2022-09-23 MED ORDER — PALONOSETRON HCL INJECTION 0.25 MG/5ML
0.2500 mg | Freq: Once | INTRAVENOUS | Status: AC
  Administered 2022-09-23: 0.25 mg via INTRAVENOUS
  Filled 2022-09-23: qty 5

## 2022-09-23 MED ORDER — SODIUM CHLORIDE 0.9 % IV SOLN
400.0000 mg/m2 | Freq: Once | INTRAVENOUS | Status: AC
  Administered 2022-09-23: 752 mg via INTRAVENOUS
  Filled 2022-09-23: qty 37.6

## 2022-09-23 MED ORDER — SODIUM CHLORIDE 0.9 % IV SOLN
2400.0000 mg/m2 | INTRAVENOUS | Status: DC
  Administered 2022-09-23: 5000 mg via INTRAVENOUS
  Filled 2022-09-23: qty 100

## 2022-09-23 MED ORDER — SODIUM CHLORIDE 0.9 % IV SOLN
Freq: Once | INTRAVENOUS | Status: AC
  Filled 2022-09-23: qty 250

## 2022-09-23 MED ORDER — SODIUM CHLORIDE 0.9 % IV SOLN
10.0000 mg | Freq: Once | INTRAVENOUS | Status: AC
  Administered 2022-09-23: 10 mg via INTRAVENOUS
  Filled 2022-09-23: qty 10

## 2022-09-23 NOTE — Progress Notes (Signed)
[place] NOTE  Patient Care Team: Juluis Pitch, MD as PCP - General (Family Medicine) Clent Jacks, RN as Oncology Nurse Navigator   CANCER STAGING   Cancer Staging  Cancer of transverse colon [facility]) Staging form: Colon and Rectum, AJCC 8th Edition - Clinical: Stage IIIC (cT4b, cN1a, cM0) - Signed by [name], MD on 07/12/2022 Stage prefix: Initial diagnosis Total positive nodes: 1 Total nodes examined: 24   ASSESSMENT & PLAN:  [name] 72 y.o. male with pmh of colon cancer s/p left sided colostomy in 2011, chemotherapy, neuropathy follows with Medical Oncology for Stage IIIC colon cancer.   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 S/p resection and reanastomosis with Dr. Lysle Pearl on 12/13. CT imaging negative for metastasis.  Plan for total 6 months of treatment.  -Patient seen today prior to cycle 4 of FOLFOX. On Oxalipation 60 mg/m2 due to baseline neuropathy.  Labs reviewed.  Hemoglobin 11.1 and platelets of 130.  CMP unremarkable.  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  Will hold off on oxaliplatin today.  Proceed with 5-FU only with pump disconnect on day 3.  -Patient has antiemetics-Zofran and Compazine as needed.  Using dexamethasone 4 mg for 2 days after chemotherapy.  # Arthritis -Patient has multiple arthritic joints including shoulders or hips.  Has been getting steroid injections.  -Decreased his ibuprofen to 400 mg daily.  On Norco.  #Microcytic anemia -Iron panel consistent with iron deficiency. -Continue with iron pills.  IV Venofer today.  #History of colon cancer s/p left sided colostomy, chemo in 2011 -Surgery and had adjuvant chemotherapy.  No records are available.  # Access -port placed on 08/08/2022 by Dr. Lysle Pearl  Orders Placed This Encounter  Procedures   CBC with Differential     Standing Status:   Future    Standing Expiration Date:   10/07/2023   Comprehensive metabolic panel    Standing Status:   Future    Standing Expiration Date:   10/07/2023   RTC in 12 weeks for MD visit, labs, cycle 5 FOLFOX  The total time spent in the appointment was 30 minutes encounter with patients including review of chart and various tests results, discussions about plan of care and coordination of care plan   All questions were answered. The patient knows to call the clinic with any problems, questions or concerns. No barriers to learning was detected.  [name], MD 3/4/20249:57 AM   HISTORY OF PRESENTING ILLNESS:  [name] 72 y.o. male with pmh of colon cancer s/p left sided colostomy in 2011, chemotherapy, neuropathy follows with Medical Oncology for Stage IIIC colon cancer.   Interval history Patient in today accompanied with wife prior to cycle 4 of FOLFOX. Patient reports worsening of his neuropathy predominantly from cold exposure.  He experiences severe tingling and numbness on washing his hand under cold water or even just touching the doorknob.  Except for cold he has mild tingling but is bearable.  Reports some nausea on day 3 and 4 which is manageable with steroid and as needed Zofran and Compazine.  Denies any vomiting.  Appetite is okay.  No diarrhea or abdominal pain.  I have reviewed his chart and materials related to his cancer extensively and collaborated history with the patient. Summary of oncologic history is as follows: Oncology History Overview Note  Patient has a history of colon cancer in 2011 treated with  Dr. Inez Pilgrim with radiation followed by surgery s/p left sided colostomy and adjuvant chemo likely FOLFOX. Records unavailable.    Cancer of transverse colon ([name])  06/29/2022 Initial Diagnosis   Presented to ED for abdominal pain, nausea and vomiting.    Imaging   CT abdo pelvis IMPRESSION: 1. Patient is status post abdominoperineal  resection with left lower quadrant ostomy. The ascending and transverse colon are moderately distended and there is abrupt caliber change at the distal transverse colon/splenic flexure where there is irregular masslike thickening. Findings are suspicious for colonic obstruction due to a mass at this location. There is no dilated small bowel. 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 2. Slightly thick-walled presacral fluid collection with peripheral calcification, may reflect chronic postoperative collection. 3. Gallstones. 4. Aortic atherosclerosis.  CT chest IMPRESSION: 1. No acute intrathoracic process. No evidence of intrathoracic metastases.    Procedure   Colonoscopy on 06/29/22 by Dr. Lysle Pearl A fungating partially obstructing mass in mid transverse colon.   07/03/2022 Pathology Results   DIAGNOSIS: A. 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 - SEE CANCER SUMMARY BELOW. - TATTOO INK. - SEPARATE 4.5 CM LENGTH OF SMALL INTESTINE.  CANCER CASE SUMMARY: COLON AND RECTUM Standard(s): AJCC-UICC 8  SPECIMEN Procedure: Transverse colectomy  TUMOR Tumor Site: Transverse colon Histologic Type: Adenocarcinoma Histologic Grade: G2, moderately differentiated Tumor Size: Greatest dimension: 6.1 cm Tumor Extent: Directly invades or adheres to adjacent structures (small intestine) Macroscopic Tumor Perforation: Not identified Lymphatic and/or Vascular Invasion: Large vessel (venous), extramural Perineural Invasion: Present Tumor Budding Score: High (10 or more) Treatment Effect: No known presurgical therapy  MARGINS Margin Status for Invasive Carcinoma: All margins negative for invasive carcinoma      Margins examined: Proximal, distal, and mesenteric Margin Status for Non-Invasive Tumor: All margins negative for high-grade  dysplasia/intramucosal carcinoma and low-grade dysplasia  REGIONAL LYMPH NODES Regional Lymph Nodes: Regional lymph nodes present      Tumor present in regional lymph node(s)           Number of Lymph Nodes with Tumor: 1      Number of Lymph Nodes Examined: 24  Tumor Deposits: Not identified  DISTANT METASTASES Distant Site(s) Involved, if applicable: Not applicable  PATHOLOGIC STAGE CLASSIFICATION (pTNM, AJCC 8th Edition): Modified Classification: Not applicable [id number]      T suffix: Not applicable [id number] pM not applicable - pM cannot be determined from the submitted specimen(s)    07/03/2022 Procedure   Exploratory laparotomy, en bloc colon resection, reanastomosis, primary repair of parastomal hernia by Dr. Lysle Pearl     07/12/2022 Cancer Staging   Staging form: Colon and Rectum, AJCC 8th Edition - Clinical: Stage IIIC (cT4b, cN1a, cM0) - Signed by [name], MD on 07/12/2022 Stage prefix: Initial diagnosis Total positive nodes: 1 Total nodes examined: 24   08/12/2022 -  Chemotherapy   Patient is on Treatment Plan : COLORECTAL FOLFOX q14d x 6 months       MEDICAL HISTORY:  Past Medical History:  Diagnosis Date   Colon cancer ([place])    remission 2011   Heart murmur    as a child from rheumatic fever   Malignant neoplasm of transverse colon ([place]) 2024   Microcytic anemia    Neuropathy    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[place])    Protein-calorie malnutrition, severe ([place])     SURGICAL HISTORY: Past Surgical History:  Procedure Laterality Date  APPENDECTOMY     COLECTOMY WITH COLOSTOMY CREATION/HARTMANN PROCEDURE N/A 07/03/2022   Procedure: COLECTOMY WITH COLOSTOMY CREATION/HARTMANN PROCEDURE;  Surgeon: Benjamine Sprague, DO;  Location: [facility] ORS;  Service: General;  Laterality: N/A;   COLONOSCOPY N/A 06/29/2022   Procedure: COLONOSCOPY;  Surgeon: Benjamine Sprague, DO;  Location: [facility] ENDOSCOPY;  Service: General;  Laterality: N/A;   PORTACATH PLACEMENT Right 08/08/2022    Procedure: INSERTION PORT-A-CATH;  Surgeon: Benjamine Sprague, DO;  Location: [facility] ORS;  Service: General;  Laterality: Right;    SOCIAL HISTORY: Social History   Socioeconomic History   Marital status: Single    Spouse name: Not on file   Number of children: Not on file   Years of education: Not on file   Highest education level: Not on file  Occupational History   Not on file  Tobacco Use   Smoking status: Never   Smokeless tobacco: Never  Vaping Use   Vaping Use: Never used  Substance and Sexual Activity   Alcohol use: Not Currently   Drug use: Never   Sexual activity: Not on file  Other Topics Concern   Not on file  Social History Narrative   Not on file   Social Determinants of Health   Financial Resource Strain: Not on file  Food Insecurity: Unknown (06/29/2022)   Hunger Vital Sign    Worried About Running Out of Food in the Last Year: Never true    Ran Out of Food in the Last Year: Not on file  Transportation Needs: No Transportation Needs (06/29/2022)   PRAPARE - Hydrologist (Medical): No    Lack of Transportation (Non-Medical): No  Physical Activity: Not on file  Stress: Not on file  Social Connections: Not on file  Intimate Partner Violence: Not At Risk (06/29/2022)   Humiliation, Afraid, Rape, and Kick questionnaire    Fear of Current or Ex-Partner: No    Emotionally Abused: No    Physically Abused: No    Sexually Abused: No    FAMILY HISTORY: History reviewed. No pertinent family history.  ALLERGIES:  has No Known Allergies.  MEDICATIONS:  Current Outpatient Medications  Medication Sig Dispense Refill   dexamethasone (DECADRON) 4 MG tablet Take 1 tablet (4 mg total) by mouth daily. Start the day after chemotherapy for 2 days. Take with food. 30 tablet 1   Ferrous Sulfate (IRON PO) Take 1 tablet by mouth 3 (three) times a week.     HYDROcodone-acetaminophen (NORCO) 5-325 MG tablet Take 1 tablet by mouth every 6 (six) hours as  needed for up to 30 doses for moderate pain. 30 tablet 0   ibuprofen (ADVIL) 800 MG tablet 800 mg.     lidocaine-prilocaine (EMLA) cream Apply to affected area once 30 g 3   Multiple Vitamin (MULTIVITAMIN) tablet Take 1 tablet by mouth daily.     ondansetron (ZOFRAN) 8 MG tablet Take 1 tablet (8 mg total) by mouth every 8 (eight) hours as needed for nausea or vomiting. Start on the third day after chemotherapy. 30 tablet 1   prochlorperazine (COMPAZINE) 10 MG tablet Take 1 tablet (10 mg total) by mouth every 6 (six) hours as needed for nausea or vomiting. 30 tablet 1   ibuprofen (ADVIL) 200 MG tablet Take 400 mg by mouth every 6 (six) hours as needed. (Patient not taking: Reported on 09/09/2022)     No current facility-administered medications for this visit.    REVIEW OF SYSTEMS:   Pertinent information mentioned  in HPI All other systems were reviewed with the patient and are negative.  PHYSICAL EXAMINATION: ECOG PERFORMANCE STATUS: 1 - Symptomatic but completely ambulatory  Vitals:   09/23/22 0918  BP: (!) 162/89  Pulse: 68  Resp: 20  Temp: 97.8 F (36.6 C)  SpO2: 100%   Filed Weights   09/23/22 0918  Weight: 173 lb 12.8 oz (78.8 kg)    GENERAL:alert, no distress and comfortable SKIN: skin color, texture, turgor are normal, no rashes or significant lesions EYES: normal, conjunctiva are pink and non-injected, sclera clear OROPHARYNX:no exudate, no erythema and lips, buccal mucosa, and tongue normal  NECK: supple, thyroid normal size, non-tender, without nodularity LYMPH:  no palpable lymphadenopathy in the cervical, axillary or inguinal LUNGS: clear to auscultation and percussion with normal breathing effort HEART: regular rate & rhythm and no murmurs and no lower extremity edema ABDOMEN:abdomen soft, non-tender and normal bowel sounds Musculoskeletal:no cyanosis of digits and no clubbing  PSYCH: alert & oriented x 3 with fluent speech NEURO: no focal motor/sensory  deficits  LABORATORY DATA:  I have reviewed the data as listed Lab Results  Component Value Date   WBC 5.3 09/23/2022   HGB 11.1 (L) 09/23/2022   HCT 34.5 (L) 09/23/2022   MCV 85.4 09/23/2022   PLT 130 (L) 09/23/2022   Recent Labs    08/26/22 0812 09/09/22 0827 09/23/22 0857  NA 139 139 140  K 3.8 3.6 3.7  CL 110 110 109  CO2 '23 23 24  '$ GLUCOSE 95 103* 119*  BUN '19 15 19  '$ CREATININE 0.83 0.86 0.90  CALCIUM 8.5* 8.8* 8.9  GFRNONAA >60 >60 >60  PROT 6.3* 6.9 6.7  ALBUMIN 3.5 3.7 3.7  AST '21 20 24  '$ ALT '14 14 16  '$ ALKPHOS 81 78 78  BILITOT 0.3 0.6 0.3    RADIOGRAPHIC STUDIES: I have personally reviewed the radiological images as listed and agreed with the findings in the report. No results found.

## 2022-09-23 NOTE — Patient Instructions (Signed)
[facility]  Discharge Instructions: Thank you for choosing [facility] to provide your oncology and hematology care.  If you have a lab appointment with the Cancer Center, please go directly to the Cancer Center and check in at the registration area.  Wear comfortable clothing and clothing appropriate for easy access to any Portacath or PICC line.   We strive to give you quality time with your provider. You may need to reschedule your appointment if you arrive late (15 or more minutes).  Arriving late affects you and other patients whose appointments are after yours.  Also, if you miss three or more appointments without notifying the office, you may be dismissed from the clinic at the provider's discretion.      For prescription refill requests, have your pharmacy contact our office and allow 72 hours for refills to be completed.     To help prevent nausea and vomiting after your treatment, we encourage you to take your nausea medication as directed.  BELOW ARE SYMPTOMS THAT SHOULD BE REPORTED IMMEDIATELY: *FEVER GREATER THAN 100.4 F (38 C) OR HIGHER *CHILLS OR SWEATING *NAUSEA AND VOMITING THAT IS NOT CONTROLLED WITH YOUR NAUSEA MEDICATION *UNUSUAL SHORTNESS OF BREATH *UNUSUAL BRUISING OR BLEEDING *URINARY PROBLEMS (pain or burning when urinating, or frequent urination) *BOWEL PROBLEMS (unusual diarrhea, constipation, pain near the anus) TENDERNESS IN MOUTH AND THROAT WITH OR WITHOUT PRESENCE OF ULCERS (sore throat, sores in mouth, or a toothache) UNUSUAL RASH, SWELLING OR PAIN  UNUSUAL VAGINAL DISCHARGE OR ITCHING   Items with * indicate a potential emergency and should be followed up as soon as possible or go to the Emergency Department if any problems should occur.  Please show the CHEMOTHERAPY ALERT CARD or IMMUNOTHERAPY ALERT CARD at check-in to the Emergency Department and triage nurse.  Should you have questions after your visit  or need to cancel or reschedule your appointment, please contact [facility]  [phone] and follow the prompts.  Office hours are 8:00 a.m. to 4:30 p.m. Monday - Friday. Please note that voicemails left after 4:00 p.m. may not be returned until the following business day.  We are closed weekends and major holidays. You have access to a nurse at all times for urgent questions. Please call the main number to the clinic [phone] and follow the prompts.  For any non-urgent questions, you may also contact your provider using MyChart. We now offer e-Visits for anyone 18 and older to request care online for non-urgent symptoms. For details visit [email or web address].   Also download the MyChart app! Go to the app store, search "MyChart", open the app, select [facility], and log in with your MyChart username and password.    

## 2022-09-23 NOTE — Progress Notes (Signed)
Patient states the neuropathy has gotten to where he is having trouble washing his hands in cold water.

## 2022-09-24 MED FILL — Iron Sucrose Inj 20 MG/ML (Fe Equiv): INTRAVENOUS | Qty: 10 | Status: AC

## 2022-09-25 ENCOUNTER — Other Ambulatory Visit: Payer: Self-pay

## 2022-09-25 ENCOUNTER — Inpatient Hospital Stay: Payer: PPO

## 2022-09-25 VITALS — BP 150/85 | HR 62 | Resp 20

## 2022-09-25 DIAGNOSIS — Z5111 Encounter for antineoplastic chemotherapy: Secondary | ICD-10-CM | POA: Diagnosis not present

## 2022-09-25 DIAGNOSIS — C184 Malignant neoplasm of transverse colon: Secondary | ICD-10-CM

## 2022-09-25 MED ORDER — SODIUM CHLORIDE 0.9% FLUSH
10.0000 mL | INTRAVENOUS | Status: DC | PRN
  Administered 2022-09-25: 10 mL
  Filled 2022-09-25: qty 10

## 2022-09-25 MED ORDER — HEPARIN SOD (PORK) LOCK FLUSH 100 UNIT/ML IV SOLN
500.0000 [IU] | Freq: Once | INTRAVENOUS | Status: AC | PRN
  Administered 2022-09-25: 500 [IU]
  Filled 2022-09-25: qty 5

## 2022-10-04 MED FILL — Dexamethasone Sodium Phosphate Inj 100 MG/10ML: INTRAMUSCULAR | Qty: 1 | Status: AC

## 2022-10-07 ENCOUNTER — Inpatient Hospital Stay (HOSPITAL_BASED_OUTPATIENT_CLINIC_OR_DEPARTMENT_OTHER): Payer: PPO | Admitting: Internal Medicine

## 2022-10-07 ENCOUNTER — Inpatient Hospital Stay: Payer: PPO

## 2022-10-07 VITALS — BP 149/91 | HR 82 | Temp 96.5°F | Resp 15 | Wt 171.3 lb

## 2022-10-07 DIAGNOSIS — D509 Iron deficiency anemia, unspecified: Secondary | ICD-10-CM | POA: Diagnosis not present

## 2022-10-07 DIAGNOSIS — C184 Malignant neoplasm of transverse colon: Secondary | ICD-10-CM

## 2022-10-07 DIAGNOSIS — Z5111 Encounter for antineoplastic chemotherapy: Secondary | ICD-10-CM | POA: Diagnosis not present

## 2022-10-07 DIAGNOSIS — F5101 Primary insomnia: Secondary | ICD-10-CM | POA: Diagnosis not present

## 2022-10-07 DIAGNOSIS — T451X5A Adverse effect of antineoplastic and immunosuppressive drugs, initial encounter: Secondary | ICD-10-CM

## 2022-10-07 DIAGNOSIS — G4700 Insomnia, unspecified: Secondary | ICD-10-CM | POA: Insufficient documentation

## 2022-10-07 DIAGNOSIS — G620 Drug-induced polyneuropathy: Secondary | ICD-10-CM

## 2022-10-07 DIAGNOSIS — C189 Malignant neoplasm of colon, unspecified: Secondary | ICD-10-CM | POA: Diagnosis not present

## 2022-10-07 LAB — CBC WITH DIFFERENTIAL/PLATELET
Abs Immature Granulocytes: 0.03 10*3/uL (ref 0.00–0.07)
Basophils Absolute: 0.1 10*3/uL (ref 0.0–0.1)
Basophils Relative: 1 %
Eosinophils Absolute: 0.1 10*3/uL (ref 0.0–0.5)
Eosinophils Relative: 1 %
HCT: 38.7 % — ABNORMAL LOW (ref 39.0–52.0)
Hemoglobin: 12.8 g/dL — ABNORMAL LOW (ref 13.0–17.0)
Immature Granulocytes: 0 %
Lymphocytes Relative: 24 %
Lymphs Abs: 2.3 10*3/uL (ref 0.7–4.0)
MCH: 29 pg (ref 26.0–34.0)
MCHC: 33.1 g/dL (ref 30.0–36.0)
MCV: 87.8 fL (ref 80.0–100.0)
Monocytes Absolute: 0.7 10*3/uL (ref 0.1–1.0)
Monocytes Relative: 7 %
Neutro Abs: 6.2 10*3/uL (ref 1.7–7.7)
Neutrophils Relative %: 67 %
Platelets: 155 10*3/uL (ref 150–400)
RBC: 4.41 MIL/uL (ref 4.22–5.81)
RDW: 23.3 % — ABNORMAL HIGH (ref 11.5–15.5)
WBC: 9.5 10*3/uL (ref 4.0–10.5)
nRBC: 0 % (ref 0.0–0.2)

## 2022-10-07 LAB — COMPREHENSIVE METABOLIC PANEL
ALT: 24 U/L (ref 0–44)
AST: 35 U/L (ref 15–41)
Albumin: 4.1 g/dL (ref 3.5–5.0)
Alkaline Phosphatase: 69 U/L (ref 38–126)
Anion gap: 10 (ref 5–15)
BUN: 17 mg/dL (ref 8–23)
CO2: 23 mmol/L (ref 22–32)
Calcium: 9.2 mg/dL (ref 8.9–10.3)
Chloride: 106 mmol/L (ref 98–111)
Creatinine, Ser: 1.04 mg/dL (ref 0.61–1.24)
GFR, Estimated: >60 mL/min (ref >60)
Glucose, Bld: 139 mg/dL — ABNORMAL HIGH (ref 70–99)
Potassium: 3.7 mmol/L (ref 3.5–5.1)
Sodium: 139 mmol/L (ref 135–145)
Total Bilirubin: 0.8 mg/dL (ref 0.3–1.2)
Total Protein: 7.2 g/dL (ref 6.5–8.1)

## 2022-10-07 MED ORDER — SODIUM CHLORIDE 0.9 % IV SOLN
10.0000 mg | Freq: Once | INTRAVENOUS | Status: AC
  Administered 2022-10-07: 10 mg via INTRAVENOUS
  Filled 2022-10-07: qty 10

## 2022-10-07 MED ORDER — FLUOROURACIL CHEMO INJECTION 2.5 GM/50ML
400.0000 mg/m2 | Freq: Once | INTRAVENOUS | Status: AC
  Administered 2022-10-07: 750 mg via INTRAVENOUS
  Filled 2022-10-07: qty 15

## 2022-10-07 MED ORDER — LEUCOVORIN CALCIUM INJECTION 350 MG
400.0000 mg/m2 | Freq: Once | INTRAVENOUS | Status: AC
  Administered 2022-10-07: 752 mg via INTRAVENOUS
  Filled 2022-10-07: qty 37.6

## 2022-10-07 MED ORDER — SODIUM CHLORIDE 0.9 % IV SOLN
INTRAVENOUS | Status: DC | PRN
  Filled 2022-10-07: qty 250

## 2022-10-07 MED ORDER — SODIUM CHLORIDE 0.9 % IV SOLN
2400.0000 mg/m2 | INTRAVENOUS | Status: DC
  Administered 2022-10-07: 5000 mg via INTRAVENOUS
  Filled 2022-10-07: qty 100

## 2022-10-07 MED ORDER — MELATONIN 5 MG PO TABS: 5.0000 mg | ORAL_TABLET | Freq: Every evening | ORAL | 0 refills | Status: AC | PRN

## 2022-10-07 MED ORDER — PALONOSETRON HCL INJECTION 0.25 MG/5ML
0.2500 mg | Freq: Once | INTRAVENOUS | Status: AC
  Administered 2022-10-07: 0.25 mg via INTRAVENOUS
  Filled 2022-10-07: qty 5

## 2022-10-07 NOTE — Progress Notes (Signed)
[facility] NOTE  Patient Care Team: Juluis Pitch, MD as PCP - General (Family Medicine) Clent Jacks, RN as Oncology Nurse Navigator   CANCER STAGING   Cancer Staging  Cancer of transverse colon [facility]) Staging form: Colon and Rectum, AJCC 8th Edition - Clinical: Stage IIIC (cT4b, cN1a, cM0) - Signed by [name], MD on 07/12/2022 Stage prefix: Initial diagnosis Total positive nodes: 1 Total nodes examined: 24   ASSESSMENT & PLAN:  [name] 72 y.o. male with pmh of colon cancer s/p left sided colostomy in 2011, chemotherapy, neuropathy follows with Medical Oncology for Stage IIIC colon cancer.   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 S/p resection and reanastomosis with Dr. Lysle Pearl on 12/13. CT imaging negative for metastasis.  Plan for total 6 months of treatment.  -Oxaliplatin held with cycle 4 due to worsening cold induced neuropathy.  Symptoms improved today.  I will continue to hold oxaliplatin with this cycle. If his neuropathy is stable will reintroduce oxaliplatin with cycle #6.  Labs reviewed and acceptable to proceed with 5-FU.  Pump disconnect on day 3.  -Patient has antiemetics-Zofran and Compazine as needed.  Using dexamethasone 4 mg for 2 days after chemotherapy.  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  Started on oxaliplatin 60 mg/m.  After cycle 3 had worsening of neuropathy especially with cold exposure. -Oxaliplatin now on hold.  Neuropathy has improved.  Plan to reintroduce with next cycle.  # Insomnia -Trial of melatonin 5 mg nightly as needed.  # Arthritis -Patient has multiple arthritic joints including shoulders or hips.  Has been getting steroid injections.  -Decreased his ibuprofen to 400 mg daily.  On Norco.  # Iron deficiency anemia - s/p IV Venofer x 2 doses.  Continue with iron  pills.  #History of colon cancer s/p left sided colostomy, chemo in 2011 -Surgery and had adjuvant chemotherapy.  No records are available.  # Access -port placed on 08/08/2022 by Dr. Lysle Pearl  Orders Placed This Encounter  Procedures   CBC with Differential    Standing Status:   Future    Standing Expiration Date:   10/21/2023   Comprehensive metabolic panel    Standing Status:   Future    Standing Expiration Date:   10/21/2023   RTC in 2 weeks for MD visit, labs, cycle 6 FOLFOX.  Pump disconnect on day 3.  The total time spent in the appointment was 30 minutes encounter with patients including review of chart and various tests results, discussions about plan of care and coordination of care plan   All questions were answered. The patient knows to call the clinic with any problems, questions or concerns. No barriers to learning was detected.  [name], MD 3/18/20249:44 AM   HISTORY OF PRESENTING ILLNESS:  [name] 72 y.o. male with pmh of colon cancer s/p left sided colostomy in 2011, chemotherapy, neuropathy follows with Medical Oncology for Stage IIIC colon cancer.   Interval history Patient in today accompanied with wife prior to cycle 5 of FOLFOX.  Patient reported worsening of the neuropathy predominantly with the cold exposure.  Oxaliplatin was held with cycle 4.  He reports improvement in neuropathy.  He now has constant tingling and numbness in his fingertips which is baseline for him but manageable even with cold exposure.  He reports some nausea with 5-FU.  Manageable with dexamethasone and Zofran.  Having difficulty sleeping  since initiation of chemotherapy.  Reports diffuse muscle soreness since.  Right hip pain has improved.  So he did not reach out to orthopedics for further evaluation.  I have reviewed his chart and materials related to his cancer extensively and collaborated history with the patient. Summary of oncologic history is as follows: Oncology  History Overview Note  Patient has a history of colon cancer in 2011 treated with Dr. Inez Pilgrim with radiation followed by surgery s/p left sided colostomy and adjuvant chemo likely FOLFOX. Records unavailable.    Cancer of transverse colon ([place])  06/29/2022 Initial Diagnosis   Presented to ED for abdominal pain, nausea and vomiting.    Imaging   CT abdo pelvis IMPRESSION: 1. Patient is status post abdominoperineal resection with left lower quadrant ostomy. The ascending and transverse colon are moderately distended and there is abrupt caliber change at the distal transverse colon/splenic flexure where there is irregular masslike thickening. Findings are suspicious for colonic obstruction due to a mass at this location. There is no dilated small bowel. 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 2. Slightly thick-walled presacral fluid collection with peripheral calcification, may reflect chronic postoperative collection. 3. Gallstones. 4. Aortic atherosclerosis.  CT chest IMPRESSION: 1. No acute intrathoracic process. No evidence of intrathoracic metastases.    Procedure   Colonoscopy on 06/29/22 by Dr. Lysle Pearl A fungating partially obstructing mass in mid transverse colon.   07/03/2022 Pathology Results   DIAGNOSIS: A. 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 - SEE CANCER SUMMARY BELOW. - TATTOO INK. - SEPARATE 4.5 CM LENGTH OF SMALL INTESTINE.  CANCER CASE SUMMARY: COLON AND RECTUM Standard(s): AJCC-UICC 8  SPECIMEN Procedure: Transverse colectomy  TUMOR Tumor Site: Transverse colon Histologic Type: Adenocarcinoma Histologic Grade: G2, moderately differentiated Tumor Size: Greatest dimension: 6.1 cm Tumor Extent: Directly invades or adheres to adjacent structures (small intestine) Macroscopic Tumor Perforation: Not  identified Lymphatic and/or Vascular Invasion: Large vessel (venous), extramural Perineural Invasion: Present Tumor Budding Score: High (10 or more) Treatment Effect: No known presurgical therapy  MARGINS Margin Status for Invasive Carcinoma: All margins negative for invasive carcinoma      Margins examined: Proximal, distal, and mesenteric Margin Status for Non-Invasive Tumor: All margins negative for high-grade dysplasia/intramucosal carcinoma and low-grade dysplasia  REGIONAL LYMPH NODES Regional Lymph Nodes: Regional lymph nodes present      Tumor present in regional lymph node(s)           Number of Lymph Nodes with Tumor: 1      Number of Lymph Nodes Examined: 24  Tumor Deposits: Not identified  DISTANT METASTASES Distant Site(s) Involved, if applicable: Not applicable  PATHOLOGIC STAGE CLASSIFICATION (pTNM, AJCC 8th Edition): Modified Classification: Not applicable [id number]      T suffix: Not applicable [id number] pM not applicable - pM cannot be determined from the submitted specimen(s)    07/03/2022 Procedure   Exploratory laparotomy, en bloc colon resection, reanastomosis, primary repair of parastomal hernia by Dr. Lysle Pearl     07/12/2022 Cancer Staging   Staging form: Colon and Rectum, AJCC 8th Edition - Clinical: Stage IIIC (cT4b, cN1a, cM0) - Signed by [name], MD on 07/12/2022 Stage prefix: Initial diagnosis Total positive nodes: 1 Total nodes examined: 24   08/12/2022 -  Chemotherapy   Patient is on Treatment Plan : COLORECTAL FOLFOX q14d x 6 months       MEDICAL HISTORY:  Past Medical History:  Diagnosis Date   Colon cancer ([place])    remission 2011   Heart murmur    as a child from rheumatic fever   Malignant neoplasm of transverse colon ([place]) 2024   Microcytic anemia    Neuropathy    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[facility])    Protein-calorie malnutrition, severe ([place])     SURGICAL HISTORY: Past Surgical History:  Procedure Laterality Date   APPENDECTOMY      COLECTOMY WITH COLOSTOMY CREATION/HARTMANN PROCEDURE N/A 07/03/2022   Procedure: COLECTOMY WITH COLOSTOMY CREATION/HARTMANN PROCEDURE;  Surgeon: Benjamine Sprague, DO;  Location: [facility] ORS;  Service: General;  Laterality: N/A;   COLONOSCOPY N/A 06/29/2022   Procedure: COLONOSCOPY;  Surgeon: Benjamine Sprague, DO;  Location: [facility] ENDOSCOPY;  Service: General;  Laterality: N/A;   PORTACATH PLACEMENT Right 08/08/2022   Procedure: INSERTION PORT-A-CATH;  Surgeon: Benjamine Sprague, DO;  Location: [facility] ORS;  Service: General;  Laterality: Right;    SOCIAL HISTORY: Social History   Socioeconomic History   Marital status: Single    Spouse name: Not on file   Number of children: Not on file   Years of education: Not on file   Highest education level: Not on file  Occupational History   Not on file  Tobacco Use   Smoking status: Never   Smokeless tobacco: Never  Vaping Use   Vaping Use: Never used  Substance and Sexual Activity   Alcohol use: Not Currently   Drug use: Never   Sexual activity: Not on file  Other Topics Concern   Not on file  Social History Narrative   Not on file   Social Determinants of Health   Financial Resource Strain: Not on file  Food Insecurity: Unknown (06/29/2022)   Hunger Vital Sign    Worried About Running Out of Food in the Last Year: Never true    Ran Out of Food in the Last Year: Not on file  Transportation Needs: No Transportation Needs (06/29/2022)   PRAPARE - Hydrologist (Medical): No    Lack of Transportation (Non-Medical): No  Physical Activity: Not on file  Stress: Not on file  Social Connections: Not on file  Intimate Partner Violence: Not At Risk (06/29/2022)   Humiliation, Afraid, Rape, and Kick questionnaire    Fear of Current or Ex-Partner: No    Emotionally Abused: No    Physically Abused: No    Sexually Abused: No    FAMILY HISTORY: No family history on file.  ALLERGIES:  has No Known Allergies.  MEDICATIONS:   Current Outpatient Medications  Medication Sig Dispense Refill   dexamethasone (DECADRON) 4 MG tablet Take 1 tablet (4 mg total) by mouth daily. Start the day after chemotherapy for 2 days. Take with food. 30 tablet 1   Ferrous Sulfate (IRON PO) Take 1 tablet by mouth 3 (three) times a week.     HYDROcodone-acetaminophen (NORCO) 5-325 MG tablet Take 1 tablet by mouth every 6 (six) hours as needed for up to 30 doses for moderate pain. 30 tablet 0   lidocaine-prilocaine (EMLA) cream Apply to affected area once 30 g 3   melatonin 5 MG TABS Take 1 tablet (5 mg total) by mouth at bedtime as needed. 30 tablet 0   Multiple Vitamin (MULTIVITAMIN) tablet Take 1 tablet by mouth daily.     ondansetron (ZOFRAN) 8 MG tablet Take 1 tablet (8 mg total) by mouth every 8 (eight) hours as needed for nausea or vomiting. Start on the third  day after chemotherapy. 30 tablet 1   prochlorperazine (COMPAZINE) 10 MG tablet Take 1 tablet (10 mg total) by mouth every 6 (six) hours as needed for nausea or vomiting. 30 tablet 1   ibuprofen (ADVIL) 200 MG tablet Take 400 mg by mouth every 6 (six) hours as needed. (Patient not taking: Reported on 09/09/2022)     ibuprofen (ADVIL) 800 MG tablet 800 mg. (Patient not taking: Reported on 10/07/2022)     No current facility-administered medications for this visit.   Facility-Administered Medications Ordered in Other Visits  Medication Dose Route Frequency Provider Last Rate Last Admin   0.9 %  sodium chloride infusion   Intravenous PRN [name], MD 10 mL/hr at 10/07/22 0941 New Bag at 10/07/22 0941   dexamethasone (DECADRON) 10 mg in sodium chloride 0.9 % 50 mL IVPB  10 mg Intravenous Once [name], MD 204 mL/hr at 10/07/22 0942 10 mg at 10/07/22 0942   fluorouracil (ADRUCIL) 5,000 mg in sodium chloride 0.9 % 150 mL chemo infusion  2,400 mg/m2 (Treatment Plan Recorded) Intravenous 1 day or 1 dose [name], MD       fluorouracil (ADRUCIL) chemo injection 750 mg   400 mg/m2 (Treatment Plan Recorded) Intravenous Once [name], MD       leucovorin 752 mg in dextrose 5 % 250 mL infusion  400 mg/m2 (Treatment Plan Recorded) Intravenous Once [name], MD       palonosetron (ALOXI) injection 0.25 mg  0.25 mg Intravenous Once [name], MD        REVIEW OF SYSTEMS:   Pertinent information mentioned in HPI All other systems were reviewed with the patient and are negative.  PHYSICAL EXAMINATION: ECOG PERFORMANCE STATUS: 1 - Symptomatic but completely ambulatory  Vitals:   10/07/22 0901  BP: (!) 149/91  Pulse: 82  Resp: 15  Temp: (!) 96.5 F (35.8 C)  SpO2: 99%   Filed Weights   10/07/22 0901  Weight: 171 lb 4.8 oz (77.7 kg)    GENERAL:alert, no distress and comfortable SKIN: skin color, texture, turgor are normal, no rashes or significant lesions EYES: normal, conjunctiva are pink and non-injected, sclera clear OROPHARYNX:no exudate, no erythema and lips, buccal mucosa, and tongue normal  NECK: supple, thyroid normal size, non-tender, without nodularity LYMPH:  no palpable lymphadenopathy in the cervical, axillary or inguinal LUNGS: clear to auscultation and percussion with normal breathing effort HEART: regular rate & rhythm and no murmurs and no lower extremity edema ABDOMEN:abdomen soft, non-tender and normal bowel sounds Musculoskeletal:no cyanosis of digits and no clubbing  PSYCH: alert & oriented x 3 with fluent speech NEURO: no focal motor/sensory deficits  LABORATORY DATA:  I have reviewed the data as listed Lab Results  Component Value Date   WBC 9.5 10/07/2022   HGB 12.8 (L) 10/07/2022   HCT 38.7 (L) 10/07/2022   MCV 87.8 10/07/2022   PLT 155 10/07/2022   Recent Labs    09/09/22 0827 09/23/22 0857 10/07/22 0845  NA 139 140 139  K 3.6 3.7 3.7  CL 110 109 106  CO2 23 24 23   GLUCOSE 103* 119* 139*  BUN 15 19 17   CREATININE 0.86 0.90 1.04  CALCIUM 8.8* 8.9 9.2  GFRNONAA >60 >60 >60  PROT 6.9 6.7  7.2  ALBUMIN 3.7 3.7 4.1  AST 20 24 35  ALT 14 16 24   ALKPHOS 78 78 69  BILITOT 0.6 0.3 0.8    RADIOGRAPHIC STUDIES: I have personally reviewed the radiological images  as listed and agreed with the findings in the report. No results found.

## 2022-10-07 NOTE — Progress Notes (Signed)
Keep aloxi per MD

## 2022-10-07 NOTE — Progress Notes (Signed)
Having trouble sleeping - maybe 3-4 hours a night.

## 2022-10-09 ENCOUNTER — Inpatient Hospital Stay: Payer: PPO

## 2022-10-09 VITALS — BP 138/63 | HR 80 | Resp 18

## 2022-10-09 DIAGNOSIS — Z5111 Encounter for antineoplastic chemotherapy: Secondary | ICD-10-CM | POA: Diagnosis not present

## 2022-10-09 DIAGNOSIS — C184 Malignant neoplasm of transverse colon: Secondary | ICD-10-CM

## 2022-10-09 MED ORDER — HEPARIN SOD (PORK) LOCK FLUSH 100 UNIT/ML IV SOLN
500.0000 [IU] | Freq: Once | INTRAVENOUS | Status: AC | PRN
  Administered 2022-10-09: 500 [IU]
  Filled 2022-10-09: qty 5

## 2022-10-09 MED ORDER — SODIUM CHLORIDE 0.9% FLUSH
10.0000 mL | INTRAVENOUS | Status: DC | PRN
  Administered 2022-10-09: 10 mL
  Filled 2022-10-09: qty 10

## 2022-10-09 NOTE — Patient Instructions (Signed)

## 2022-10-11 DIAGNOSIS — C189 Malignant neoplasm of colon, unspecified: Secondary | ICD-10-CM | POA: Diagnosis not present

## 2022-10-16 ENCOUNTER — Encounter: Payer: Self-pay | Admitting: Internal Medicine

## 2022-10-16 ENCOUNTER — Other Ambulatory Visit: Payer: Self-pay | Admitting: Oncology

## 2022-10-16 NOTE — Telephone Encounter (Signed)
Dr. Darrall Dears patient. She will refill tomorrow

## 2022-10-17 ENCOUNTER — Encounter: Payer: Self-pay | Admitting: Internal Medicine

## 2022-10-17 MED ORDER — HYDROCODONE-ACETAMINOPHEN 5-325 MG PO TABS: 1.0000 | ORAL_TABLET | Freq: Four times a day (QID) | ORAL | 0 refills | Status: DC | PRN

## 2022-10-18 MED FILL — Dexamethasone Sodium Phosphate Inj 100 MG/10ML: INTRAMUSCULAR | Qty: 1 | Status: AC

## 2022-10-21 ENCOUNTER — Encounter: Payer: Self-pay | Admitting: Internal Medicine

## 2022-10-21 ENCOUNTER — Inpatient Hospital Stay: Payer: PPO | Attending: Internal Medicine | Admitting: Internal Medicine

## 2022-10-21 ENCOUNTER — Inpatient Hospital Stay: Payer: PPO

## 2022-10-21 VITALS — BP 144/76 | HR 59 | Temp 97.5°F | Resp 15 | Wt 175.0 lb

## 2022-10-21 DIAGNOSIS — T451X5A Adverse effect of antineoplastic and immunosuppressive drugs, initial encounter: Secondary | ICD-10-CM

## 2022-10-21 DIAGNOSIS — C189 Malignant neoplasm of colon, unspecified: Secondary | ICD-10-CM | POA: Diagnosis not present

## 2022-10-21 DIAGNOSIS — E876 Hypokalemia: Secondary | ICD-10-CM

## 2022-10-21 DIAGNOSIS — C184 Malignant neoplasm of transverse colon: Secondary | ICD-10-CM | POA: Diagnosis not present

## 2022-10-21 DIAGNOSIS — D6481 Anemia due to antineoplastic chemotherapy: Secondary | ICD-10-CM

## 2022-10-21 DIAGNOSIS — F5101 Primary insomnia: Secondary | ICD-10-CM

## 2022-10-21 DIAGNOSIS — Z452 Encounter for adjustment and management of vascular access device: Secondary | ICD-10-CM | POA: Insufficient documentation

## 2022-10-21 DIAGNOSIS — G620 Drug-induced polyneuropathy: Secondary | ICD-10-CM

## 2022-10-21 DIAGNOSIS — Z5111 Encounter for antineoplastic chemotherapy: Secondary | ICD-10-CM | POA: Insufficient documentation

## 2022-10-21 LAB — COMPREHENSIVE METABOLIC PANEL
ALT: 15 U/L (ref 0–44)
AST: 21 U/L (ref 15–41)
Albumin: 3.9 g/dL (ref 3.5–5.0)
Alkaline Phosphatase: 85 U/L (ref 38–126)
Anion gap: 5 (ref 5–15)
BUN: 23 mg/dL (ref 8–23)
CO2: 25 mmol/L (ref 22–32)
Calcium: 8.9 mg/dL (ref 8.9–10.3)
Chloride: 109 mmol/L (ref 98–111)
Creatinine, Ser: 1.04 mg/dL (ref 0.61–1.24)
GFR, Estimated: >60 mL/min (ref >60)
Glucose, Bld: 81 mg/dL (ref 70–99)
Potassium: 3.3 mmol/L — ABNORMAL LOW (ref 3.5–5.1)
Sodium: 139 mmol/L (ref 135–145)
Total Bilirubin: 0.6 mg/dL (ref 0.3–1.2)
Total Protein: 6.4 g/dL — ABNORMAL LOW (ref 6.5–8.1)

## 2022-10-21 LAB — CBC WITH DIFFERENTIAL/PLATELET
Abs Immature Granulocytes: 0.02 10*3/uL (ref 0.00–0.07)
Basophils Absolute: 0.1 10*3/uL (ref 0.0–0.1)
Basophils Relative: 1 %
Eosinophils Absolute: 0.2 10*3/uL (ref 0.0–0.5)
Eosinophils Relative: 2 %
HCT: 37.3 % — ABNORMAL LOW (ref 39.0–52.0)
Hemoglobin: 12.2 g/dL — ABNORMAL LOW (ref 13.0–17.0)
Immature Granulocytes: 0 %
Lymphocytes Relative: 48 %
Lymphs Abs: 4 10*3/uL (ref 0.7–4.0)
MCH: 30.1 pg (ref 26.0–34.0)
MCHC: 32.7 g/dL (ref 30.0–36.0)
MCV: 92.1 fL (ref 80.0–100.0)
Monocytes Absolute: 0.9 10*3/uL (ref 0.1–1.0)
Monocytes Relative: 11 %
Neutro Abs: 3.1 10*3/uL (ref 1.7–7.7)
Neutrophils Relative %: 38 %
Platelets: 154 10*3/uL (ref 150–400)
RBC: 4.05 MIL/uL — ABNORMAL LOW (ref 4.22–5.81)
RDW: 24 % — ABNORMAL HIGH (ref 11.5–15.5)
WBC: 8.3 10*3/uL (ref 4.0–10.5)
nRBC: 0 % (ref 0.0–0.2)

## 2022-10-21 MED ORDER — OXALIPLATIN CHEMO INJECTION 100 MG/20ML
50.0000 mg/m2 | Freq: Once | INTRAVENOUS | Status: AC
  Administered 2022-10-21: 100 mg via INTRAVENOUS
  Filled 2022-10-21: qty 20

## 2022-10-21 MED ORDER — FLUOROURACIL CHEMO INJECTION 2.5 GM/50ML
400.0000 mg/m2 | Freq: Once | INTRAVENOUS | Status: AC
  Administered 2022-10-21: 750 mg via INTRAVENOUS
  Filled 2022-10-21: qty 15

## 2022-10-21 MED ORDER — HEPARIN SOD (PORK) LOCK FLUSH 100 UNIT/ML IV SOLN
500.0000 [IU] | Freq: Once | INTRAVENOUS | Status: DC | PRN
  Filled 2022-10-21: qty 5

## 2022-10-21 MED ORDER — SODIUM CHLORIDE 0.9 % IV SOLN
2400.0000 mg/m2 | INTRAVENOUS | Status: DC
  Administered 2022-10-21: 5000 mg via INTRAVENOUS
  Filled 2022-10-21: qty 100

## 2022-10-21 MED ORDER — DEXTROSE 5 % IV SOLN
Freq: Once | INTRAVENOUS | Status: AC
  Filled 2022-10-21: qty 250

## 2022-10-21 MED ORDER — POTASSIUM CHLORIDE CRYS ER 20 MEQ PO TBCR: 20.0000 meq | EXTENDED_RELEASE_TABLET | Freq: Every day | ORAL | 0 refills | Status: DC

## 2022-10-21 MED ORDER — SODIUM CHLORIDE 0.9 % IV SOLN
10.0000 mg | Freq: Once | INTRAVENOUS | Status: AC
  Administered 2022-10-21: 10 mg via INTRAVENOUS
  Filled 2022-10-21: qty 10

## 2022-10-21 MED ORDER — LEUCOVORIN CALCIUM INJECTION 350 MG
400.0000 mg/m2 | Freq: Once | INTRAVENOUS | Status: AC
  Administered 2022-10-21: 752 mg via INTRAVENOUS
  Filled 2022-10-21: qty 37.6

## 2022-10-21 MED ORDER — PALONOSETRON HCL INJECTION 0.25 MG/5ML
0.2500 mg | Freq: Once | INTRAVENOUS | Status: AC
  Administered 2022-10-21: 0.25 mg via INTRAVENOUS
  Filled 2022-10-21: qty 5

## 2022-10-21 NOTE — Progress Notes (Signed)
[facility] CONSULT NOTE  Patient Care Team: Nathan Pitch, MD as PCP - General (Family Medicine) Nathan Jacks, RN as Oncology Nurse Navigator   CANCER STAGING   Cancer Staging  Cancer of transverse colon Staging form: Colon and Rectum, AJCC 8th Edition - Clinical: Stage IIIC (cT4b, cN1a, cM0) - Signed by Nathan Canary, MD on 07/12/2022 Stage prefix: Initial diagnosis Total positive nodes: 1 Total nodes examined: 24   ASSESSMENT & PLAN:  [name] 72 y.o. male with pmh of colon cancer s/p left sided colostomy in 2011, chemotherapy, neuropathy follows with Medical Oncology for Stage IIIC colon cancer.   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 S/p resection and reanastomosis with Dr. Lysle Pearl on 12/13. CT imaging negative for metastasis.  Plan for total 6 months of treatment.  -Oxaliplatin held with cycle 4 due to worsening cold induced neuropathy.  Neuropathy has improved and returned to his baseline.  I discussed about adding oxaliplatin at 50 mg/m today and monitor his neuropathy closely.  If his neuropathy worsens again, we will discontinue permanently.  Labs reviewed and acceptable for treatment.  Will proceed with 5-FU cycle 6 today.  Day 3 pump disconnect.  -Patient has antiemetics-Zofran and Compazine as needed.  Using dexamethasone 4 mg for 2 days after chemotherapy.  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  Oxaliplatin held with cycle 4 and 5.  Worsening cold induced neuropathy.  Now with improvement in weather, his neuropathy has improved. -Will add again at 50 mg/m.  # Insomnia - On melatonin 5 mg nightly as needed.  It is helping with better sleep but he wakes up at 5 AM.  We discussed about increasing the dose to 10 mg as needed.  # Arthritis -Patient has multiple arthritic joints including shoulders or hips.  Worsened  with chemotherapy -Takes ibuprofen in the morning and Norco at night.    # Hypokalemia, mild -Potassium 3.3.  Sent prescription for K-Dur 20 milliequivalents daily for 3 days  # Iron deficiency anemia - s/p IV Venofer x 2 doses.  Continue with iron pills.  #History of colon cancer s/p left sided colostomy, chemo in 2011 -Surgery and had adjuvant chemotherapy.  No records are available.  # Access -port placed on 08/08/2022 by Dr. Lysle Pearl  Orders Placed This Encounter  Procedures   CBC with Differential    Standing Status:   Future    Standing Expiration Date:   11/04/2023   Comprehensive metabolic panel    Standing Status:   Future    Standing Expiration Date:   11/04/2023   RTC in 2 weeks for MD visit, labs, cycle 7.  FOLFOX.  Pump disconnect on day 3.  The total time spent in the appointment was 30 minutes encounter with patients including review of chart and various tests results, discussions about plan of care and coordination of care plan   All questions were answered. The patient knows to call the clinic with any problems, questions or concerns. No barriers to learning was detected.  Nathan Canary, MD 4/1/202410:28 AM   HISTORY OF PRESENTING ILLNESS:  [name] 72 y.o. male with pmh of colon cancer s/p left sided colostomy in 2011, chemotherapy, neuropathy follows with Medical Oncology for Stage IIIC colon cancer.   Interval history Patient in today accompanied with wife prior to cycle 6 of FOLFOX.  Overall he is tolerating treatments well.  He is taking Decadron for  3 days after chemo and doing Zofran on Thursday and Friday before breakfast.  This regimen is really helping him with nausea.  No vomiting.  Appetite is good.  Energy level is good.  Has diffuse body aches since starting chemo.  Controlled with ibuprofen in the morning and Norco at night.  Sleep has improved with adding melatonin.  Neuropathy has returned to baseline after stopping oxaliplatin.  Also with  improvement in the cold weather .  I have reviewed his chart and materials related to his cancer extensively and collaborated history with the patient. Summary of oncologic history is as follows: Oncology History Overview Note  Patient has a history of colon cancer in 2011 treated with Dr. Inez Pilgrim with radiation followed by surgery s/p left sided colostomy and adjuvant chemo likely FOLFOX. Records unavailable.    Cancer of transverse colon  06/29/2022 Initial Diagnosis   Presented to ED for abdominal pain, nausea and vomiting.    Imaging   CT abdo pelvis IMPRESSION: 1. Patient is status post abdominoperineal resection with left lower quadrant ostomy. The ascending and transverse colon are moderately distended and there is abrupt caliber change at the distal transverse colon/splenic flexure where there is irregular masslike thickening. Findings are suspicious for colonic obstruction due to a mass at this location. There is no dilated small bowel. 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 2. Slightly thick-walled presacral fluid collection with peripheral calcification, may reflect chronic postoperative collection. 3. Gallstones. 4. Aortic atherosclerosis.  CT chest IMPRESSION: 1. No acute intrathoracic process. No evidence of intrathoracic metastases.    Procedure   Colonoscopy on 06/29/22 by Dr. Lysle Pearl A fungating partially obstructing mass in mid transverse colon.   07/03/2022 Pathology Results   DIAGNOSIS: A. 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 - SEE CANCER SUMMARY BELOW. - TATTOO INK. - SEPARATE 4.5 CM LENGTH OF SMALL INTESTINE.  CANCER CASE SUMMARY: COLON AND RECTUM Standard(s): AJCC-UICC 8  SPECIMEN Procedure: Transverse colectomy  TUMOR Tumor Site: Transverse colon Histologic Type: Adenocarcinoma Histologic Grade: G2,  moderately differentiated Tumor Size: Greatest dimension: 6.1 cm Tumor Extent: Directly invades or adheres to adjacent structures (small intestine) Macroscopic Tumor Perforation: Not identified Lymphatic and/or Vascular Invasion: Large vessel (venous), extramural Perineural Invasion: Present Tumor Budding Score: High (10 or more) Treatment Effect: No known presurgical therapy  MARGINS Margin Status for Invasive Carcinoma: All margins negative for invasive carcinoma      Margins examined: Proximal, distal, and mesenteric Margin Status for Non-Invasive Tumor: All margins negative for high-grade dysplasia/intramucosal carcinoma and low-grade dysplasia  REGIONAL LYMPH NODES Regional Lymph Nodes: Regional lymph nodes present      Tumor present in regional lymph node(s)           Number of Lymph Nodes with Tumor: 1      Number of Lymph Nodes Examined: 24  Tumor Deposits: Not identified  DISTANT METASTASES Distant Site(s) Involved, if applicable: Not applicable  PATHOLOGIC STAGE CLASSIFICATION (pTNM, AJCC 8th Edition): Modified Classification: Not applicable [id number]      T suffix: Not applicable [id number] pM not applicable - pM cannot be determined from the submitted specimen(s)    07/03/2022 Procedure   Exploratory laparotomy, en bloc colon resection, reanastomosis, primary repair of parastomal hernia by Dr. Lysle Pearl     07/12/2022 Cancer Staging   Staging form: Colon and Rectum, AJCC 8th Edition - Clinical: Stage IIIC (cT4b, cN1a, cM0) - Signed by Darrall Dears,  Bridget Hartshorn, MD on 07/12/2022 Stage prefix: Initial diagnosis Total positive nodes: 1 Total nodes examined: 24   08/12/2022 -  Chemotherapy   Patient is on Treatment Plan : COLORECTAL FOLFOX q14d x 6 months       MEDICAL HISTORY:  Past Medical History:  Diagnosis Date   Colon cancer    remission 2011   Heart murmur    as a child from rheumatic fever   Malignant neoplasm of transverse colon 2024   Microcytic anemia     Neuropathy    Presence of colostomy    Protein-calorie malnutrition, severe     SURGICAL HISTORY: Past Surgical History:  Procedure Laterality Date   APPENDECTOMY     COLECTOMY WITH COLOSTOMY CREATION/HARTMANN PROCEDURE N/A 07/03/2022   Procedure: COLECTOMY WITH COLOSTOMY CREATION/HARTMANN PROCEDURE;  Surgeon: Benjamine Sprague, DO;  Location: [facility] ORS;  Service: General;  Laterality: N/A;   COLONOSCOPY N/A 06/29/2022   Procedure: COLONOSCOPY;  Surgeon: Benjamine Sprague, DO;  Location: [facility];  Service: General;  Laterality: N/A;   PORTACATH PLACEMENT Right 08/08/2022   Procedure: INSERTION PORT-A-CATH;  Surgeon: Benjamine Sprague, DO;  Location: [facility] ORS;  Service: General;  Laterality: Right;    SOCIAL HISTORY: Social History   Socioeconomic History   Marital status: Single    Spouse name: Not on file   Number of children: Not on file   Years of education: Not on file   Highest education level: Not on file  Occupational History   Not on file  Tobacco Use   Smoking status: Never   Smokeless tobacco: Never  Vaping Use   Vaping Use: Never used  Substance and Sexual Activity   Alcohol use: Not Currently   Drug use: Never   Sexual activity: Not on file  Other Topics Concern   Not on file  Social History Narrative   Not on file   Social Determinants of Health   Financial Resource Strain: Not on file  Food Insecurity: Unknown (06/29/2022)   Hunger Vital Sign    Worried About Running Out of Food in the Last Year: Never true    Ran Out of Food in the Last Year: Not on file  Transportation Needs: No Transportation Needs (06/29/2022)   PRAPARE - Hydrologist (Medical): No    Lack of Transportation (Non-Medical): No  Physical Activity: Not on file  Stress: Not on file  Social Connections: Not on file  Intimate Partner Violence: Not At Risk (06/29/2022)   Humiliation, Afraid, Rape, and Kick questionnaire    Fear of Current or Ex-Partner: No     Emotionally Abused: No    Physically Abused: No    Sexually Abused: No    FAMILY HISTORY: History reviewed. No pertinent family history.  ALLERGIES:  has No Known Allergies.  MEDICATIONS:  Current Outpatient Medications  Medication Sig Dispense Refill   potassium chloride SA (KLOR-CON M) 20 MEQ tablet Take 1 tablet (20 mEq total) by mouth daily for 3 days. 3 tablet 0   dexamethasone (DECADRON) 4 MG tablet Take 1 tablet (4 mg total) by mouth daily. Start the day after chemotherapy for 2 days. Take with food. 30 tablet 1   Docusate Sodium (DSS) 100 MG CAPS Take 1 capsule by mouth 2 (two) times daily. As needed for mild constipation.     Ferrous Sulfate (IRON PO) Take 1 tablet by mouth 3 (three) times a week.     HYDROcodone-acetaminophen (NORCO) 5-325 MG tablet Take 1 tablet by  mouth every 6 (six) hours as needed for up to 30 doses for moderate pain. 30 tablet 0   ibuprofen (ADVIL) 800 MG tablet 800 mg.     lidocaine-prilocaine (EMLA) cream Apply to affected area once 30 g 3   melatonin 5 MG TABS Take 1 tablet (5 mg total) by mouth at bedtime as needed. 30 tablet 0   Multiple Vitamin (MULTIVITAMIN) tablet Take 1 tablet by mouth daily.     ondansetron (ZOFRAN) 8 MG tablet Take 1 tablet (8 mg total) by mouth every 8 (eight) hours as needed for nausea or vomiting. Start on the third day after chemotherapy. 30 tablet 1   prochlorperazine (COMPAZINE) 10 MG tablet Take 1 tablet (10 mg total) by mouth every 6 (six) hours as needed for nausea or vomiting. 30 tablet 1   No current facility-administered medications for this visit.   Facility-Administered Medications Ordered in Other Visits  Medication Dose Route Frequency Provider Last Rate Last Admin   fluorouracil (ADRUCIL) 5,000 mg in sodium chloride 0.9 % 150 mL chemo infusion  2,400 mg/m2 (Treatment Plan Recorded) Intravenous 1 day or 1 dose Nathan Canary, MD       fluorouracil (ADRUCIL) chemo injection 750 mg  400 mg/m2 (Treatment Plan  Recorded) Intravenous Once Nathan Canary, MD       heparin lock flush 100 unit/mL  500 Units Intracatheter Once PRN Nathan Canary, MD       leucovorin 752 mg in dextrose 5 % 250 mL infusion  400 mg/m2 (Treatment Plan Recorded) Intravenous Once Nathan Canary, MD       oxaliplatin (ELOXATIN) 100 mg in dextrose 5 % 500 mL chemo infusion  50 mg/m2 (Treatment Plan Recorded) Intravenous Once Nathan Canary, MD       palonosetron (ALOXI) injection 0.25 mg  0.25 mg Intravenous Once Nathan Canary, MD        REVIEW OF SYSTEMS:   Pertinent information mentioned in HPI All other systems were reviewed with the patient and are negative.  PHYSICAL EXAMINATION: ECOG PERFORMANCE STATUS: 1 - Symptomatic but completely ambulatory  Vitals:   10/21/22 0921  BP: (!) 144/76  Pulse: (!) 59  Resp: 15  Temp: (!) 97.5 F (36.4 C)  SpO2: 100%   Filed Weights   10/21/22 0921  Weight: 175 lb (79.4 kg)    GENERAL:alert, no distress and comfortable SKIN: skin color, texture, turgor are normal, no rashes or significant lesions EYES: normal, conjunctiva are pink and non-injected, sclera clear OROPHARYNX:no exudate, no erythema and lips, buccal mucosa, and tongue normal  NECK: supple, thyroid normal size, non-tender, without nodularity LYMPH:  no palpable lymphadenopathy in the cervical, axillary or inguinal LUNGS: clear to auscultation and percussion with normal breathing effort HEART: regular rate & rhythm and no murmurs and no lower extremity edema ABDOMEN:abdomen soft, non-tender and normal bowel sounds Musculoskeletal:no cyanosis of digits and no clubbing  PSYCH: alert & oriented x 3 with fluent speech NEURO: no focal motor/sensory deficits  LABORATORY DATA:  I have reviewed the data as listed Lab Results  Component Value Date   WBC 8.3 10/21/2022   HGB 12.2 (L) 10/21/2022   HCT 37.3 (L) 10/21/2022   MCV 92.1 10/21/2022   PLT 154 10/21/2022   Recent Labs    09/23/22 0857  10/07/22 0845 10/21/22 0913  NA 140 139 139  K 3.7 3.7 3.3*  CL 109 106 109  CO2 24 23 25   GLUCOSE 119* 139* 81  BUN 19 17 23   CREATININE 0.90  1.04 1.04  CALCIUM 8.9 9.2 8.9  GFRNONAA >60 >60 >60  PROT 6.7 7.2 6.4*  ALBUMIN 3.7 4.1 3.9  AST 24 35 21  ALT 16 24 15   ALKPHOS 78 69 85  BILITOT 0.3 0.8 0.6    RADIOGRAPHIC STUDIES: I have personally reviewed the radiological images as listed and agreed with the findings in the report. No results found.

## 2022-10-21 NOTE — Patient Instructions (Signed)
[name]  Discharge Instructions: Thank you for choosing [facility] to provide your oncology and hematology care.  If you have a lab appointment with the [facility], please go directly to the [facility] and check in at the registration area.  Wear comfortable clothing and clothing appropriate for easy access to any Portacath or PICC line.   We strive to give you quality time with your provider. You may need to reschedule your appointment if you arrive late (15 or more minutes).  Arriving late affects you and other patients whose appointments are after yours.  Also, if you miss three or more appointments without notifying the office, you may be dismissed from the clinic at the provider's discretion.      For prescription refill requests, have your pharmacy contact our office and allow 72 hours for refills to be completed.    Today you received the following chemotherapy and/or immunotherapy agents OXALIPLATIN, LEUCOVORIN, AND 5 FU      To help prevent nausea and vomiting after your treatment, we encourage you to take your nausea medication as directed.  BELOW ARE SYMPTOMS THAT SHOULD BE REPORTED IMMEDIATELY: *FEVER GREATER THAN 100.4 F (38 C) OR HIGHER *CHILLS OR SWEATING *NAUSEA AND VOMITING THAT IS NOT CONTROLLED WITH YOUR NAUSEA MEDICATION *UNUSUAL SHORTNESS OF BREATH *UNUSUAL BRUISING OR BLEEDING *URINARY PROBLEMS (pain or burning when urinating, or frequent urination) *BOWEL PROBLEMS (unusual diarrhea, constipation, pain near the anus) TENDERNESS IN MOUTH AND THROAT WITH OR WITHOUT PRESENCE OF ULCERS (sore throat, sores in mouth, or a toothache) UNUSUAL RASH, SWELLING OR PAIN  UNUSUAL VAGINAL DISCHARGE OR ITCHING   Items with * indicate a potential emergency and should be followed up as soon as possible or go to the Emergency Department if any problems should occur.  Please show the CHEMOTHERAPY ALERT CARD or IMMUNOTHERAPY  ALERT CARD at check-in to the Emergency Department and triage nurse.  Should you have questions after your visit or need to cancel or reschedule your appointment, please contact [facility]  [phone] and follow the prompts.  Office hours are 8:00 a.m. to 4:30 p.m. Monday - Friday. Please note that voicemails left after 4:00 p.m. may not be returned until the following business day.  We are closed weekends and major holidays. You have access to a nurse at all times for urgent questions. Please call the main number to the clinic [phone] and follow the prompts.  For any non-urgent questions, you may also contact your provider using MyChart. We now offer e-Visits for anyone 67 and older to request care online for non-urgent symptoms. For details visit [email or web address].   Also download the MyChart app! Go to the app store, search "MyChart", open the app, select [facility], and log in with your MyChart username and password.  Oxaliplatin Injection What is this medication? OXALIPLATIN (ox AL i PLA tin) treats colorectal cancer. It works by slowing down the growth of cancer cells. This medicine may be used for other purposes; ask your health care provider or pharmacist if you have questions. COMMON BRAND NAME(S): Eloxatin What should I tell my care team before I take this medication? They need to know if you have any of these conditions: Heart disease History of irregular heartbeat or rhythm Liver disease Low blood cell levels (white cells, red cells, and platelets) Lung or breathing disease, such as asthma Take medications that treat or prevent blood clots Tingling of the fingers,  toes, or other nerve disorder An unusual or allergic reaction to oxaliplatin, other medications, foods, dyes, or preservatives If you or your partner are pregnant or trying to get pregnant Breast-feeding How should I use this medication? This medication is injected into  a vein. It is given by your care team in a hospital or clinic setting. Talk to your care team about the use of this medication in children. Special care may be needed. Overdosage: If you think you have taken too much of this medicine contact a poison control center or emergency room at once. NOTE: This medicine is only for you. Do not share this medicine with others. What if I miss a dose? Keep appointments for follow-up doses. It is important not to miss a dose. Call your care team if you are unable to keep an appointment. What may interact with this medication? Do not take this medication with any of the following: Cisapride Dronedarone Pimozide Thioridazine This medication may also interact with the following: Aspirin and aspirin-like medications Certain medications that treat or prevent blood clots, such as warfarin, apixaban, dabigatran, and rivaroxaban Cisplatin Cyclosporine Diuretics Medications for infection, such as acyclovir, adefovir, amphotericin B, bacitracin, cidofovir, foscarnet, ganciclovir, gentamicin, pentamidine, vancomycin NSAIDs, medications for pain and inflammation, such as ibuprofen or naproxen Other medications that cause heart rhythm changes Pamidronate Zoledronic acid This list may not describe all possible interactions. Give your health care provider a list of all the medicines, herbs, non-prescription drugs, or dietary supplements you use. Also tell them if you smoke, drink alcohol, or use illegal drugs. Some items may interact with your medicine. What should I watch for while using this medication? Your condition will be monitored carefully while you are receiving this medication. You may need blood work while taking this medication. This medication may make you feel generally unwell. This is not uncommon as chemotherapy can affect healthy cells as well as cancer cells. Report any side effects. Continue your course of treatment even though you feel ill unless  your care team tells you to stop. This medication may increase your risk of getting an infection. Call your care team for advice if you get a fever, chills, sore throat, or other symptoms of a cold or flu. Do not treat yourself. Try to avoid being around people who are sick. Avoid taking medications that contain aspirin, acetaminophen, ibuprofen, naproxen, or ketoprofen unless instructed by your care team. These medications may hide a fever. Be careful brushing or flossing your teeth or using a toothpick because you may get an infection or bleed more easily. If you have any dental work done, tell your dentist you are receiving this medication. This medication can make you more sensitive to cold. Do not drink cold drinks or use ice. Cover exposed skin before coming in contact with cold temperatures or cold objects. When out in cold weather wear warm clothing and cover your mouth and nose to warm the air that goes into your lungs. Tell your care team if you get sensitive to the cold. Talk to your care team if you or your partner are pregnant or think either of you might be pregnant. This medication can cause serious birth defects if taken during pregnancy and for 9 months after the last dose. A negative pregnancy test is required before starting this medication. A reliable form of contraception is recommended while taking this medication and for 9 months after the last dose. Talk to your care team about effective forms of contraception. Do not  Do not father a child while taking this medication and for 6 months after the last dose. Use a condom while having sex during this time period. Do not breastfeed while taking this medication and for 3 months after the last dose. This medication may cause infertility. Talk to your care team if you are concerned about your fertility. What side effects may I notice from receiving this medication? Side effects that you should report to your care team as soon as possible: Allergic  reactions--skin rash, itching, hives, swelling of the face, lips, tongue, or throat Bleeding--bloody or black, tar-like stools, vomiting blood or brown material that looks like coffee grounds, red or dark brown urine, small red or purple spots on skin, unusual bruising or bleeding Dry cough, shortness of breath or trouble breathing Heart rhythm changes--fast or irregular heartbeat, dizziness, feeling faint or lightheaded, chest pain, trouble breathing Infection--fever, chills, cough, sore throat, wounds that don't heal, pain or trouble when passing urine, general feeling of discomfort or being unwell Liver injury--right upper belly pain, loss of appetite, nausea, light-colored stool, dark yellow or brown urine, yellowing skin or eyes, unusual weakness or fatigue Low red blood cell level--unusual weakness or fatigue, dizziness, headache, trouble breathing Muscle injury--unusual weakness or fatigue, muscle pain, dark yellow or brown urine, decrease in amount of urine Pain, tingling, or numbness in the hands or feet Sudden and severe headache, confusion, change in vision, seizures, which may be signs of posterior reversible encephalopathy syndrome (PRES) Unusual bruising or bleeding Side effects that usually do not require medical attention (report to your care team if they continue or are bothersome): Diarrhea Nausea Pain, redness, or swelling with sores inside the mouth or throat Unusual weakness or fatigue Vomiting This list may not describe all possible side effects. Call your doctor for medical advice about side effects. You may report side effects to FDA at 1-800-FDA-1088. Where should I keep my medication? This medication is given in a hospital or clinic. It will not be stored at home. NOTE: This sheet is a summary. It may not cover all possible information. If you have questions about this medicine, talk to your doctor, pharmacist, or health care provider.  2023 Elsevier/Gold Standard  (2007-08-29 00:00:00)  Leucovorin Injection What is this medication? LEUCOVORIN (loo koe VOR in) prevents side effects from certain medications, such as methotrexate. It works by increasing folate levels. This helps protect healthy cells in your body. It may also be used to treat anemia caused by low levels of folate. It can also be used with fluorouracil, a type of chemotherapy, to treat colorectal cancer. It works by increasing the effects of fluorouracil in the body. This medicine may be used for other purposes; ask your health care provider or pharmacist if you have questions. What should I tell my care team before I take this medication? They need to know if you have any of these conditions: Anemia from low levels of vitamin B12 in the blood An unusual or allergic reaction to leucovorin, folic acid, other medications, foods, dyes, or preservatives Pregnant or trying to get pregnant Breastfeeding How should I use this medication? This medication is injected into a vein or a muscle. It is given by your care team in a hospital or clinic setting. Talk to your care team about the use of this medication in children. Special care may be needed. Overdosage: If you think you have taken too much of this medicine contact a poison control center or emergency room at once. NOTE:   is only for you. Do not share this medicine with others. What if I miss a dose? Keep appointments for follow-up doses. It is important not to miss your dose. Call your care team if you are unable to keep an appointment. What may interact with this medication? Capecitabine Fluorouracil Phenobarbital Phenytoin Primidone Trimethoprim;sulfamethoxazole This list may not describe all possible interactions. Give your health care provider a list of all the medicines, herbs, non-prescription drugs, or dietary supplements you use. Also tell them if you smoke, drink alcohol, or use illegal drugs. Some items may interact  with your medicine. What should I watch for while using this medication? Your condition will be monitored carefully while you are receiving this medication. This medication may increase the side effects of 5-fluorouracil. Tell your care team if you have diarrhea or mouth sores that do not get better or that get worse. What side effects may I notice from receiving this medication? Side effects that you should report to your care team as soon as possible: Allergic reactions--skin rash, itching, hives, swelling of the face, lips, tongue, or throat This list may not describe all possible side effects. Call your doctor for medical advice about side effects. You may report side effects to FDA at 1-800-FDA-1088. Where should I keep my medication? This medication is given in a hospital or clinic. It will not be stored at home. NOTE: This sheet is a summary. It may not cover all possible information. If you have questions about this medicine, talk to your doctor, pharmacist, or health care provider.  2023 Elsevier/Gold Standard (2021-11-16 00:00:00)  Fluorouracil Injection What is this medication? FLUOROURACIL (flure oh YOOR a sil) treats some types of cancer. It works by slowing down the growth of cancer cells. This medicine may be used for other purposes; ask your health care provider or pharmacist if you have questions. COMMON BRAND NAME(S): Adrucil What should I tell my care team before I take this medication? They need to know if you have any of these conditions: Blood disorders Dihydropyrimidine dehydrogenase (DPD) deficiency Infection, such as chickenpox, cold sores, herpes Kidney disease Liver disease Poor nutrition Recent or ongoing radiation therapy An unusual or allergic reaction to fluorouracil, other medications, foods, dyes, or preservatives If you or your partner are pregnant or trying to get pregnant Breast-feeding How should I use this medication? This medication is injected  into a vein. It is administered by your care team in a hospital or clinic setting. Talk to your care team about the use of this medication in children. Special care may be needed. Overdosage: If you think you have taken too much of this medicine contact a poison control center or emergency room at once. NOTE: This medicine is only for you. Do not share this medicine with others. What if I miss a dose? Keep appointments for follow-up doses. It is important not to miss your dose. Call your care team if you are unable to keep an appointment. What may interact with this medication? Do not take this medication with any of the following: Live virus vaccines This medication may also interact with the following: Medications that treat or prevent blood clots, such as warfarin, enoxaparin, dalteparin This list may not describe all possible interactions. Give your health care provider a list of all the medicines, herbs, non-prescription drugs, or dietary supplements you use. Also tell them if you smoke, drink alcohol, or use illegal drugs. Some items may interact with your medicine. What should I watch for while using  while using this medication? Your condition will be monitored carefully while you are receiving this medication. This medication may make you feel generally unwell. This is not uncommon as chemotherapy can affect healthy cells as well as cancer cells. Report any side effects. Continue your course of treatment even though you feel ill unless your care team tells you to stop. In some cases, you may be given additional medications to help with side effects. Follow all directions for their use. This medication may increase your risk of getting an infection. Call your care team for advice if you get a fever, chills, sore throat, or other symptoms of a cold or flu. Do not treat yourself. Try to avoid being around people who are sick. This medication may increase your risk to bruise or bleed. Call your care team if  you notice any unusual bleeding. Be careful brushing or flossing your teeth or using a toothpick because you may get an infection or bleed more easily. If you have any dental work done, tell your dentist you are receiving this medication. Avoid taking medications that contain aspirin, acetaminophen, ibuprofen, naproxen, or ketoprofen unless instructed by your care team. These medications may hide a fever. Do not treat diarrhea with over the counter products. Contact your care team if you have diarrhea that lasts more than 2 days or if it is severe and watery. This medication can make you more sensitive to the sun. Keep out of the sun. If you cannot avoid being in the sun, wear protective clothing and sunscreen. Do not use sun lamps, tanning beds, or tanning booths. Talk to your care team if you or your partner wish to become pregnant or think you might be pregnant. This medication can cause serious birth defects if taken during pregnancy and for 3 months after the last dose. A reliable form of contraception is recommended while taking this medication and for 3 months after the last dose. Talk to your care team about effective forms of contraception. Do not father a child while taking this medication and for 3 months after the last dose. Use a condom while having sex during this time period. Do not breastfeed while taking this medication. This medication may cause infertility. Talk to your care team if you are concerned about your fertility. What side effects may I notice from receiving this medication? Side effects that you should report to your care team as soon as possible: Allergic reactions--skin rash, itching, hives, swelling of the face, lips, tongue, or throat Heart attack--pain or tightness in the chest, shoulders, arms, or jaw, nausea, shortness of breath, cold or clammy skin, feeling faint or lightheaded Heart failure--shortness of breath, swelling of the ankles, feet, or hands, sudden weight  gain, unusual weakness or fatigue Heart rhythm changes--fast or irregular heartbeat, dizziness, feeling faint or lightheaded, chest pain, trouble breathing High ammonia level--unusual weakness or fatigue, confusion, loss of appetite, nausea, vomiting, seizures Infection--fever, chills, cough, sore throat, wounds that don't heal, pain or trouble when passing urine, general feeling of discomfort or being unwell Low red blood cell level--unusual weakness or fatigue, dizziness, headache, trouble breathing Pain, tingling, or numbness in the hands or feet, muscle weakness, change in vision, confusion or trouble speaking, loss of balance or coordination, trouble walking, seizures Redness, swelling, and blistering of the skin over hands and feet Severe or prolonged diarrhea Unusual bruising or bleeding Side effects that usually do not require medical attention (report to your care team if they continue or are bothersome): Dry   Increased tears Nausea Pain, redness, or swelling with sores inside the mouth or throat Sensitivity to light Vomiting This list may not describe all possible side effects. Call your doctor for medical advice about side effects. You may report side effects to FDA at 1-800-FDA-1088. Where should I keep my medication? This medication is given in a hospital or clinic. It will not be stored at home. NOTE: This sheet is a summary. It may not cover all possible information. If you have questions about this medicine, talk to your doctor, pharmacist, or health care provider.  2023 Elsevier/Gold Standard (2021-11-06 00:00:00)

## 2022-10-22 ENCOUNTER — Other Ambulatory Visit: Payer: Self-pay

## 2022-10-23 ENCOUNTER — Inpatient Hospital Stay: Payer: PPO

## 2022-10-23 VITALS — BP 125/76 | HR 62 | Temp 97.6°F | Resp 16

## 2022-10-23 DIAGNOSIS — C184 Malignant neoplasm of transverse colon: Secondary | ICD-10-CM

## 2022-10-23 DIAGNOSIS — Z5111 Encounter for antineoplastic chemotherapy: Secondary | ICD-10-CM | POA: Diagnosis not present

## 2022-10-23 MED ORDER — SODIUM CHLORIDE 0.9% FLUSH
10.0000 mL | INTRAVENOUS | Status: DC | PRN
  Administered 2022-10-23: 10 mL
  Filled 2022-10-23: qty 10

## 2022-10-23 MED ORDER — HEPARIN SOD (PORK) LOCK FLUSH 100 UNIT/ML IV SOLN
500.0000 [IU] | Freq: Once | INTRAVENOUS | Status: AC | PRN
  Administered 2022-10-23: 500 [IU]
  Filled 2022-10-23: qty 5

## 2022-10-23 NOTE — Patient Instructions (Signed)

## 2022-10-30 DIAGNOSIS — Z933 Colostomy status: Secondary | ICD-10-CM | POA: Diagnosis not present

## 2022-11-01 MED FILL — Dexamethasone Sodium Phosphate Inj 100 MG/10ML: INTRAMUSCULAR | Qty: 1 | Status: AC

## 2022-11-04 ENCOUNTER — Inpatient Hospital Stay: Payer: PPO

## 2022-11-04 ENCOUNTER — Inpatient Hospital Stay (HOSPITAL_BASED_OUTPATIENT_CLINIC_OR_DEPARTMENT_OTHER): Payer: PPO | Admitting: Internal Medicine

## 2022-11-04 ENCOUNTER — Inpatient Hospital Stay (HOSPITAL_BASED_OUTPATIENT_CLINIC_OR_DEPARTMENT_OTHER): Payer: PPO | Admitting: Nurse Practitioner

## 2022-11-04 VITALS — BP 140/87 | HR 57 | Temp 98.0°F | Resp 16

## 2022-11-04 VITALS — BP 150/90 | HR 63 | Temp 97.9°F | Resp 18 | Wt 176.4 lb

## 2022-11-04 DIAGNOSIS — C184 Malignant neoplasm of transverse colon: Secondary | ICD-10-CM

## 2022-11-04 DIAGNOSIS — G620 Drug-induced polyneuropathy: Secondary | ICD-10-CM

## 2022-11-04 DIAGNOSIS — D6481 Anemia due to antineoplastic chemotherapy: Secondary | ICD-10-CM | POA: Diagnosis not present

## 2022-11-04 DIAGNOSIS — C189 Malignant neoplasm of colon, unspecified: Secondary | ICD-10-CM | POA: Diagnosis not present

## 2022-11-04 DIAGNOSIS — T451X5A Adverse effect of antineoplastic and immunosuppressive drugs, initial encounter: Secondary | ICD-10-CM

## 2022-11-04 DIAGNOSIS — L249 Irritant contact dermatitis, unspecified cause: Secondary | ICD-10-CM

## 2022-11-04 DIAGNOSIS — Z5111 Encounter for antineoplastic chemotherapy: Secondary | ICD-10-CM

## 2022-11-04 LAB — CBC WITH DIFFERENTIAL/PLATELET
Abs Immature Granulocytes: 0.01 10*3/uL (ref 0.00–0.07)
Basophils Absolute: 0.1 10*3/uL (ref 0.0–0.1)
Basophils Relative: 1 %
Eosinophils Absolute: 0.3 10*3/uL (ref 0.0–0.5)
Eosinophils Relative: 4 %
HCT: 36.8 % — ABNORMAL LOW (ref 39.0–52.0)
Hemoglobin: 12.4 g/dL — ABNORMAL LOW (ref 13.0–17.0)
Immature Granulocytes: 0 %
Lymphocytes Relative: 36 %
Lymphs Abs: 2.2 10*3/uL (ref 0.7–4.0)
MCH: 31.3 pg (ref 26.0–34.0)
MCHC: 33.7 g/dL (ref 30.0–36.0)
MCV: 92.9 fL (ref 80.0–100.0)
Monocytes Absolute: 1 10*3/uL (ref 0.1–1.0)
Monocytes Relative: 16 %
Neutro Abs: 2.7 10*3/uL (ref 1.7–7.7)
Neutrophils Relative %: 43 %
Platelets: 122 10*3/uL — ABNORMAL LOW (ref 150–400)
RBC: 3.96 MIL/uL — ABNORMAL LOW (ref 4.22–5.81)
RDW: 23.7 % — ABNORMAL HIGH (ref 11.5–15.5)
WBC: 6.2 10*3/uL (ref 4.0–10.5)
nRBC: 0 % (ref 0.0–0.2)

## 2022-11-04 LAB — COMPREHENSIVE METABOLIC PANEL
ALT: 21 U/L (ref 0–44)
AST: 28 U/L (ref 15–41)
Albumin: 3.8 g/dL (ref 3.5–5.0)
Alkaline Phosphatase: 81 U/L (ref 38–126)
Anion gap: 6 (ref 5–15)
BUN: 21 mg/dL (ref 8–23)
CO2: 25 mmol/L (ref 22–32)
Calcium: 8.9 mg/dL (ref 8.9–10.3)
Chloride: 107 mmol/L (ref 98–111)
Creatinine, Ser: 1.02 mg/dL (ref 0.61–1.24)
GFR, Estimated: >60 mL/min (ref >60)
Glucose, Bld: 110 mg/dL — ABNORMAL HIGH (ref 70–99)
Potassium: 4 mmol/L (ref 3.5–5.1)
Sodium: 138 mmol/L (ref 135–145)
Total Bilirubin: 0.6 mg/dL (ref 0.3–1.2)
Total Protein: 6.7 g/dL (ref 6.5–8.1)

## 2022-11-04 MED ORDER — SODIUM CHLORIDE 0.9% FLUSH
10.0000 mL | INTRAVENOUS | Status: DC | PRN
  Administered 2022-11-04: 10 mL via INTRAVENOUS
  Filled 2022-11-04: qty 10

## 2022-11-04 MED ORDER — PALONOSETRON HCL INJECTION 0.25 MG/5ML
0.2500 mg | Freq: Once | INTRAVENOUS | Status: AC
  Administered 2022-11-04: 0.25 mg via INTRAVENOUS
  Filled 2022-11-04: qty 5

## 2022-11-04 MED ORDER — SODIUM CHLORIDE 0.9 % IV SOLN
10.0000 mg | Freq: Once | INTRAVENOUS | Status: AC
  Administered 2022-11-04: 10 mg via INTRAVENOUS
  Filled 2022-11-04: qty 10

## 2022-11-04 MED ORDER — LEUCOVORIN CALCIUM INJECTION 350 MG
400.0000 mg/m2 | Freq: Once | INTRAVENOUS | Status: AC
  Administered 2022-11-04: 752 mg via INTRAVENOUS
  Filled 2022-11-04: qty 37.6

## 2022-11-04 MED ORDER — LORATADINE 10 MG PO TABS
10.0000 mg | ORAL_TABLET | Freq: Every day | ORAL | Status: DC
  Administered 2022-11-04: 10 mg via ORAL
  Filled 2022-11-04: qty 1

## 2022-11-04 MED ORDER — OXALIPLATIN CHEMO INJECTION 100 MG/20ML
50.0000 mg/m2 | Freq: Once | INTRAVENOUS | Status: AC
  Administered 2022-11-04: 100 mg via INTRAVENOUS
  Filled 2022-11-04: qty 7.2

## 2022-11-04 MED ORDER — GABAPENTIN 100 MG PO CAPS: 200.0000 mg | ORAL_CAPSULE | Freq: Three times a day (TID) | ORAL | 0 refills | Status: DC

## 2022-11-04 MED ORDER — SODIUM CHLORIDE 0.9 % IV SOLN
2400.0000 mg/m2 | INTRAVENOUS | Status: DC
  Administered 2022-11-04: 5000 mg via INTRAVENOUS
  Filled 2022-11-04: qty 100

## 2022-11-04 MED ORDER — DEXTROSE 5 % IV SOLN
Freq: Once | INTRAVENOUS | Status: AC
  Filled 2022-11-04: qty 250

## 2022-11-04 MED ORDER — FLUOROURACIL CHEMO INJECTION 2.5 GM/50ML
400.0000 mg/m2 | Freq: Once | INTRAVENOUS | Status: AC
  Administered 2022-11-04: 750 mg via INTRAVENOUS
  Filled 2022-11-04: qty 15

## 2022-11-04 MED ORDER — HEPARIN SOD (PORK) LOCK FLUSH 100 UNIT/ML IV SOLN
500.0000 [IU] | Freq: Once | INTRAVENOUS | Status: DC
  Filled 2022-11-04: qty 5

## 2022-11-04 NOTE — Progress Notes (Signed)
Tolerated remainder of infusion without incident.

## 2022-11-04 NOTE — Patient Instructions (Signed)
[facility]  Discharge Instructions: Thank you for choosing [facility] to provide your oncology and hematology care.  If you have a lab appointment with the Cancer Center, please go directly to the Cancer Center and check in at the registration area.  Wear comfortable clothing and clothing appropriate for easy access to any Portacath or PICC line.   We strive to give you quality time with your provider. You may need to reschedule your appointment if you arrive late (15 or more minutes).  Arriving late affects you and other patients whose appointments are after yours.  Also, if you miss three or more appointments without notifying the office, you may be dismissed from the clinic at the provider's discretion.      For prescription refill requests, have your pharmacy contact our office and allow 72 hours for refills to be completed.    Today you received the following chemotherapy and/or immunotherapy agents- oxaliplatin, leucovorin, 5FU      To help prevent nausea and vomiting after your treatment, we encourage you to take your nausea medication as directed.  BELOW ARE SYMPTOMS THAT SHOULD BE REPORTED IMMEDIATELY: *FEVER GREATER THAN 100.4 F (38 C) OR HIGHER *CHILLS OR SWEATING *NAUSEA AND VOMITING THAT IS NOT CONTROLLED WITH YOUR NAUSEA MEDICATION *UNUSUAL SHORTNESS OF BREATH *UNUSUAL BRUISING OR BLEEDING *URINARY PROBLEMS (pain or burning when urinating, or frequent urination) *BOWEL PROBLEMS (unusual diarrhea, constipation, pain near the anus) TENDERNESS IN MOUTH AND THROAT WITH OR WITHOUT PRESENCE OF ULCERS (sore throat, sores in mouth, or a toothache) UNUSUAL RASH, SWELLING OR PAIN  UNUSUAL VAGINAL DISCHARGE OR ITCHING   Items with * indicate a potential emergency and should be followed up as soon as possible or go to the Emergency Department if any problems should occur.  Please show the CHEMOTHERAPY ALERT CARD or IMMUNOTHERAPY ALERT  CARD at check-in to the Emergency Department and triage nurse.  Should you have questions after your visit or need to cancel or reschedule your appointment, please contact [facility]  [phone] and follow the prompts.  Office hours are 8:00 a.m. to 4:30 p.m. Monday - Friday. Please note that voicemails left after 4:00 p.m. may not be returned until the following business day.  We are closed weekends and major holidays. You have access to a nurse at all times for urgent questions. Please call the main number to the clinic [phone] and follow the prompts.  For any non-urgent questions, you may also contact your provider using MyChart. We now offer e-Visits for anyone 18 and older to request care online for non-urgent symptoms. For details visit [email or web address].   Also download the MyChart app! Go to the app store, search "MyChart", open the app, select [facility], and log in with your MyChart username and password.    

## 2022-11-04 NOTE — Progress Notes (Signed)
[facility] CONSULT NOTE  Patient Care Team: [name], MD as PCP - General (Family Medicine) [name], RN as Oncology Nurse Navigator   CANCER STAGING   Cancer Staging  Cancer of transverse colon Staging form: Colon and Rectum, AJCC 8th Edition - Clinical: Stage IIIC (cT4b, cN1a, cM0) - Signed by [name], MD on 07/12/2022 Stage prefix: Initial diagnosis Total positive nodes: 1 Total nodes examined: 24   ASSESSMENT & PLAN:  [name] 72 y.o. male with pmh of colon cancer s/p left sided colostomy in 2011, chemotherapy, neuropathy follows with Medical Oncology for Stage IIIC colon cancer.   #Transverse colon adenocarcinoma, Stage IIIC ([id number]), MMR proficient - presented with bowel obstruction. S/p resection and reanastomosis with Dr. [name] on 12/13. CT imaging negative for metastasis.  -Oxaliplatin held with cycle 4 due to worsening cold induced neuropathy.  Neuropathy has improved -> resumed oxaliplatin 50 mg/m with cycle 6.  Notices some worsening of neuropathy again.  He is agreeable to do a trial of gabapentin 200 mg 3 times daily.  CBC and CMP reviewed and acceptable for treatment.  Proceed with cycle 7 -5-FU, leucovorin and oxaliplatin 50 mg/m today. Day 3 pump disconnect.    - I discussed with the patient that I would only be considering 3 months of oxaliplatin which will compete with his next cycle due to his worsening neuropathy. Study has not shown detrimental impact on DFS or OS in patients who received 3 months of oxaliplatin versus 6 months.  Please reassess for neuropathy with his next cycle and if it continues to get worse then permanently discontinue oxaliplatin and continue with single agent 5-FU for total 12 cycles.  Versus if it is stable then we can give him last cycle of oxaliplatin to complete 3 mo.  References- Prognostic Impact of Early Treatment and Oxaliplatin Discontinuation in Patients With Stage III Colon  Cancer: An ACCENT/IDEA Pooled Analysis of 11 Adjuvant Trials. In a combined analysis of 10,444 patients participating in one of 11 adjuvant chemotherapy trials, early oxaliplatin discontinuation after three months did not impair disease-free or overall survival compared with those who received the full six months of therapy, but there was a detrimental impact on disease-free survival for administration of <50 percent of planned oxaliplatin doses     -Patient has antiemetics-Zofran and Compazine as needed.  Using dexamethasone 4 mg for 2 days after chemotherapy. -CT chest abdomen pelvis for monitoring.  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  As above  # Insomnia -On melatonin 10 mg nightly.  Helping.  # Arthritis -Patient has multiple arthritic joints including shoulders or hips.  Worsened with chemotherapy -Takes ibuprofen in the morning and Norco at night.    # Iron deficiency anemia - s/p IV Venofer x 2 doses.  Continue with iron pills.  #History of colon cancer s/p left sided colostomy, chemo in 2011 -Surgery and had adjuvant chemotherapy.  No records are available.  # Access -port placed on 08/08/2022 by Dr. [name]  Orders Placed This Encounter  Procedures   CT CHEST ABDOMEN PELVIS W CONTRAST    In 2-3 weeks.    Standing Status:   Future    Standing Expiration Date:   11/04/2023    Scheduling Instructions:     In 2-3 weeks    Order Specific Question:   Preferred imaging location?    Answer:   [facility]    Order Specific Question:   Radiology  Contrast Protocol - do NOT remove file path    Answer:   [email or web address]   CBC with Differential    Standing Status:   Future    Standing Expiration Date:   11/18/2023   Comprehensive metabolic panel    Standing Status:   Future    Standing Expiration Date:   11/18/2023   CEA    Standing Status:   Future    Standing Expiration  Date:   11/04/2023   RTC in 2 weeks for APP, labs, cycle 8.  FOLFOX.  Pump disconnect on day 3.  The total time spent in the appointment was 30 minutes encounter with patients including review of chart and various tests results, discussions about plan of care and coordination of care plan   All questions were answered. The patient knows to call the clinic with any problems, questions or concerns. No barriers to learning was detected.  [name], MD 4/15/20242:23 PM   HISTORY OF PRESENTING ILLNESS:  [name] 72 y.o. male with pmh of colon cancer s/p left sided colostomy in 2011, chemotherapy, neuropathy follows with Medical Oncology for Stage IIIC colon cancer.   Interval history Patient in today prior to cycle 7 of FOLFOX.  He notices some worsening of his neuropathy after reintroducing oxaliplatin.  Has been feeling more tingling.  Denies any issues with buttoning of the shirt.  Does not drop objects.  He is taking melatonin 10 mg at night which gives him good solid sleep for about 5 hours.  Denies any abdominal pain, diarrhea.  Has mild nausea manageable with Zofran and Decadron.    I have reviewed his chart and materials related to his cancer extensively and collaborated history with the patient. Summary of oncologic history is as follows: Oncology History Overview Note  Patient has a history of colon cancer in 2011 treated with Dr. [name] with radiation followed by surgery s/p left sided colostomy and adjuvant chemo likely FOLFOX. Records unavailable.    Cancer of transverse colon  06/29/2022 Initial Diagnosis   Presented to ED for abdominal pain, nausea and vomiting.    Imaging   CT abdo pelvis IMPRESSION: 1. Patient is status post abdominoperineal resection with left lower quadrant ostomy. The ascending and transverse colon are moderately distended and there is abrupt caliber change at the distal transverse colon/splenic flexure where there is irregular  masslike thickening. Findings are suspicious for colonic obstruction due to a mass at this location. There is no dilated small bowel. 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 2. Slightly thick-walled presacral fluid collection with peripheral calcification, may reflect chronic postoperative collection. 3. Gallstones. 4. Aortic atherosclerosis.  CT chest IMPRESSION: 1. No acute intrathoracic process. No evidence of intrathoracic metastases.    Procedure   Colonoscopy on 06/29/22 by Dr. [name] A fungating partially obstructing mass in mid transverse colon.   07/03/2022 Pathology Results   DIAGNOSIS: A. 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 - SEE CANCER SUMMARY BELOW. - TATTOO INK. - SEPARATE 4.5 CM LENGTH OF SMALL INTESTINE.  CANCER CASE SUMMARY: COLON AND RECTUM Standard(s): AJCC-UICC 8  SPECIMEN Procedure: Transverse colectomy  TUMOR Tumor Site: Transverse colon Histologic Type: Adenocarcinoma Histologic Grade: G2, moderately differentiated Tumor Size: Greatest dimension: 6.1 cm Tumor Extent: Directly invades or adheres to adjacent structures (small intestine) Macroscopic Tumor Perforation: Not identified Lymphatic and/or Vascular Invasion: Large vessel (venous), extramural Perineural Invasion: Present Tumor Budding Score: High (  10 or more) Treatment Effect: No known presurgical therapy  MARGINS Margin Status for Invasive Carcinoma: All margins negative for invasive carcinoma      Margins examined: Proximal, distal, and mesenteric Margin Status for Non-Invasive Tumor: All margins negative for high-grade dysplasia/intramucosal carcinoma and low-grade dysplasia  REGIONAL LYMPH NODES Regional Lymph Nodes: Regional lymph nodes present      Tumor present in regional lymph node(s)           Number of Lymph Nodes with Tumor: 1       Number of Lymph Nodes Examined: 24  Tumor Deposits: Not identified  DISTANT METASTASES Distant Site(s) Involved, if applicable: Not applicable  PATHOLOGIC STAGE CLASSIFICATION (pTNM, AJCC 8th Edition): Modified Classification: Not applicable pT4b      T suffix: Not applicable pN1a pM not applicable - pM cannot be determined from the submitted specimen(s)    07/03/2022 Procedure   Exploratory laparotomy, en bloc colon resection, reanastomosis, primary repair of parastomal hernia by Dr. [name]     07/12/2022 Cancer Staging   Staging form: Colon and Rectum, AJCC 8th Edition - Clinical: Stage IIIC (cT4b, cN1a, cM0) - Signed by [name], MD on 07/12/2022 Stage prefix: Initial diagnosis Total positive nodes: 1 Total nodes examined: 24   08/12/2022 -  Chemotherapy   Patient is on Treatment Plan : COLORECTAL FOLFOX q14d x 6 months       MEDICAL HISTORY:  Past Medical History:  Diagnosis Date   Colon cancer    remission 2011   Heart murmur    as a child from rheumatic fever   Malignant neoplasm of transverse colon 2024   Microcytic anemia    Neuropathy    Presence of colostomy    Protein-calorie malnutrition, severe     SURGICAL HISTORY: Past Surgical History:  Procedure Laterality Date   APPENDECTOMY     COLECTOMY WITH COLOSTOMY CREATION/HARTMANN PROCEDURE N/A 07/03/2022   Procedure: COLECTOMY WITH COLOSTOMY CREATION/HARTMANN PROCEDURE;  Surgeon: Sung Amabile, DO;  Location: [facility] ORS;  Service: General;  Laterality: N/A;   COLONOSCOPY N/A 06/29/2022   Procedure: COLONOSCOPY;  Surgeon: Sung Amabile, DO;  Location: [facility] ENDOSCOPY;  Service: General;  Laterality: N/A;   PORTACATH PLACEMENT Right 08/08/2022   Procedure: INSERTION PORT-A-CATH;  Surgeon: Sung Amabile, DO;  Location: [facility] ORS;  Service: General;  Laterality: Right;    SOCIAL HISTORY: Social History   Socioeconomic History   Marital status: Single    Spouse name: Not on file   Number of children:  Not on file   Years of education: Not on file   Highest education level: Not on file  Occupational History   Not on file  Tobacco Use   Smoking status: Never   Smokeless tobacco: Never  Vaping Use   Vaping Use: Never used  Substance and Sexual Activity   Alcohol use: Not Currently   Drug use: Never   Sexual activity: Not on file  Other Topics Concern   Not on file  Social History Narrative   Not on file   Social Determinants of Health   Financial Resource Strain: Not on file  Food Insecurity: Unknown (06/29/2022)   Hunger Vital Sign    Worried About Running Out of Food in the Last Year: Never true    Ran Out of Food in the Last Year: Not on file  Transportation Needs: No Transportation Needs (06/29/2022)   PRAPARE - Administrator, Civil Service (Medical): No    Lack of Transportation (Non-Medical):  No  Physical Activity: Not on file  Stress: Not on file  Social Connections: Not on file  Intimate Partner Violence: Not At Risk (06/29/2022)   Humiliation, Afraid, Rape, and Kick questionnaire    Fear of Current or Ex-Partner: No    Emotionally Abused: No    Physically Abused: No    Sexually Abused: No    FAMILY HISTORY: No family history on file.  ALLERGIES:  has No Known Allergies.  MEDICATIONS:  Current Outpatient Medications  Medication Sig Dispense Refill   gabapentin (NEURONTIN) 100 MG capsule Take 2 capsules (200 mg total) by mouth 3 (three) times daily. 180 capsule 0   dexamethasone (DECADRON) 4 MG tablet Take 1 tablet (4 mg total) by mouth daily. Start the day after chemotherapy for 2 days. Take with food. 30 tablet 1   Docusate Sodium (DSS) 100 MG CAPS Take 1 capsule by mouth 2 (two) times daily. As needed for mild constipation.     Ferrous Sulfate (IRON PO) Take 1 tablet by mouth 3 (three) times a week.     HYDROcodone-acetaminophen (NORCO) 5-325 MG tablet Take 1 tablet by mouth every 6 (six) hours as needed for up to 30 doses for moderate pain. 30  tablet 0   ibuprofen (ADVIL) 800 MG tablet 800 mg.     lidocaine-prilocaine (EMLA) cream Apply to affected area once 30 g 3   melatonin 5 MG TABS Take 1 tablet (5 mg total) by mouth at bedtime as needed. 30 tablet 0   Multiple Vitamin (MULTIVITAMIN) tablet Take 1 tablet by mouth daily.     ondansetron (ZOFRAN) 8 MG tablet Take 1 tablet (8 mg total) by mouth every 8 (eight) hours as needed for nausea or vomiting. Start on the third day after chemotherapy. 30 tablet 1   potassium chloride SA (KLOR-CON M) 20 MEQ tablet Take 1 tablet (20 mEq total) by mouth daily for 3 days. 3 tablet 0   prochlorperazine (COMPAZINE) 10 MG tablet Take 1 tablet (10 mg total) by mouth every 6 (six) hours as needed for nausea or vomiting. 30 tablet 1   No current facility-administered medications for this visit.   Facility-Administered Medications Ordered in Other Visits  Medication Dose Route Frequency Provider Last Rate Last Admin   fluorouracil (ADRUCIL) 5,000 mg in sodium chloride 0.9 % 150 mL chemo infusion  2,400 mg/m2 (Treatment Plan Recorded) Intravenous 1 day or 1 dose [name], MD       fluorouracil (ADRUCIL) chemo injection 750 mg  400 mg/m2 (Treatment Plan Recorded) Intravenous Once [name], MD       loratadine (CLARITIN) tablet 10 mg  10 mg Oral Daily Alinda Dooms, NP   10 mg at 11/04/22 1255    REVIEW OF SYSTEMS:   Pertinent information mentioned in HPI All other systems were reviewed with the patient and are negative.  PHYSICAL EXAMINATION: ECOG PERFORMANCE STATUS: 1 - Symptomatic but completely ambulatory  Vitals:   11/04/22 0900  BP: (!) 150/90  Pulse: 63  Resp: 18  Temp: 97.9 F (36.6 C)    Filed Weights   11/04/22 0900  Weight: 176 lb 6.4 oz (80 kg)     GENERAL:alert, no distress and comfortable SKIN: skin color, texture, turgor are normal, no rashes or significant lesions EYES: normal, conjunctiva are pink and non-injected, sclera clear OROPHARYNX:no exudate,  no erythema and lips, buccal mucosa, and tongue normal  NECK: supple, thyroid normal size, non-tender, without nodularity LYMPH:  no palpable lymphadenopathy in the  cervical, axillary or inguinal LUNGS: clear to auscultation and percussion with normal breathing effort HEART: regular rate & rhythm and no murmurs and no lower extremity edema ABDOMEN:abdomen soft, non-tender and normal bowel sounds Musculoskeletal:no cyanosis of digits and no clubbing  PSYCH: alert & oriented x 3 with fluent speech NEURO: no focal motor/sensory deficits  LABORATORY DATA:  I have reviewed the data as listed Lab Results  Component Value Date   WBC 6.2 11/04/2022   HGB 12.4 (L) 11/04/2022   HCT 36.8 (L) 11/04/2022   MCV 92.9 11/04/2022   PLT 122 (L) 11/04/2022   Recent Labs    10/07/22 0845 10/21/22 0913 11/04/22 0859  NA 139 139 138  K 3.7 3.3* 4.0  CL 106 109 107  CO2 23 25 25   GLUCOSE 139* 81 110*  BUN 17 23 21   CREATININE 1.04 1.04 1.02  CALCIUM 9.2 8.9 8.9  GFRNONAA >60 >60 >60  PROT 7.2 6.4* 6.7  ALBUMIN 4.1 3.9 3.8  AST 35 21 28  ALT 24 15 21   ALKPHOS 69 85 81  BILITOT 0.8 0.6 0.6    RADIOGRAPHIC STUDIES: I have personally reviewed the radiological images as listed and agreed with the findings in the report. No results found.

## 2022-11-04 NOTE — Progress Notes (Signed)
[facility]  [facility] A Department of the [place]. [facility] [address], [address] [place], [place] [address] [phone] (phone) [phone] (fax)  Patient Care Team: [name], MD as PCP - General (Family Medicine) [name], RN as Oncology Nurse Navigator   Name of the patient: [name]  [id number]  [birth year]   Date of visit: 11/04/22  Diagnosis- Colon Cancer  Chief complaint/ Reason for visit- allergic reaction  Heme/Onc history:  Oncology History Overview Note  Patient has a history of colon cancer in 2011 treated with Dr. [name] with radiation followed by surgery s/p left sided colostomy and adjuvant chemo likely FOLFOX. Records unavailable.    Cancer of transverse colon  06/29/2022 Initial Diagnosis   Presented to ED for abdominal pain, nausea and vomiting.    Imaging   CT abdo pelvis IMPRESSION: 1. Patient is status post abdominoperineal resection with left lower quadrant ostomy. The ascending and transverse colon are moderately distended and there is abrupt caliber change at the distal transverse colon/splenic flexure where there is irregular masslike thickening. Findings are suspicious for colonic obstruction due to a mass at this location. There is no dilated small bowel. 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 2. Slightly thick-walled presacral fluid collection with peripheral calcification, may reflect chronic postoperative collection. 3. Gallstones. 4. Aortic atherosclerosis.  CT chest IMPRESSION: 1. No acute intrathoracic process. No evidence of intrathoracic metastases.    Procedure   Colonoscopy on 06/29/22 by Dr. [name] A fungating partially obstructing mass in mid transverse colon.   07/03/2022 Pathology Results   DIAGNOSIS: A. COLON, TRANSVERSE; RESECTION: - INVASIVE COLORECTAL  ADENOCARCINOMA WITH DIRECT EXTENSION INTO SMALL INTESTINE. - SEE CANCER SUMMARY BELOW. - TATTOO INK. - SEPARATE 4.5 CM LENGTH OF SMALL INTESTINE.  CANCER CASE SUMMARY: COLON AND RECTUM Standard(s): AJCC-UICC 8  SPECIMEN Procedure: Transverse colectomy  TUMOR Tumor Site: Transverse colon Histologic Type: Adenocarcinoma Histologic Grade: G2, moderately differentiated Tumor Size: Greatest dimension: 6.1 cm Tumor Extent: Directly invades or adheres to adjacent structures (small intestine) Macroscopic Tumor Perforation: Not identified Lymphatic and/or Vascular Invasion: Large vessel (venous), extramural Perineural Invasion: Present Tumor Budding Score: High (10 or more) Treatment Effect: No known presurgical therapy  MARGINS Margin Status for Invasive Carcinoma: All margins negative for invasive carcinoma      Margins examined: Proximal, distal, and mesenteric Margin Status for Non-Invasive Tumor: All margins negative for high-grade dysplasia/intramucosal carcinoma and low-grade dysplasia  REGIONAL LYMPH NODES Regional Lymph Nodes: Regional lymph nodes present      Tumor present in regional lymph node(s)           Number of Lymph Nodes with Tumor: 1      Number of Lymph Nodes Examined: 24  Tumor Deposits: Not identified  DISTANT METASTASES Distant Site(s) Involved, if applicable: Not applicable  PATHOLOGIC STAGE CLASSIFICATION (pTNM, AJCC 8th Edition): Modified Classification: Not applicable pT4b      T suffix: Not applicable pN1a pM not applicable - pM cannot be determined from the submitted specimen(s)    07/03/2022 Procedure   Exploratory laparotomy, en bloc colon resection, reanastomosis, primary repair of parastomal hernia by Dr. [name]     07/12/2022 Cancer Staging   Staging form: Colon and Rectum, AJCC 8th Edition - Clinical: Stage IIIC (cT4b, cN1a, cM0) - Signed by [name], MD on 07/12/2022 Stage prefix: Initial diagnosis Total positive nodes:  1 Total nodes examined:  24   08/12/2022 -  Chemotherapy   Patient is on Treatment Plan : COLORECTAL FOLFOX q14d x 6 months       Interval history- Called to infusion to evaluate patient who is currently receiving infusion of leucovorin and oxaliplatin chemotherapy. Patient complained of rash and itching of his lower arms and neck which had been covered by a warm blanket during his treatment. Upon removal, nursing and patient noted redness and pruritic spots. He otherwise felt we.. No shortness of breath, no rash on other parts of the body. No chest pain.    Review of systems- Review of Systems  HENT:  Negative for congestion and sore throat.   Respiratory:  Negative for cough, shortness of breath, wheezing and stridor.   Cardiovascular:  Negative for chest pain and palpitations.  Gastrointestinal:  Negative for abdominal pain and nausea.  Skin:  Positive for itching and rash.  Neurological:  Negative for tingling, sensory change, speech change and weakness.     No Known Allergies  Past Medical History:  Diagnosis Date   Colon cancer    remission 2011   Heart murmur    as a child from rheumatic fever   Malignant neoplasm of transverse colon 2024   Microcytic anemia    Neuropathy    Presence of colostomy    Protein-calorie malnutrition, severe     Past Surgical History:  Procedure Laterality Date   APPENDECTOMY     COLECTOMY WITH COLOSTOMY CREATION/HARTMANN PROCEDURE N/A 07/03/2022   Procedure: COLECTOMY WITH COLOSTOMY CREATION/HARTMANN PROCEDURE;  Surgeon: Sung Amabile, DO;  Location: [facility] ORS;  Service: General;  Laterality: N/A;   COLONOSCOPY N/A 06/29/2022   Procedure: COLONOSCOPY;  Surgeon: Sung Amabile, DO;  Location: [facility] ENDOSCOPY;  Service: General;  Laterality: N/A;   PORTACATH PLACEMENT Right 08/08/2022   Procedure: INSERTION PORT-A-CATH;  Surgeon: Sung Amabile, DO;  Location: [facility] ORS;  Service: General;  Laterality: Right;    Current Outpatient Medications:     dexamethasone (DECADRON) 4 MG tablet, Take 1 tablet (4 mg total) by mouth daily. Start the day after chemotherapy for 2 days. Take with food., Disp: 30 tablet, Rfl: 1   Docusate Sodium (DSS) 100 MG CAPS, Take 1 capsule by mouth 2 (two) times daily. As needed for mild constipation., Disp: , Rfl:    Ferrous Sulfate (IRON PO), Take 1 tablet by mouth 3 (three) times a week., Disp: , Rfl:    gabapentin (NEURONTIN) 100 MG capsule, Take 2 capsules (200 mg total) by mouth 3 (three) times daily., Disp: 180 capsule, Rfl: 0   HYDROcodone-acetaminophen (NORCO) 5-325 MG tablet, Take 1 tablet by mouth every 6 (six) hours as needed for up to 30 doses for moderate pain., Disp: 30 tablet, Rfl: 0   ibuprofen (ADVIL) 800 MG tablet, 800 mg., Disp: , Rfl:    lidocaine-prilocaine (EMLA) cream, Apply to affected area once, Disp: 30 g, Rfl: 3   melatonin 5 MG TABS, Take 1 tablet (5 mg total) by mouth at bedtime as needed., Disp: 30 tablet, Rfl: 0   Multiple Vitamin (MULTIVITAMIN) tablet, Take 1 tablet by mouth daily., Disp: , Rfl:    ondansetron (ZOFRAN) 8 MG tablet, Take 1 tablet (8 mg total) by mouth every 8 (eight) hours as needed for nausea or vomiting. Start on the third day after chemotherapy., Disp: 30 tablet, Rfl: 1   potassium chloride SA (KLOR-CON M) 20 MEQ tablet, Take 1 tablet (20 mEq total) by mouth daily for 3 days., Disp: 3 tablet, Rfl:  0   prochlorperazine (COMPAZINE) 10 MG tablet, Take 1 tablet (10 mg total) by mouth every 6 (six) hours as needed for nausea or vomiting., Disp: 30 tablet, Rfl: 1 No current facility-administered medications for this visit.  Facility-Administered Medications Ordered in Other Visits:    fluorouracil (ADRUCIL) 5,000 mg in sodium chloride 0.9 % 150 mL chemo infusion, 2,400 mg/m2 (Treatment Plan Recorded), Intravenous, 1 day or 1 dose, [name], MD   fluorouracil (ADRUCIL) chemo injection 750 mg, 400 mg/m2 (Treatment Plan Recorded), Intravenous, Once, [name], MD    loratadine (CLARITIN) tablet 10 mg, 10 mg, Oral, Daily, Alinda Dooms, NP, 10 mg at 11/04/22 1255  Physical exam: There were no vitals filed for this visit. Physical Exam Constitutional:      Appearance: He is not ill-appearing.  Skin:    Findings: Lesion and rash present.     Comments: Pruritic appearing erythematous lesions on lower arms bilaterally and front of neck above his shirt collar. No visible lesions elsewhere.   Neurological:     Mental Status: He is alert and oriented to person, place, and time.  Psychiatric:        Mood and Affect: Mood normal.        Behavior: Behavior normal.       Assessment and plan- Patient is a 72 y.o. male currently receiving leucovorin- oxaliplatin chemotherapy who presents for concerns of allergic reaction to contact irritant (blanket/detergent) vs medication. Etiology unclea rbut patient was generally well appearing and localized symptoms (arms and neck). Findings inconsistent with chemo reaction. Chemo held. Gave patient claritin tablet by mouth. Within 30 minutes symptoms had resolved. After SDM patient elected to rechallenge chemotherapy. Tolerated well without recurrent symptoms. I updated Dr. Alena Bills but no recommendation for treatment plan changes today.   Follow up as needed.    Visit Diagnosis 1. Irritant contact dermatitis, unspecified trigger    Patient expressed understanding and was in agreement with this plan. He also understands that He can call clinic at any time with any questions, concerns, or complaints.   Thank you for allowing me to participate in the care of this very pleasant patient.   Consuello Masse, DNP, AGNP-C, AOCNP [facility] [phone]

## 2022-11-04 NOTE — Progress Notes (Signed)
At 1240, pt reported red, itchy, flat rash to bilateral forearms and upper neck area. Notified MD. 1245- Consuello Masse, NP at chairside. Oxaliplatin/leucovorin stopped. Claritin given. 1330- rash resolved. Vitals stable. Pt strongly suspects the blanket used over his upper body caused the rash, and he is eager to restart infusion. NP notified with update. States to restart infusion and to monitor for further symptoms. 1334- infusion restarted.

## 2022-11-05 ENCOUNTER — Other Ambulatory Visit: Payer: Self-pay

## 2022-11-06 ENCOUNTER — Inpatient Hospital Stay: Payer: PPO

## 2022-11-06 VITALS — BP 140/65 | HR 66 | Resp 18

## 2022-11-06 DIAGNOSIS — Z5111 Encounter for antineoplastic chemotherapy: Secondary | ICD-10-CM | POA: Diagnosis not present

## 2022-11-06 DIAGNOSIS — C184 Malignant neoplasm of transverse colon: Secondary | ICD-10-CM

## 2022-11-06 MED ORDER — HEPARIN SOD (PORK) LOCK FLUSH 100 UNIT/ML IV SOLN
500.0000 [IU] | Freq: Once | INTRAVENOUS | Status: AC | PRN
  Administered 2022-11-06: 500 [IU]
  Filled 2022-11-06: qty 5

## 2022-11-06 MED ORDER — SODIUM CHLORIDE 0.9% FLUSH
10.0000 mL | INTRAVENOUS | Status: DC | PRN
  Administered 2022-11-06: 10 mL
  Filled 2022-11-06: qty 10

## 2022-11-11 DIAGNOSIS — C189 Malignant neoplasm of colon, unspecified: Secondary | ICD-10-CM | POA: Diagnosis not present

## 2022-11-12 ENCOUNTER — Ambulatory Visit
Admission: RE | Admit: 2022-11-12 | Discharge: 2022-11-12 | Disposition: A | Discharge status: Home / Self Care | Payer: PPO | Source: Ambulatory Visit | Attending: Internal Medicine | Admitting: Internal Medicine

## 2022-11-12 DIAGNOSIS — C184 Malignant neoplasm of transverse colon: Secondary | ICD-10-CM | POA: Insufficient documentation

## 2022-11-12 DIAGNOSIS — C189 Malignant neoplasm of colon, unspecified: Secondary | ICD-10-CM | POA: Diagnosis not present

## 2022-11-12 DIAGNOSIS — N200 Calculus of kidney: Secondary | ICD-10-CM | POA: Diagnosis not present

## 2022-11-12 DIAGNOSIS — K8020 Calculus of gallbladder without cholecystitis without obstruction: Secondary | ICD-10-CM | POA: Diagnosis not present

## 2022-11-12 MED ORDER — HEPARIN SOD (PORK) LOCK FLUSH 100 UNIT/ML IV SOLN
INTRAVENOUS | Status: AC
  Filled 2022-11-12: qty 5

## 2022-11-12 MED ORDER — IOHEXOL 300 MG/ML  SOLN
100.0000 mL | Freq: Once | INTRAMUSCULAR | Status: AC | PRN
  Administered 2022-11-12: 100 mL via INTRAVENOUS

## 2022-11-15 MED FILL — Dexamethasone Sodium Phosphate Inj 100 MG/10ML: INTRAMUSCULAR | Qty: 1 | Status: AC

## 2022-11-18 ENCOUNTER — Encounter: Payer: Self-pay | Admitting: Internal Medicine

## 2022-11-18 ENCOUNTER — Inpatient Hospital Stay: Payer: PPO

## 2022-11-18 ENCOUNTER — Inpatient Hospital Stay (HOSPITAL_BASED_OUTPATIENT_CLINIC_OR_DEPARTMENT_OTHER): Payer: PPO | Admitting: Nurse Practitioner

## 2022-11-18 ENCOUNTER — Encounter: Payer: Self-pay | Admitting: Nurse Practitioner

## 2022-11-18 VITALS — BP 169/93 | HR 72 | Temp 97.6°F | Wt 176.6 lb

## 2022-11-18 VITALS — BP 142/87 | HR 60 | Resp 18

## 2022-11-18 DIAGNOSIS — G620 Drug-induced polyneuropathy: Secondary | ICD-10-CM

## 2022-11-18 DIAGNOSIS — R110 Nausea: Secondary | ICD-10-CM

## 2022-11-18 DIAGNOSIS — C184 Malignant neoplasm of transverse colon: Secondary | ICD-10-CM

## 2022-11-18 DIAGNOSIS — T451X5A Adverse effect of antineoplastic and immunosuppressive drugs, initial encounter: Secondary | ICD-10-CM

## 2022-11-18 DIAGNOSIS — Z5111 Encounter for antineoplastic chemotherapy: Secondary | ICD-10-CM

## 2022-11-18 LAB — CBC WITH DIFFERENTIAL/PLATELET
Abs Immature Granulocytes: 0.02 10*3/uL (ref 0.00–0.07)
Basophils Absolute: 0.1 10*3/uL (ref 0.0–0.1)
Basophils Relative: 2 %
Eosinophils Absolute: 0.2 10*3/uL (ref 0.0–0.5)
Eosinophils Relative: 3 %
HCT: 33.9 % — ABNORMAL LOW (ref 39.0–52.0)
Hemoglobin: 11.7 g/dL — ABNORMAL LOW (ref 13.0–17.0)
Immature Granulocytes: 0 %
Lymphocytes Relative: 36 %
Lymphs Abs: 2.1 10*3/uL (ref 0.7–4.0)
MCH: 32.4 pg (ref 26.0–34.0)
MCHC: 34.5 g/dL (ref 30.0–36.0)
MCV: 93.9 fL (ref 80.0–100.0)
Monocytes Absolute: 0.9 10*3/uL (ref 0.1–1.0)
Monocytes Relative: 17 %
Neutro Abs: 2.4 10*3/uL (ref 1.7–7.7)
Neutrophils Relative %: 42 %
Platelets: 125 10*3/uL — ABNORMAL LOW (ref 150–400)
RBC: 3.61 MIL/uL — ABNORMAL LOW (ref 4.22–5.81)
RDW: 22.4 % — ABNORMAL HIGH (ref 11.5–15.5)
WBC: 5.7 10*3/uL (ref 4.0–10.5)
nRBC: 0 % (ref 0.0–0.2)

## 2022-11-18 LAB — COMPREHENSIVE METABOLIC PANEL
ALT: 24 U/L (ref 0–44)
AST: 32 U/L (ref 15–41)
Albumin: 3.7 g/dL (ref 3.5–5.0)
Alkaline Phosphatase: 80 U/L (ref 38–126)
Anion gap: 5 (ref 5–15)
BUN: 19 mg/dL (ref 8–23)
CO2: 23 mmol/L (ref 22–32)
Calcium: 8.7 mg/dL — ABNORMAL LOW (ref 8.9–10.3)
Chloride: 108 mmol/L (ref 98–111)
Creatinine, Ser: 0.96 mg/dL (ref 0.61–1.24)
GFR, Estimated: >60 mL/min (ref >60)
Glucose, Bld: 109 mg/dL — ABNORMAL HIGH (ref 70–99)
Potassium: 3.6 mmol/L (ref 3.5–5.1)
Sodium: 136 mmol/L (ref 135–145)
Total Bilirubin: 0.5 mg/dL (ref 0.3–1.2)
Total Protein: 6.6 g/dL (ref 6.5–8.1)

## 2022-11-18 MED ORDER — SODIUM CHLORIDE 0.9 % IV SOLN
10.0000 mg | Freq: Once | INTRAVENOUS | Status: AC
  Administered 2022-11-18: 10 mg via INTRAVENOUS
  Filled 2022-11-18: qty 10

## 2022-11-18 MED ORDER — LEUCOVORIN CALCIUM INJECTION 350 MG
400.0000 mg/m2 | Freq: Once | INTRAVENOUS | Status: AC
  Administered 2022-11-18: 752 mg via INTRAVENOUS
  Filled 2022-11-18: qty 37.6

## 2022-11-18 MED ORDER — SODIUM CHLORIDE 0.9 % IV SOLN
2400.0000 mg/m2 | INTRAVENOUS | Status: DC
  Administered 2022-11-18: 5000 mg via INTRAVENOUS
  Filled 2022-11-18: qty 100

## 2022-11-18 MED ORDER — DEXTROSE 5 % IV SOLN
Freq: Once | INTRAVENOUS | Status: AC
  Filled 2022-11-18: qty 250

## 2022-11-18 MED ORDER — PALONOSETRON HCL INJECTION 0.25 MG/5ML: 0.2500 mg | Freq: Once | INTRAVENOUS | Status: DC

## 2022-11-18 MED ORDER — FLUOROURACIL CHEMO INJECTION 2.5 GM/50ML
400.0000 mg/m2 | Freq: Once | INTRAVENOUS | Status: AC
  Administered 2022-11-18: 750 mg via INTRAVENOUS
  Filled 2022-11-18: qty 15

## 2022-11-18 NOTE — Progress Notes (Signed)
[facility] CONSULT NOTE  Patient Care Team: [name], MD as PCP - General (Family Medicine) [name], RN as Oncology Nurse Navigator   CANCER STAGING   Cancer Staging  Cancer of transverse colon [facility]) Staging form: Colon and Rectum, AJCC 8th Edition - Clinical: Stage IIIC (cT4b, cN1a, cM0) - Signed by [name], MD on 07/12/2022 Stage prefix: Initial diagnosis Total positive nodes: 1 Total nodes examined: 24   ASSESSMENT & PLAN:  [name] 72 y.o. male with   Stage IIIC transverse colon adenocarcinoma- pT4bN1aM0, MMR proficient. Presented with bowel obstruction, s/p resection and reanastomosis with Dr [name] on 07/03/22. CT negative for metastatic disease. 11/12/22 CT C/A/P to evaluate the response was stable, restaging. No evidence of progressive disease. Labs reviewed and acceptable for continuation of treatment. D/t neuropathy, discontinue oxaliplatin. Will proceed with leucovorin and 5-FU, bolus and pump. D/c aloxi. Updated treatment plan and chair times. He'll rtc on D3 for pump off. Plan for total of 12 cycles.  Colon Cancer- s/p left sided colostomy in 2011, chemotherapy.  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 Oxaliplatin was held with cycle 4 d/t worsening cold induced neuropathy. Neuropathy improved and he resumed oxaliplatin 50 mg/m2 with cycle 6. Started gabapentin 200 mg TID. Received dose reduced oxaliplatin with cycle 7. He's nearly completed recommended 3 months of oxaliplatin, however, given worsening neuropathy, plan to discontinue. Study has not shown detrimental impact on DFS or OS in patients who received 3 months of oxaliplatin vs 6 months. Today, neuropathy is worse, therefore, we will discontinue oxaliplatin. Offered non-medication options for management including PT, OT, acupuncture, neurology consult but patient feels symptoms are stable and tolerable. He wishes to hold off on  additional treatments for now.  Chemotherapy induced nausea- well controlled. Continue zofran and compazine as needed. Continue dexamethasone 4 mg for 2 days after chemotherapy.  Iron deficiency anemia- s/p IV venofer x 2 doses. Continue iron pills 3 times a week.  Insomnia- on melatonin 10 mg nightly. Improved.  Arthritis- multiple joints including shoulders and hips. Worse with chemo. Takes ibuprofen in morning and norco at night.  Hypertension- BP elevated. Improved on recheck. Monitor.  Port-a-cath- port placed on 08/08/22 by Dr. [name]. Functioning well.   Disposition: Treatment today F/u per IS- la   No orders of the defined types were placed in this encounter.  The total time spent in the appointment was 30 minutes encounter with patients including review of chart and various tests results, discussions about plan of care and coordination of care plan  All questions were answered. The patient knows to call the clinic with any problems, questions or concerns. No barriers to learning was detected.  Alinda Dooms, NP 11/18/2022   HISTORY OF PRESENTING ILLNESS:  [name] 72 y.o. male with pmh of colon cancer s/p left sided colostomy in 2011, chemotherapy, neuropathy follows with Medical Oncology for Stage IIIC colon cancer.   I have reviewed his chart and materials related to his cancer extensively and collaborated history with the patient. Summary of oncologic history is as follows: Oncology History Overview Note  Patient has a history of colon cancer in 2011 treated with Dr. [name] with radiation followed by surgery s/p left sided colostomy and adjuvant chemo likely FOLFOX. Records unavailable.    Cancer of transverse colon (HCC)  06/29/2022 Initial Diagnosis   Presented to ED for abdominal pain, nausea and vomiting.    Imaging   CT abdo  pelvis IMPRESSION: 1. Patient is status post abdominoperineal resection with left lower quadrant ostomy. The ascending and transverse  colon are moderately distended and there is abrupt caliber change at the distal transverse colon/splenic flexure where there is irregular masslike thickening. Findings are suspicious for colonic obstruction due to a mass at this location. There is no dilated small bowel. 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 2. Slightly thick-walled presacral fluid collection with peripheral calcification, may reflect chronic postoperative collection. 3. Gallstones. 4. Aortic atherosclerosis.  CT chest IMPRESSION: 1. No acute intrathoracic process. No evidence of intrathoracic metastases.    Procedure   Colonoscopy on 06/29/22 by Dr. [name] A fungating partially obstructing mass in mid transverse colon.   07/03/2022 Pathology Results   DIAGNOSIS: A. 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 - SEE CANCER SUMMARY BELOW. - TATTOO INK. - SEPARATE 4.5 CM LENGTH OF SMALL INTESTINE.  CANCER CASE SUMMARY: COLON AND RECTUM Standard(s): AJCC-UICC 8  SPECIMEN Procedure: Transverse colectomy  TUMOR Tumor Site: Transverse colon Histologic Type: Adenocarcinoma Histologic Grade: G2, moderately differentiated Tumor Size: Greatest dimension: 6.1 cm Tumor Extent: Directly invades or adheres to adjacent structures (small intestine) Macroscopic Tumor Perforation: Not identified Lymphatic and/or Vascular Invasion: Large vessel (venous), extramural Perineural Invasion: Present Tumor Budding Score: High (10 or more) Treatment Effect: No known presurgical therapy  MARGINS Margin Status for Invasive Carcinoma: All margins negative for invasive carcinoma      Margins examined: Proximal, distal, and mesenteric Margin Status for Non-Invasive Tumor: All margins negative for high-grade dysplasia/intramucosal carcinoma and low-grade dysplasia  REGIONAL LYMPH NODES Regional  Lymph Nodes: Regional lymph nodes present      Tumor present in regional lymph node(s)           Number of Lymph Nodes with Tumor: 1      Number of Lymph Nodes Examined: 24  Tumor Deposits: Not identified  DISTANT METASTASES Distant Site(s) Involved, if applicable: Not applicable  PATHOLOGIC STAGE CLASSIFICATION (pTNM, AJCC 8th Edition): Modified Classification: Not applicable pT4b      T suffix: Not applicable pN1a pM not applicable - pM cannot be determined from the submitted specimen(s)    07/03/2022 Procedure   Exploratory laparotomy, en bloc colon resection, reanastomosis, primary repair of parastomal hernia by Dr. [name]     07/12/2022 Cancer Staging   Staging form: Colon and Rectum, AJCC 8th Edition - Clinical: Stage IIIC (cT4b, cN1a, cM0) - Signed by [name], MD on 07/12/2022 Stage prefix: Initial diagnosis Total positive nodes: 1 Total nodes examined: 24   08/12/2022 -  Chemotherapy   Patient is on Treatment Plan : COLORECTAL FOLFOX q14d x 6 months       Interval history Patient in today prior to cycle 8 of FOLFOX chemotherapy. Neuropathy worsened after dose reduced oxaliplatin. He would like to discontinue. No tripping or falls. Sleep has improved. No abdominal pain, diarrhea. Nausea is well controlled with zofran.    MEDICAL HISTORY:  Past Medical History:  Diagnosis Date   Colon cancer (HCC)    remission 2011   Heart murmur    as a child from rheumatic fever   Malignant neoplasm of transverse colon (HCC) 2024   Microcytic anemia    Neuropathy    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HCC)    Protein-calorie malnutrition, severe (HCC)     SURGICAL HISTORY: Past Surgical History:  Procedure Laterality Date   APPENDECTOMY  COLECTOMY WITH COLOSTOMY CREATION/HARTMANN PROCEDURE N/A 07/03/2022   Procedure: COLECTOMY WITH COLOSTOMY CREATION/HARTMANN PROCEDURE;  Surgeon: Sung Amabile, DO;  Location: [facility] ORS;  Service: General;  Laterality: N/A;   COLONOSCOPY  N/A 06/29/2022   Procedure: COLONOSCOPY;  Surgeon: Sung Amabile, DO;  Location: [facility] ENDOSCOPY;  Service: General;  Laterality: N/A;   PORTACATH PLACEMENT Right 08/08/2022   Procedure: INSERTION PORT-A-CATH;  Surgeon: Sung Amabile, DO;  Location: [facility] ORS;  Service: General;  Laterality: Right;    SOCIAL HISTORY: Social History   Socioeconomic History   Marital status: Single    Spouse name: Not on file   Number of children: Not on file   Years of education: Not on file   Highest education level: Not on file  Occupational History   Not on file  Tobacco Use   Smoking status: Never   Smokeless tobacco: Never  Vaping Use   Vaping Use: Never used  Substance and Sexual Activity   Alcohol use: Not Currently   Drug use: Never   Sexual activity: Not on file  Other Topics Concern   Not on file  Social History Narrative   Not on file   Social Determinants of Health   Financial Resource Strain: Not on file  Food Insecurity: Unknown (06/29/2022)   Hunger Vital Sign    Worried About Running Out of Food in the Last Year: Never true    Ran Out of Food in the Last Year: Not on file  Transportation Needs: No Transportation Needs (06/29/2022)   PRAPARE - Administrator, Civil Service (Medical): No    Lack of Transportation (Non-Medical): No  Physical Activity: Not on file  Stress: Not on file  Social Connections: Not on file  Intimate Partner Violence: Not At Risk (06/29/2022)   Humiliation, Afraid, Rape, and Kick questionnaire    Fear of Current or Ex-Partner: No    Emotionally Abused: No    Physically Abused: No    Sexually Abused: No    FAMILY HISTORY: History reviewed. No pertinent family history.  ALLERGIES:  has No Known Allergies.  MEDICATIONS:  Current Outpatient Medications  Medication Sig Dispense Refill   dexamethasone (DECADRON) 4 MG tablet Take 1 tablet (4 mg total) by mouth daily. Start the day after chemotherapy for 2 days. Take with food. 30 tablet 1    Docusate Sodium (DSS) 100 MG CAPS Take 1 capsule by mouth 2 (two) times daily. As needed for mild constipation.     Ferrous Sulfate (IRON PO) Take 1 tablet by mouth 3 (three) times a week.     gabapentin (NEURONTIN) 100 MG capsule Take 2 capsules (200 mg total) by mouth 3 (three) times daily. 180 capsule 0   HYDROcodone-acetaminophen (NORCO) 5-325 MG tablet Take 1 tablet by mouth every 6 (six) hours as needed for up to 30 doses for moderate pain. 30 tablet 0   ibuprofen (ADVIL) 800 MG tablet 800 mg.     lidocaine-prilocaine (EMLA) cream Apply to affected area once 30 g 3   Multiple Vitamin (MULTIVITAMIN) tablet Take 1 tablet by mouth daily.     ondansetron (ZOFRAN) 8 MG tablet Take 1 tablet (8 mg total) by mouth every 8 (eight) hours as needed for nausea or vomiting. Start on the third day after chemotherapy. 30 tablet 1   prochlorperazine (COMPAZINE) 10 MG tablet Take 1 tablet (10 mg total) by mouth every 6 (six) hours as needed for nausea or vomiting. 30 tablet 1   potassium chloride SA (  KLOR-CON M) 20 MEQ tablet Take 1 tablet (20 mEq total) by mouth daily for 3 days. 3 tablet 0   No current facility-administered medications for this visit.    REVIEW OF SYSTEMS:   Review of Systems  Constitutional:  Negative for chills, fever, malaise/fatigue and weight loss.  HENT:  Negative for hearing loss, nosebleeds, sore throat and tinnitus.   Eyes:  Negative for blurred vision and double vision.  Respiratory:  Negative for cough, hemoptysis, shortness of breath and wheezing.   Cardiovascular:  Negative for chest pain, palpitations and leg swelling.  Gastrointestinal:  Negative for abdominal pain, blood in stool, constipation, diarrhea, melena, nausea and vomiting.  Genitourinary:  Negative for dysuria and urgency.  Musculoskeletal:  Negative for back pain, falls, joint pain and myalgias.  Skin:  Negative for itching and rash.  Neurological:  Positive for tingling and sensory change. Negative for  dizziness, loss of consciousness, weakness and headaches.  Endo/Heme/Allergies:  Negative for environmental allergies. Does not bruise/bleed easily.  Psychiatric/Behavioral:  Negative for depression. The patient is not nervous/anxious and does not have insomnia.     PHYSICAL EXAMINATION: ECOG PERFORMANCE STATUS: 1 - Symptomatic but completely ambulatory  Vitals:   11/18/22 0827  BP: (!) 169/93  Pulse: 72  Temp: 97.6 F (36.4 C)  SpO2: 99%    Filed Weights   11/18/22 0827  Weight: 176 lb 9.6 oz (80.1 kg)   General: Well-developed, well-nourished, no acute distress. Eyes: Pink conjunctiva, anicteric sclera. Lungs: Clear to auscultation bilaterally.  No audible wheezing or coughing Heart: Regular rate and rhythm.  Abdomen: Soft, nontender, nondistended.  Musculoskeletal: No edema, cyanosis, or clubbing. Neuro: Alert, answering all questions appropriately. Cranial nerves grossly intact. Skin: No rashes or petechiae noted. Psych: Normal affect.   LABORATORY DATA:  I have reviewed the data as listed Lab Results  Component Value Date   WBC 5.7 11/18/2022   HGB 11.7 (L) 11/18/2022   HCT 33.9 (L) 11/18/2022   MCV 93.9 11/18/2022   PLT 125 (L) 11/18/2022   Recent Labs    10/21/22 0913 11/04/22 0859 11/18/22 0816  NA 139 138 136  K 3.3* 4.0 3.6  CL 109 107 108  CO2 25 25 23   GLUCOSE 81 110* 109*  BUN 23 21 19   CREATININE 1.04 1.02 0.96  CALCIUM 8.9 8.9 8.7*  GFRNONAA >60 >60 >60  PROT 6.4* 6.7 6.6  ALBUMIN 3.9 3.8 3.7  AST 21 28 32  ALT 15 21 24   ALKPHOS 85 81 80  BILITOT 0.6 0.6 0.5    RADIOGRAPHIC STUDIES: I have personally reviewed the radiological images as listed and agreed with the findings in the report. CT CHEST ABDOMEN PELVIS W CONTRAST  Result Date: 11/16/2022 CLINICAL DATA:  Colon cancer restaging, status post resection with left lower quadrant end colostomy and additional splenic flexure resection * Tracking Code: BO * EXAM: CT CHEST, ABDOMEN, AND  PELVIS WITH CONTRAST TECHNIQUE: Multidetector CT imaging of the chest, abdomen and pelvis was performed following the standard protocol during bolus administration of intravenous contrast. RADIATION DOSE REDUCTION: This exam was performed according to the departmental dose-optimization program which includes automated exposure control, adjustment of the mA and/or kV according to patient size and/or use of iterative reconstruction technique. CONTRAST:  OMNIPAQUE IOHEXOL 300 MG/ML SOLN additional oral enteric contrast COMPARISON:  06/29/2022 FINDINGS: CT CHEST FINDINGS Cardiovascular: Right chest port catheter. Aortic atherosclerosis. Normal heart size. Left and right coronary artery calcifications. No pericardial effusion. Mediastinum/Nodes: No enlarged mediastinal,  hilar, or axillary lymph nodes. Thyroid gland, trachea, and esophagus demonstrate no significant findings. Lungs/Pleura: Lungs are clear. No pleural effusion or pneumothorax. Musculoskeletal: No chest wall abnormality. No acute osseous findings. CT ABDOMEN PELVIS FINDINGS Hepatobiliary: No solid liver abnormality is seen. Multiple gallstones. No gallbladder wall thickening, or biliary dilatation. Pancreas: Unremarkable. No pancreatic ductal dilatation or surrounding inflammatory changes. Spleen: Normal in size without significant abnormality. Adrenals/Urinary Tract: Adrenal glands are unremarkable. Small nonobstructive left renal calculus. No right-sided calculi, ureteral calculi, or hydronephrosis. Small benign bilateral renal cortical cysts, for which no further follow-up or characterization is required. Jackstone bladder calculus measuring 1.0 cm (series 2, image 107). Stomach/Bowel: Stomach is within normal limits. Status post abdominoperineal resection and left lower quadrant end colostomy. Unchanged chronic postoperative presacral fluid collection in the low pelvis (series 2, image 108). Partial interval reduction of a previously seen large  parastomal hernia, with a single nonobstructed loop of distal colon remaining (series 2, image 106). Interval resection and reanastomosis of the splenic flexure of the colon. Vascular/Lymphatic: Aortic atherosclerosis. No enlarged abdominal or pelvic lymph nodes. Reproductive: No mass or other abnormality. Other: No abdominal wall hernia or abnormality. No ascites. Musculoskeletal: No acute osseous findings. IMPRESSION: 1. Status post abdominoperineal resection and left lower quadrant end colostomy. Unchanged chronic postoperative presacral fluid collection in the low pelvis. 2. Status post interval resection and reanastomosis of the splenic flexure, as well as partial interval reduction of a previously seen large parastomal hernia. 3. No evidence of lymphadenopathy or metastatic disease in the chest, abdomen, or pelvis. 4. Jackstone bladder calculus measuring 1.0 cm. 5. Nonobstructive left nephrolithiasis. 6. Cholelithiasis. 7. Coronary artery disease. Aortic Atherosclerosis (ICD10-I70.0). Electronically Signed   By: Jearld Lesch M.D.   On: 11/16/2022 15:13

## 2022-11-19 ENCOUNTER — Other Ambulatory Visit: Payer: Self-pay

## 2022-11-19 LAB — CEA: CEA: 4 ng/mL (ref 0.0–4.7)

## 2022-11-20 ENCOUNTER — Inpatient Hospital Stay: Payer: PPO | Attending: Internal Medicine

## 2022-11-20 VITALS — BP 154/81 | HR 66 | Resp 18

## 2022-11-20 DIAGNOSIS — C184 Malignant neoplasm of transverse colon: Secondary | ICD-10-CM | POA: Insufficient documentation

## 2022-11-20 DIAGNOSIS — Z452 Encounter for adjustment and management of vascular access device: Secondary | ICD-10-CM | POA: Insufficient documentation

## 2022-11-20 DIAGNOSIS — Z5111 Encounter for antineoplastic chemotherapy: Secondary | ICD-10-CM | POA: Insufficient documentation

## 2022-11-20 DIAGNOSIS — Z79899 Other long term (current) drug therapy: Secondary | ICD-10-CM | POA: Diagnosis not present

## 2022-11-20 MED ORDER — SODIUM CHLORIDE 0.9% FLUSH
10.0000 mL | INTRAVENOUS | Status: DC | PRN
  Administered 2022-11-20: 10 mL
  Filled 2022-11-20: qty 10

## 2022-11-20 MED ORDER — HEPARIN SOD (PORK) LOCK FLUSH 100 UNIT/ML IV SOLN
500.0000 [IU] | Freq: Once | INTRAVENOUS | Status: AC | PRN
  Administered 2022-11-20: 500 [IU]
  Filled 2022-11-20: qty 5

## 2022-11-29 MED FILL — Fluorouracil IV Soln 2.5 GM/50ML (50 MG/ML): INTRAVENOUS | Qty: 15 | Status: AC

## 2022-11-29 MED FILL — Fluorouracil IV Soln 5 GM/100ML (50 MG/ML): INTRAVENOUS | Qty: 100 | Status: AC

## 2022-11-29 MED FILL — Dexamethasone Sodium Phosphate Inj 100 MG/10ML: INTRAMUSCULAR | Qty: 1 | Status: AC

## 2022-12-02 ENCOUNTER — Encounter: Payer: Self-pay | Admitting: Internal Medicine

## 2022-12-02 ENCOUNTER — Inpatient Hospital Stay: Payer: PPO

## 2022-12-02 ENCOUNTER — Inpatient Hospital Stay (HOSPITAL_BASED_OUTPATIENT_CLINIC_OR_DEPARTMENT_OTHER): Payer: PPO | Admitting: Internal Medicine

## 2022-12-02 VITALS — BP 152/81 | HR 73 | Temp 98.6°F | Wt 174.4 lb

## 2022-12-02 DIAGNOSIS — C184 Malignant neoplasm of transverse colon: Secondary | ICD-10-CM | POA: Diagnosis not present

## 2022-12-02 DIAGNOSIS — Z5111 Encounter for antineoplastic chemotherapy: Secondary | ICD-10-CM | POA: Diagnosis not present

## 2022-12-02 DIAGNOSIS — T451X5A Adverse effect of antineoplastic and immunosuppressive drugs, initial encounter: Secondary | ICD-10-CM

## 2022-12-02 DIAGNOSIS — F5101 Primary insomnia: Secondary | ICD-10-CM

## 2022-12-02 DIAGNOSIS — G620 Drug-induced polyneuropathy: Secondary | ICD-10-CM | POA: Diagnosis not present

## 2022-12-02 DIAGNOSIS — R21 Rash and other nonspecific skin eruption: Secondary | ICD-10-CM | POA: Diagnosis not present

## 2022-12-02 DIAGNOSIS — C189 Malignant neoplasm of colon, unspecified: Secondary | ICD-10-CM | POA: Diagnosis not present

## 2022-12-02 LAB — COMPREHENSIVE METABOLIC PANEL
ALT: 21 U/L (ref 0–44)
AST: 31 U/L (ref 15–41)
Albumin: 3.8 g/dL (ref 3.5–5.0)
Alkaline Phosphatase: 68 U/L (ref 38–126)
Anion gap: 10 (ref 5–15)
BUN: 15 mg/dL (ref 8–23)
CO2: 22 mmol/L (ref 22–32)
Calcium: 8.9 mg/dL (ref 8.9–10.3)
Chloride: 106 mmol/L (ref 98–111)
Creatinine, Ser: 0.9 mg/dL (ref 0.61–1.24)
GFR, Estimated: >60 mL/min (ref >60)
Glucose, Bld: 134 mg/dL — ABNORMAL HIGH (ref 70–99)
Potassium: 3.7 mmol/L (ref 3.5–5.1)
Sodium: 138 mmol/L (ref 135–145)
Total Bilirubin: 0.7 mg/dL (ref 0.3–1.2)
Total Protein: 6.8 g/dL (ref 6.5–8.1)

## 2022-12-02 LAB — CBC WITH DIFFERENTIAL/PLATELET
Abs Immature Granulocytes: 0.03 10*3/uL (ref 0.00–0.07)
Basophils Absolute: 0.1 10*3/uL (ref 0.0–0.1)
Basophils Relative: 2 %
Eosinophils Absolute: 0.2 10*3/uL (ref 0.0–0.5)
Eosinophils Relative: 3 %
HCT: 36.8 % — ABNORMAL LOW (ref 39.0–52.0)
Hemoglobin: 12.6 g/dL — ABNORMAL LOW (ref 13.0–17.0)
Immature Granulocytes: 1 %
Lymphocytes Relative: 34 %
Lymphs Abs: 2 10*3/uL (ref 0.7–4.0)
MCH: 32.8 pg (ref 26.0–34.0)
MCHC: 34.2 g/dL (ref 30.0–36.0)
MCV: 95.8 fL (ref 80.0–100.0)
Monocytes Absolute: 0.7 10*3/uL (ref 0.1–1.0)
Monocytes Relative: 11 %
Neutro Abs: 2.9 10*3/uL (ref 1.7–7.7)
Neutrophils Relative %: 49 %
Platelets: 147 10*3/uL — ABNORMAL LOW (ref 150–400)
RBC: 3.84 MIL/uL — ABNORMAL LOW (ref 4.22–5.81)
RDW: 21.8 % — ABNORMAL HIGH (ref 11.5–15.5)
WBC: 5.8 10*3/uL (ref 4.0–10.5)
nRBC: 0 % (ref 0.0–0.2)

## 2022-12-02 MED ORDER — SODIUM CHLORIDE 0.9 % IV SOLN
2400.0000 mg/m2 | INTRAVENOUS | Status: DC
  Administered 2022-12-02: 5000 mg via INTRAVENOUS
  Filled 2022-12-02: qty 100

## 2022-12-02 MED ORDER — LEUCOVORIN CALCIUM INJECTION 350 MG
400.0000 mg/m2 | Freq: Once | INTRAVENOUS | Status: AC
  Administered 2022-12-02: 752 mg via INTRAVENOUS
  Filled 2022-12-02: qty 37.6

## 2022-12-02 MED ORDER — FLUOROURACIL CHEMO INJECTION 2.5 GM/50ML
400.0000 mg/m2 | Freq: Once | INTRAVENOUS | Status: AC
  Administered 2022-12-02: 750 mg via INTRAVENOUS
  Filled 2022-12-02: qty 15

## 2022-12-02 MED ORDER — SODIUM CHLORIDE 0.9 % IV SOLN
INTRAVENOUS | Status: DC
  Filled 2022-12-02: qty 250

## 2022-12-02 MED ORDER — SODIUM CHLORIDE 0.9 % IV SOLN
10.0000 mg | Freq: Once | INTRAVENOUS | Status: AC
  Administered 2022-12-02: 10 mg via INTRAVENOUS
  Filled 2022-12-02: qty 10

## 2022-12-02 MED ORDER — ZOLPIDEM TARTRATE 5 MG PO TABS: 5.0000 mg | ORAL_TABLET | Freq: Every evening | ORAL | 0 refills | Status: DC | PRN

## 2022-12-02 NOTE — Progress Notes (Signed)
[facility] CONSULT NOTE  Patient Care Team: [name], MD as PCP - General (Family Medicine) [name], RN as Oncology Nurse Navigator   CANCER STAGING   Cancer Staging  Cancer of transverse colon [facility]) Staging form: Colon and Rectum, AJCC 8th Edition - Clinical: Stage IIIC (cT4b, cN1a, cM0) - Signed by [name], MD on 07/12/2022 Stage prefix: Initial diagnosis Total positive nodes: 1 Total nodes examined: 24   ASSESSMENT & PLAN:  [name] 72 y.o. male with pmh of colon cancer s/p left sided colostomy in 2011, chemotherapy, neuropathy follows with Medical Oncology for Stage IIIC colon cancer.   #Transverse colon adenocarcinoma, Stage IIIC ([id number]), MMR proficient - presented with bowel obstruction. S/p resection and reanastomosis with Dr. [name] on 12/13. CT imaging negative for metastasis.  -Oxaliplatin held with cycle 4 due to worsening cold induced neuropathy.  Neuropathy has improved -> resumed oxaliplatin 50 mg/m with cycle 6.  Oxaliplatin permanently discontinued with cycle 8.  Labs reviewed and acceptable for treatment.  Will proceed with cycle 9 of 5-FU/leucovorin today.  Plan for total 12 cycles.  -CT chest abdomen pelvis (11/12/22) NED.  Plan to repeat CT imaging in 6 months.  Repeat colonoscopy due in December 2024.  # Rash -On bilateral forearms.  Started after exposure to blanket in infusion room.  Improved and now worsened. -Continue to use moisturizer.  Advised to hold off gabapentin as it worsened about the same time. -Will continue to monitor.  If does not improve in next 2 to 3 weeks will consider dermatology referral.  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  Oxaliplatin discontinued  # Insomnia -On melatonin 10 mg nightly.  Which improved his sleep but still not adequate -We discussed about trial of different sedative such as benzodiazepine and patient will try  that because he has been feeling very fatigued due to inability to sleep well. -Ambien 5 mg nightly as needed sent  # Arthritis -Patient has multiple arthritic joints including shoulders or hips.  Worsened with chemotherapy -Takes ibuprofen in the morning and Norco at night.    # Iron deficiency anemia - s/p IV Venofer x 2 doses.  Continue with iron pills.  #History of colon cancer s/p left sided colostomy, chemo in 2011 -Surgery and had adjuvant chemotherapy.  No records are available.  # Access -port placed on 08/08/2022 by Dr. [name]  No orders of the defined types were placed in this encounter.  RTC in 2 weeks for MD visit, labs, cycle 10 of 5-FU leucovorin  The total time spent in the appointment was 30 minutes encounter with patients including review of chart and various tests results, discussions about plan of care and coordination of care plan   All questions were answered. The patient knows to call the clinic with any problems, questions or concerns. No barriers to learning was detected.  [name], MD 5/13/202410:39 AM   HISTORY OF PRESENTING ILLNESS:  [name] 72 y.o. male with pmh of colon cancer s/p left sided colostomy in 2011, chemotherapy, neuropathy follows with Medical Oncology for Stage IIIC colon cancer.   Interval history Patient in today accompanied with wife prior to cycle 9 of 5-FU/leucovorin.  Patient continues to have worsening of neuropathy with oxaliplatin and was permanently discontinued with cycle 8.  Neuropathy has improved a little bit.  He reports low energy level which has been getting worse since initiation of chemotherapy and as we are going to  have the treatment cycles.  Nausea is well-managed with Zofran and dexamethasone which she takes for 2 days after treatment.  Denies any diarrhea.  Appetite is good.  Has chronic arthritic pain.  Not able to sleep well at night despite on the melatonin.  Worsening rash on bilateral arms.  Rash  initially started after he had reaction of the blanket while he was in the infusion room.  Rashes improved and now has been getting worse.  He is using aloe to keep it moisturized.  I have reviewed his chart and materials related to his cancer extensively and collaborated history with the patient. Summary of oncologic history is as follows: Oncology History Overview Note  Patient has a history of colon cancer in 2011 treated with Dr. [name] with radiation followed by surgery s/p left sided colostomy and adjuvant chemo likely FOLFOX. Records unavailable.    Cancer of transverse colon (HCC)  06/29/2022 Initial Diagnosis   Presented to ED for abdominal pain, nausea and vomiting.    Imaging   CT abdo pelvis IMPRESSION: 1. Patient is status post abdominoperineal resection with left lower quadrant ostomy. The ascending and transverse colon are moderately distended and there is abrupt caliber change at the distal transverse colon/splenic flexure where there is irregular masslike thickening. Findings are suspicious for colonic obstruction due to a mass at this location. There is no dilated small bowel. 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 2. Slightly thick-walled presacral fluid collection with peripheral calcification, may reflect chronic postoperative collection. 3. Gallstones. 4. Aortic atherosclerosis.  CT chest IMPRESSION: 1. No acute intrathoracic process. No evidence of intrathoracic metastases.    Procedure   Colonoscopy on 06/29/22 by Dr. [name] A fungating partially obstructing mass in mid transverse colon.   07/03/2022 Pathology Results   DIAGNOSIS: A. 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 - SEE CANCER SUMMARY BELOW. - TATTOO INK. - SEPARATE 4.5 CM LENGTH OF SMALL INTESTINE.  CANCER CASE SUMMARY: COLON AND RECTUM Standard(s):  AJCC-UICC 8  SPECIMEN Procedure: Transverse colectomy  TUMOR Tumor Site: Transverse colon Histologic Type: Adenocarcinoma Histologic Grade: G2, moderately differentiated Tumor Size: Greatest dimension: 6.1 cm Tumor Extent: Directly invades or adheres to adjacent structures (small intestine) Macroscopic Tumor Perforation: Not identified Lymphatic and/or Vascular Invasion: Large vessel (venous), extramural Perineural Invasion: Present Tumor Budding Score: High (10 or more) Treatment Effect: No known presurgical therapy  MARGINS Margin Status for Invasive Carcinoma: All margins negative for invasive carcinoma      Margins examined: Proximal, distal, and mesenteric Margin Status for Non-Invasive Tumor: All margins negative for high-grade dysplasia/intramucosal carcinoma and low-grade dysplasia  REGIONAL LYMPH NODES Regional Lymph Nodes: Regional lymph nodes present      Tumor present in regional lymph node(s)           Number of Lymph Nodes with Tumor: 1      Number of Lymph Nodes Examined: 24  Tumor Deposits: Not identified  DISTANT METASTASES Distant Site(s) Involved, if applicable: Not applicable  PATHOLOGIC STAGE CLASSIFICATION (pTNM, AJCC 8th Edition): Modified Classification: Not applicable pT4b      T suffix: Not applicable pN1a pM not applicable - pM cannot be determined from the submitted specimen(s)    07/03/2022 Procedure   Exploratory laparotomy, en bloc colon resection, reanastomosis, primary repair of parastomal hernia by Dr. [name]     07/12/2022 Cancer Staging   Staging form: Colon and Rectum, AJCC 8th Edition -  Clinical: Stage IIIC (cT4b, cN1a, cM0) - Signed by [name], MD on 07/12/2022 Stage prefix: Initial diagnosis Total positive nodes: 1 Total nodes examined: 24   08/12/2022 -  Chemotherapy   Patient is on Treatment Plan : COLORECTAL FOLFOX q14d x 6 months       MEDICAL HISTORY:  Past Medical History:  Diagnosis Date   Colon  cancer (HCC)    remission 2011   Heart murmur    as a child from rheumatic fever   Malignant neoplasm of transverse colon (HCC) 2024   Microcytic anemia    Neuropathy    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HCC)    Protein-calorie malnutrition, severe (HCC)     SURGICAL HISTORY: Past Surgical History:  Procedure Laterality Date   APPENDECTOMY     COLECTOMY WITH COLOSTOMY CREATION/HARTMANN PROCEDURE N/A 07/03/2022   Procedure: COLECTOMY WITH COLOSTOMY CREATION/HARTMANN PROCEDURE;  Surgeon: Sung Amabile, DO;  Location: [facility] ORS;  Service: General;  Laterality: N/A;   COLONOSCOPY N/A 06/29/2022   Procedure: COLONOSCOPY;  Surgeon: Sung Amabile, DO;  Location: [facility] ENDOSCOPY;  Service: General;  Laterality: N/A;   PORTACATH PLACEMENT Right 08/08/2022   Procedure: INSERTION PORT-A-CATH;  Surgeon: Sung Amabile, DO;  Location: [facility] ORS;  Service: General;  Laterality: Right;    SOCIAL HISTORY: Social History   Socioeconomic History   Marital status: Single    Spouse name: Not on file   Number of children: Not on file   Years of education: Not on file   Highest education level: Not on file  Occupational History   Not on file  Tobacco Use   Smoking status: Never   Smokeless tobacco: Never  Vaping Use   Vaping Use: Never used  Substance and Sexual Activity   Alcohol use: Not Currently   Drug use: Never   Sexual activity: Not on file  Other Topics Concern   Not on file  Social History Narrative   Not on file   Social Determinants of Health   Financial Resource Strain: Not on file  Food Insecurity: Unknown (06/29/2022)   Hunger Vital Sign    Worried About Running Out of Food in the Last Year: Never true    Ran Out of Food in the Last Year: Not on file  Transportation Needs: No Transportation Needs (06/29/2022)   PRAPARE - Administrator, Civil Service (Medical): No    Lack of Transportation (Non-Medical): No  Physical Activity: Not on file  Stress: Not on file  Social  Connections: Not on file  Intimate Partner Violence: Not At Risk (06/29/2022)   Humiliation, Afraid, Rape, and Kick questionnaire    Fear of Current or Ex-Partner: No    Emotionally Abused: No    Physically Abused: No    Sexually Abused: No    FAMILY HISTORY: History reviewed. No pertinent family history.  ALLERGIES:  has No Known Allergies.  MEDICATIONS:  Current Outpatient Medications  Medication Sig Dispense Refill   dexamethasone (DECADRON) 4 MG tablet Take 1 tablet (4 mg total) by mouth daily. Start the day after chemotherapy for 2 days. Take with food. 30 tablet 1   Docusate Sodium (DSS) 100 MG CAPS Take 1 capsule by mouth 2 (two) times daily. As needed for mild constipation.     Ferrous Sulfate (IRON PO) Take 1 tablet by mouth 3 (three) times a week.     gabapentin (NEURONTIN) 100 MG capsule Take 2 capsules (200 mg total) by mouth 3 (three) times daily. 180 capsule 0  HYDROcodone-acetaminophen (NORCO) 5-325 MG tablet Take 1 tablet by mouth every 6 (six) hours as needed for up to 30 doses for moderate pain. 30 tablet 0   ibuprofen (ADVIL) 800 MG tablet 800 mg.     lidocaine-prilocaine (EMLA) cream Apply to affected area once 30 g 3   Multiple Vitamin (MULTIVITAMIN) tablet Take 1 tablet by mouth daily.     ondansetron (ZOFRAN) 8 MG tablet Take 1 tablet (8 mg total) by mouth every 8 (eight) hours as needed for nausea or vomiting. Start on the third day after chemotherapy. 30 tablet 1   prochlorperazine (COMPAZINE) 10 MG tablet Take 1 tablet (10 mg total) by mouth every 6 (six) hours as needed for nausea or vomiting. 30 tablet 1   potassium chloride SA (KLOR-CON M) 20 MEQ tablet Take 1 tablet (20 mEq total) by mouth daily for 3 days. (Patient not taking: Reported on 12/02/2022) 3 tablet 0   No current facility-administered medications for this visit.   Facility-Administered Medications Ordered in Other Visits  Medication Dose Route Frequency Provider Last Rate Last Admin   0.9 %   sodium chloride infusion   Intravenous Continuous [name], MD       dexamethasone (DECADRON) 10 mg in sodium chloride 0.9 % 50 mL IVPB  10 mg Intravenous Once Alinda Dooms, NP       fluorouracil (ADRUCIL) 5,000 mg in sodium chloride 0.9 % 150 mL chemo infusion  2,400 mg/m2 (Treatment Plan Recorded) Intravenous 1 day or 1 dose Alinda Dooms, NP       fluorouracil (ADRUCIL) chemo injection 750 mg  400 mg/m2 (Treatment Plan Recorded) Intravenous Once Alinda Dooms, NP       leucovorin 752 mg in dextrose 5 % 250 mL infusion  400 mg/m2 (Treatment Plan Recorded) Intravenous Once Alinda Dooms, NP        REVIEW OF SYSTEMS:   Pertinent information mentioned in HPI All other systems were reviewed with the patient and are negative.  PHYSICAL EXAMINATION: ECOG PERFORMANCE STATUS: 1 - Symptomatic but completely ambulatory  Vitals:   12/02/22 0950  BP: (!) 152/81  Pulse: 73  Temp: 98.6 F (37 C)  SpO2: 99%    Filed Weights   12/02/22 0950  Weight: 174 lb 6.4 oz (79.1 kg)     GENERAL:alert, no distress and comfortable SKIN: skin color, texture, turgor are normal, no rashes or significant lesions EYES: normal, conjunctiva are pink and non-injected, sclera clear OROPHARYNX:no exudate, no erythema and lips, buccal mucosa, and tongue normal  NECK: supple, thyroid normal size, non-tender, without nodularity LYMPH:  no palpable lymphadenopathy in the cervical, axillary or inguinal LUNGS: clear to auscultation and percussion with normal breathing effort HEART: regular rate & rhythm and no murmurs and no lower extremity edema ABDOMEN:abdomen soft, non-tender and normal bowel sounds Musculoskeletal:no cyanosis of digits and no clubbing  PSYCH: alert & oriented x 3 with fluent speech NEURO: no focal motor/sensory deficits  LABORATORY DATA:  I have reviewed the data as listed Lab Results  Component Value Date   WBC 5.8 12/02/2022   HGB 12.6 (L) 12/02/2022   HCT 36.8 (L)  12/02/2022   MCV 95.8 12/02/2022   PLT 147 (L) 12/02/2022   Recent Labs    11/04/22 0859 11/18/22 0816 12/02/22 0925  NA 138 136 138  K 4.0 3.6 3.7  CL 107 108 106  CO2 25 23 22   GLUCOSE 110* 109* 134*  BUN 21 19 15   CREATININE 1.02  0.96 0.90  CALCIUM 8.9 8.7* 8.9  GFRNONAA >60 >60 >60  PROT 6.7 6.6 6.8  ALBUMIN 3.8 3.7 3.8  AST 28 32 31  ALT 21 24 21   ALKPHOS 81 80 68  BILITOT 0.6 0.5 0.7    RADIOGRAPHIC STUDIES: I have personally reviewed the radiological images as listed and agreed with the findings in the report. CT CHEST ABDOMEN PELVIS W CONTRAST  Result Date: 11/16/2022 CLINICAL DATA:  Colon cancer restaging, status post resection with left lower quadrant end colostomy and additional splenic flexure resection * Tracking Code: BO * EXAM: CT CHEST, ABDOMEN, AND PELVIS WITH CONTRAST TECHNIQUE: Multidetector CT imaging of the chest, abdomen and pelvis was performed following the standard protocol during bolus administration of intravenous contrast. RADIATION DOSE REDUCTION: This exam was performed according to the departmental dose-optimization program which includes automated exposure control, adjustment of the mA and/or kV according to patient size and/or use of iterative reconstruction technique. CONTRAST:  OMNIPAQUE IOHEXOL 300 MG/ML SOLN additional oral enteric contrast COMPARISON:  06/29/2022 FINDINGS: CT CHEST FINDINGS Cardiovascular: Right chest port catheter. Aortic atherosclerosis. Normal heart size. Left and right coronary artery calcifications. No pericardial effusion. Mediastinum/Nodes: No enlarged mediastinal, hilar, or axillary lymph nodes. Thyroid gland, trachea, and esophagus demonstrate no significant findings. Lungs/Pleura: Lungs are clear. No pleural effusion or pneumothorax. Musculoskeletal: No chest wall abnormality. No acute osseous findings. CT ABDOMEN PELVIS FINDINGS Hepatobiliary: No solid liver abnormality is seen. Multiple gallstones. No gallbladder  wall thickening, or biliary dilatation. Pancreas: Unremarkable. No pancreatic ductal dilatation or surrounding inflammatory changes. Spleen: Normal in size without significant abnormality. Adrenals/Urinary Tract: Adrenal glands are unremarkable. Small nonobstructive left renal calculus. No right-sided calculi, ureteral calculi, or hydronephrosis. Small benign bilateral renal cortical cysts, for which no further follow-up or characterization is required. Jackstone bladder calculus measuring 1.0 cm (series 2, image 107). Stomach/Bowel: Stomach is within normal limits. Status post abdominoperineal resection and left lower quadrant end colostomy. Unchanged chronic postoperative presacral fluid collection in the low pelvis (series 2, image 108). Partial interval reduction of a previously seen large parastomal hernia, with a single nonobstructed loop of distal colon remaining (series 2, image 106). Interval resection and reanastomosis of the splenic flexure of the colon. Vascular/Lymphatic: Aortic atherosclerosis. No enlarged abdominal or pelvic lymph nodes. Reproductive: No mass or other abnormality. Other: No abdominal wall hernia or abnormality. No ascites. Musculoskeletal: No acute osseous findings. IMPRESSION: 1. Status post abdominoperineal resection and left lower quadrant end colostomy. Unchanged chronic postoperative presacral fluid collection in the low pelvis. 2. Status post interval resection and reanastomosis of the splenic flexure, as well as partial interval reduction of a previously seen large parastomal hernia. 3. No evidence of lymphadenopathy or metastatic disease in the chest, abdomen, or pelvis. 4. Jackstone bladder calculus measuring 1.0 cm. 5. Nonobstructive left nephrolithiasis. 6. Cholelithiasis. 7. Coronary artery disease. Aortic Atherosclerosis (ICD10-I70.0). Electronically Signed   By: Jearld Lesch M.D.   On: 11/16/2022 15:13

## 2022-12-03 ENCOUNTER — Other Ambulatory Visit: Payer: Self-pay

## 2022-12-04 ENCOUNTER — Inpatient Hospital Stay: Payer: PPO

## 2022-12-04 VITALS — BP 150/78

## 2022-12-04 DIAGNOSIS — C184 Malignant neoplasm of transverse colon: Secondary | ICD-10-CM

## 2022-12-04 DIAGNOSIS — Z5111 Encounter for antineoplastic chemotherapy: Secondary | ICD-10-CM | POA: Diagnosis not present

## 2022-12-04 MED ORDER — HEPARIN SOD (PORK) LOCK FLUSH 100 UNIT/ML IV SOLN
500.0000 [IU] | Freq: Once | INTRAVENOUS | Status: AC | PRN
  Administered 2022-12-04: 500 [IU]
  Filled 2022-12-04: qty 5

## 2022-12-04 MED ORDER — SODIUM CHLORIDE 0.9% FLUSH
10.0000 mL | INTRAVENOUS | Status: DC | PRN
  Administered 2022-12-04: 10 mL
  Filled 2022-12-04: qty 10

## 2022-12-06 ENCOUNTER — Other Ambulatory Visit: Payer: Self-pay | Admitting: Internal Medicine

## 2022-12-06 MED ORDER — HYDROCODONE-ACETAMINOPHEN 5-325 MG PO TABS: 1.0000 | ORAL_TABLET | Freq: Four times a day (QID) | ORAL | 0 refills | Status: DC | PRN

## 2022-12-11 DIAGNOSIS — C189 Malignant neoplasm of colon, unspecified: Secondary | ICD-10-CM | POA: Diagnosis not present

## 2022-12-13 MED FILL — Dexamethasone Sodium Phosphate Inj 100 MG/10ML: INTRAMUSCULAR | Qty: 1 | Status: AC

## 2022-12-17 ENCOUNTER — Inpatient Hospital Stay (HOSPITAL_BASED_OUTPATIENT_CLINIC_OR_DEPARTMENT_OTHER): Payer: PPO | Admitting: Internal Medicine

## 2022-12-17 ENCOUNTER — Inpatient Hospital Stay: Payer: PPO

## 2022-12-17 ENCOUNTER — Encounter: Payer: Self-pay | Admitting: Internal Medicine

## 2022-12-17 VITALS — BP 149/93 | HR 72 | Temp 98.2°F | Wt 176.5 lb

## 2022-12-17 DIAGNOSIS — T451X5A Adverse effect of antineoplastic and immunosuppressive drugs, initial encounter: Secondary | ICD-10-CM | POA: Diagnosis not present

## 2022-12-17 DIAGNOSIS — C184 Malignant neoplasm of transverse colon: Secondary | ICD-10-CM | POA: Diagnosis not present

## 2022-12-17 DIAGNOSIS — Z5111 Encounter for antineoplastic chemotherapy: Secondary | ICD-10-CM

## 2022-12-17 DIAGNOSIS — F5101 Primary insomnia: Secondary | ICD-10-CM

## 2022-12-17 DIAGNOSIS — D6481 Anemia due to antineoplastic chemotherapy: Secondary | ICD-10-CM | POA: Diagnosis not present

## 2022-12-17 DIAGNOSIS — C189 Malignant neoplasm of colon, unspecified: Secondary | ICD-10-CM | POA: Diagnosis not present

## 2022-12-17 LAB — CBC WITH DIFFERENTIAL/PLATELET
Abs Immature Granulocytes: 0.02 10*3/uL (ref 0.00–0.07)
Basophils Absolute: 0.1 10*3/uL (ref 0.0–0.1)
Basophils Relative: 2 %
Eosinophils Absolute: 0.2 10*3/uL (ref 0.0–0.5)
Eosinophils Relative: 4 %
HCT: 33.1 % — ABNORMAL LOW (ref 39.0–52.0)
Hemoglobin: 11.4 g/dL — ABNORMAL LOW (ref 13.0–17.0)
Immature Granulocytes: 0 %
Lymphocytes Relative: 27 %
Lymphs Abs: 1.6 10*3/uL (ref 0.7–4.0)
MCH: 33.9 pg (ref 26.0–34.0)
MCHC: 34.4 g/dL (ref 30.0–36.0)
MCV: 98.5 fL (ref 80.0–100.0)
Monocytes Absolute: 1 10*3/uL (ref 0.1–1.0)
Monocytes Relative: 17 %
Neutro Abs: 3 10*3/uL (ref 1.7–7.7)
Neutrophils Relative %: 50 %
Platelets: 127 10*3/uL — ABNORMAL LOW (ref 150–400)
RBC: 3.36 MIL/uL — ABNORMAL LOW (ref 4.22–5.81)
RDW: 19.1 % — ABNORMAL HIGH (ref 11.5–15.5)
WBC: 5.9 10*3/uL (ref 4.0–10.5)
nRBC: 0 % (ref 0.0–0.2)

## 2022-12-17 LAB — COMPREHENSIVE METABOLIC PANEL
ALT: 21 U/L (ref 0–44)
AST: 27 U/L (ref 15–41)
Albumin: 3.8 g/dL (ref 3.5–5.0)
Alkaline Phosphatase: 74 U/L (ref 38–126)
Anion gap: 6 (ref 5–15)
BUN: 15 mg/dL (ref 8–23)
CO2: 25 mmol/L (ref 22–32)
Calcium: 8.6 mg/dL — ABNORMAL LOW (ref 8.9–10.3)
Chloride: 104 mmol/L (ref 98–111)
Creatinine, Ser: 0.98 mg/dL (ref 0.61–1.24)
GFR, Estimated: >60 mL/min (ref >60)
Glucose, Bld: 87 mg/dL (ref 70–99)
Potassium: 4 mmol/L (ref 3.5–5.1)
Sodium: 135 mmol/L (ref 135–145)
Total Bilirubin: 0.8 mg/dL (ref 0.3–1.2)
Total Protein: 6.6 g/dL (ref 6.5–8.1)

## 2022-12-17 MED ORDER — FLUOROURACIL CHEMO INJECTION 2.5 GM/50ML
400.0000 mg/m2 | Freq: Once | INTRAVENOUS | Status: AC
  Administered 2022-12-17: 750 mg via INTRAVENOUS
  Filled 2022-12-17: qty 15

## 2022-12-17 MED ORDER — SODIUM CHLORIDE 0.9 % IV SOLN
2400.0000 mg/m2 | INTRAVENOUS | Status: DC
  Administered 2022-12-17: 5000 mg via INTRAVENOUS
  Filled 2022-12-17: qty 100

## 2022-12-17 MED ORDER — DEXTROSE 5 % IV SOLN
Freq: Once | INTRAVENOUS | Status: AC
  Filled 2022-12-17: qty 250

## 2022-12-17 MED ORDER — LEUCOVORIN CALCIUM INJECTION 350 MG
400.0000 mg/m2 | Freq: Once | INTRAVENOUS | Status: AC
  Administered 2022-12-17: 752 mg via INTRAVENOUS
  Filled 2022-12-17: qty 37.6

## 2022-12-17 MED ORDER — SODIUM CHLORIDE 0.9 % IV SOLN
10.0000 mg | Freq: Once | INTRAVENOUS | Status: AC
  Administered 2022-12-17: 10 mg via INTRAVENOUS
  Filled 2022-12-17: qty 10

## 2022-12-17 NOTE — Progress Notes (Signed)
[facility] CONSULT NOTE  Patient Care Team: [name], MD as PCP - General (Family Medicine) [name], RN as Oncology Nurse Navigator   CANCER STAGING   Cancer Staging  Cancer of transverse colon [facility]) Staging form: Colon and Rectum, AJCC 8th Edition - Clinical: Stage IIIC (cT4b, cN1a, cM0) - Signed by [name], MD on 07/12/2022 Stage prefix: Initial diagnosis Total positive nodes: 1 Total nodes examined: 24   ASSESSMENT & PLAN:  [name] 72 y.o. male with pmh of colon cancer s/p left sided colostomy in 2011, chemotherapy, neuropathy follows with Medical Oncology for Stage IIIC colon cancer.   #Transverse colon adenocarcinoma, Stage IIIC ([id number]), MMR proficient - presented with bowel obstruction. S/p resection and reanastomosis with Dr. [name] on 12/13. CT imaging negative for metastasis.  -Oxaliplatin held with cycle 4 due to worsening cold induced neuropathy.  Neuropathy has improved -> resumed oxaliplatin 50 mg/m with cycle 6.  Oxaliplatin permanently discontinued with cycle 8.  Labs reviewed and acceptable for treatment.  Will proceed with cycle 10 of 5-FU/leucovorin today.  Plan for total 12 cycles.  -CT chest abdomen pelvis (11/12/22) NED.  Plan to repeat CT imaging in 6 months.  Repeat colonoscopy due in December 2024.  # Rash -On bilateral forearms.  Started after exposure to blanket in infusion room.  Improved and now worsened. -Continue to use moisturizer.  Advised to hold off gabapentin as it worsened about the same time. -Will continue to monitor.  If does not improve in next 2 to 3 weeks will consider dermatology referral.  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  Oxaliplatin discontinued  # Insomnia -Taking Ambien 5 mg as needed.  Helping better than melatonin.  # Arthritis -Patient has multiple arthritic joints including shoulders or hips.  Worsened with  chemotherapy -Takes ibuprofen in the morning and Norco at night.    # Iron deficiency anemia - s/p IV Venofer x 2 doses.  Continue with iron pills.  #History of colon cancer s/p left sided colostomy, chemo in 2011 -Surgery and had adjuvant chemotherapy.  No records are available.  # Access -port placed on 08/08/2022 by Dr. [name]  Orders Placed This Encounter  Procedures   CBC with Differential    Standing Status:   Future    Standing Expiration Date:   01/13/2024   Comprehensive metabolic panel    Standing Status:   Future    Standing Expiration Date:   01/13/2024   CBC with Differential    Standing Status:   Future    Standing Expiration Date:   12/30/2023   Comprehensive metabolic panel    Standing Status:   Future    Standing Expiration Date:   12/30/2023   RTC in 2 weeks for MD visit, labs, cycle 10 of 5-FU leucovorin  The total time spent in the appointment was 30 minutes encounter with patients including review of chart and various tests results, discussions about plan of care and coordination of care plan   All questions were answered. The patient knows to call the clinic with any problems, questions or concerns. No barriers to learning was detected.  [name], MD 5/28/20243:43 PM   HISTORY OF PRESENTING ILLNESS:  [name] 72 y.o. male with pmh of colon cancer s/p left sided colostomy in 2011, chemotherapy, neuropathy follows with Medical Oncology for Stage IIIC colon cancer.   Interval history Patient in today accompanied with wife prior to cycle 10 of  5-FU/leucovorin.  Doing okay with the treatments overall.  Energy levels tend to be low.  Eating well.  Started Ambien which is helping with sleep.  I have reviewed his chart and materials related to his cancer extensively and collaborated history with the patient. Summary of oncologic history is as follows: Oncology History Overview Note  Patient has a history of colon cancer in 2011 treated with Dr.  [name] with radiation followed by surgery s/p left sided colostomy and adjuvant chemo likely FOLFOX. Records unavailable.    Cancer of transverse colon (HCC)  06/29/2022 Initial Diagnosis   Presented to ED for abdominal pain, nausea and vomiting.    Imaging   CT abdo pelvis IMPRESSION: 1. Patient is status post abdominoperineal resection with left lower quadrant ostomy. The ascending and transverse colon are moderately distended and there is abrupt caliber change at the distal transverse colon/splenic flexure where there is irregular masslike thickening. Findings are suspicious for colonic obstruction due to a mass at this location. There is no dilated small bowel. 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 2. Slightly thick-walled presacral fluid collection with peripheral calcification, may reflect chronic postoperative collection. 3. Gallstones. 4. Aortic atherosclerosis.  CT chest IMPRESSION: 1. No acute intrathoracic process. No evidence of intrathoracic metastases.    Procedure   Colonoscopy on 06/29/22 by Dr. [name] A fungating partially obstructing mass in mid transverse colon.   07/03/2022 Pathology Results   DIAGNOSIS: A. 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 - SEE CANCER SUMMARY BELOW. - TATTOO INK. - SEPARATE 4.5 CM LENGTH OF SMALL INTESTINE.  CANCER CASE SUMMARY: COLON AND RECTUM Standard(s): AJCC-UICC 8  SPECIMEN Procedure: Transverse colectomy  TUMOR Tumor Site: Transverse colon Histologic Type: Adenocarcinoma Histologic Grade: G2, moderately differentiated Tumor Size: Greatest dimension: 6.1 cm Tumor Extent: Directly invades or adheres to adjacent structures (small intestine) Macroscopic Tumor Perforation: Not identified Lymphatic and/or Vascular Invasion: Large vessel (venous), extramural Perineural Invasion:  Present Tumor Budding Score: High (10 or more) Treatment Effect: No known presurgical therapy  MARGINS Margin Status for Invasive Carcinoma: All margins negative for invasive carcinoma      Margins examined: Proximal, distal, and mesenteric Margin Status for Non-Invasive Tumor: All margins negative for high-grade dysplasia/intramucosal carcinoma and low-grade dysplasia  REGIONAL LYMPH NODES Regional Lymph Nodes: Regional lymph nodes present      Tumor present in regional lymph node(s)           Number of Lymph Nodes with Tumor: 1      Number of Lymph Nodes Examined: 24  Tumor Deposits: Not identified  DISTANT METASTASES Distant Site(s) Involved, if applicable: Not applicable  PATHOLOGIC STAGE CLASSIFICATION (pTNM, AJCC 8th Edition): Modified Classification: Not applicable pT4b      T suffix: Not applicable pN1a pM not applicable - pM cannot be determined from the submitted specimen(s)    07/03/2022 Procedure   Exploratory laparotomy, en bloc colon resection, reanastomosis, primary repair of parastomal hernia by Dr. [name]     07/12/2022 Cancer Staging   Staging form: Colon and Rectum, AJCC 8th Edition - Clinical: Stage IIIC (cT4b, cN1a, cM0) - Signed by [name], MD on 07/12/2022 Stage prefix: Initial diagnosis Total positive nodes: 1 Total nodes examined: 24   08/12/2022 -  Chemotherapy   Patient is on Treatment Plan : COLORECTAL FOLFOX q14d x 6 months       MEDICAL HISTORY:  Past Medical History:  Diagnosis Date  Colon cancer (HCC)    remission 2011   Heart murmur    as a child from rheumatic fever   Malignant neoplasm of transverse colon (HCC) 2024   Microcytic anemia    Neuropathy    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HCC)    Protein-calorie malnutrition, severe (HCC)     SURGICAL HISTORY: Past Surgical History:  Procedure Laterality Date   APPENDECTOMY     COLECTOMY WITH COLOSTOMY CREATION/HARTMANN PROCEDURE N/A 07/03/2022   Procedure: COLECTOMY WITH  COLOSTOMY CREATION/HARTMANN PROCEDURE;  Surgeon: Sung Amabile, DO;  Location: [facility] ORS;  Service: General;  Laterality: N/A;   COLONOSCOPY N/A 06/29/2022   Procedure: COLONOSCOPY;  Surgeon: Sung Amabile, DO;  Location: [facility] ENDOSCOPY;  Service: General;  Laterality: N/A;   PORTACATH PLACEMENT Right 08/08/2022   Procedure: INSERTION PORT-A-CATH;  Surgeon: Sung Amabile, DO;  Location: [facility] ORS;  Service: General;  Laterality: Right;    SOCIAL HISTORY: Social History   Socioeconomic History   Marital status: Single    Spouse name: Not on file   Number of children: Not on file   Years of education: Not on file   Highest education level: Not on file  Occupational History   Not on file  Tobacco Use   Smoking status: Never   Smokeless tobacco: Never  Vaping Use   Vaping Use: Never used  Substance and Sexual Activity   Alcohol use: Not Currently   Drug use: Never   Sexual activity: Not on file  Other Topics Concern   Not on file  Social History Narrative   Not on file   Social Determinants of Health   Financial Resource Strain: Not on file  Food Insecurity: Unknown (06/29/2022)   Hunger Vital Sign    Worried About Running Out of Food in the Last Year: Never true    Ran Out of Food in the Last Year: Not on file  Transportation Needs: No Transportation Needs (06/29/2022)   PRAPARE - Administrator, Civil Service (Medical): No    Lack of Transportation (Non-Medical): No  Physical Activity: Not on file  Stress: Not on file  Social Connections: Not on file  Intimate Partner Violence: Not At Risk (06/29/2022)   Humiliation, Afraid, Rape, and Kick questionnaire    Fear of Current or Ex-Partner: No    Emotionally Abused: No    Physically Abused: No    Sexually Abused: No    FAMILY HISTORY: History reviewed. No pertinent family history.  ALLERGIES:  has No Known Allergies.  MEDICATIONS:  Current Outpatient Medications  Medication Sig Dispense Refill   dexamethasone  (DECADRON) 4 MG tablet Take 1 tablet (4 mg total) by mouth daily. Start the day after chemotherapy for 2 days. Take with food. 30 tablet 1   Docusate Sodium (DSS) 100 MG CAPS Take 1 capsule by mouth 2 (two) times daily. As needed for mild constipation.     Ferrous Sulfate (IRON PO) Take 1 tablet by mouth 3 (three) times a week.     HYDROcodone-acetaminophen (NORCO) 5-325 MG tablet Take 1 tablet by mouth every 6 (six) hours as needed for up to 30 doses for moderate pain. 30 tablet 0   ibuprofen (ADVIL) 800 MG tablet 800 mg.     lidocaine-prilocaine (EMLA) cream Apply to affected area once 30 g 3   Multiple Vitamin (MULTIVITAMIN) tablet Take 1 tablet by mouth daily.     ondansetron (ZOFRAN) 8 MG tablet Take 1 tablet (8 mg total) by mouth every 8 (eight)  hours as needed for nausea or vomiting. Start on the third day after chemotherapy. 30 tablet 1   prochlorperazine (COMPAZINE) 10 MG tablet Take 1 tablet (10 mg total) by mouth every 6 (six) hours as needed for nausea or vomiting. 30 tablet 1   zolpidem (AMBIEN) 5 MG tablet Take 1 tablet (5 mg total) by mouth at bedtime as needed for sleep. 30 tablet 0   gabapentin (NEURONTIN) 100 MG capsule Take 2 capsules (200 mg total) by mouth 3 (three) times daily. 180 capsule 0   ondansetron (ZOFRAN) 4 MG tablet Take 4 mg by mouth every 8 (eight) hours as needed for nausea or vomiting.     potassium chloride SA (KLOR-CON M) 20 MEQ tablet Take 1 tablet (20 mEq total) by mouth daily for 3 days. (Patient not taking: Reported on 12/02/2022) 3 tablet 0   No current facility-administered medications for this visit.   Facility-Administered Medications Ordered in Other Visits  Medication Dose Route Frequency Provider Last Rate Last Admin   fluorouracil (ADRUCIL) 5,000 mg in sodium chloride 0.9 % 150 mL chemo infusion  2,400 mg/m2 (Treatment Plan Recorded) Intravenous 1 day or 1 dose [name], MD   Infusion Verify at 12/17/22 1524    REVIEW OF SYSTEMS:    Pertinent information mentioned in HPI All other systems were reviewed with the patient and are negative.  PHYSICAL EXAMINATION: ECOG PERFORMANCE STATUS: 1 - Symptomatic but completely ambulatory  Vitals:   12/17/22 1328 12/17/22 1330  BP: (!) 169/96 (!) 149/93  Pulse: 72   Temp: 98.2 F (36.8 C)   SpO2: 99%     Filed Weights   12/17/22 1328  Weight: 176 lb 8 oz (80.1 kg)     GENERAL:alert, no distress and comfortable SKIN: skin color, texture, turgor are normal, no rashes or significant lesions EYES: normal, conjunctiva are pink and non-injected, sclera clear OROPHARYNX:no exudate, no erythema and lips, buccal mucosa, and tongue normal  NECK: supple, thyroid normal size, non-tender, without nodularity LYMPH:  no palpable lymphadenopathy in the cervical, axillary or inguinal LUNGS: clear to auscultation and percussion with normal breathing effort HEART: regular rate & rhythm and no murmurs and no lower extremity edema ABDOMEN:abdomen soft, non-tender and normal bowel sounds Musculoskeletal:no cyanosis of digits and no clubbing  PSYCH: alert & oriented x 3 with fluent speech NEURO: no focal motor/sensory deficits  LABORATORY DATA:  I have reviewed the data as listed Lab Results  Component Value Date   WBC 5.9 12/17/2022   HGB 11.4 (L) 12/17/2022   HCT 33.1 (L) 12/17/2022   MCV 98.5 12/17/2022   PLT 127 (L) 12/17/2022   Recent Labs    11/18/22 0816 12/02/22 0925 12/17/22 1256  NA 136 138 135  K 3.6 3.7 4.0  CL 108 106 104  CO2 23 22 25   GLUCOSE 109* 134* 87  BUN 19 15 15   CREATININE 0.96 0.90 0.98  CALCIUM 8.7* 8.9 8.6*  GFRNONAA >60 >60 >60  PROT 6.6 6.8 6.6  ALBUMIN 3.7 3.8 3.8  AST 32 31 27  ALT 24 21 21   ALKPHOS 80 68 74  BILITOT 0.5 0.7 0.8    RADIOGRAPHIC STUDIES: I have personally reviewed the radiological images as listed and agreed with the findings in the report. No results found.

## 2022-12-17 NOTE — Patient Instructions (Signed)
[facility]  Discharge Instructions: Thank you for choosing [facility] to provide your oncology and hematology care.  If you have a lab appointment with the Cancer Center, please go directly to the Cancer Center and check in at the registration area.  Wear comfortable clothing and clothing appropriate for easy access to any Portacath or PICC line.   We strive to give you quality time with your provider. You may need to reschedule your appointment if you arrive late (15 or more minutes).  Arriving late affects you and other patients whose appointments are after yours.  Also, if you miss three or more appointments without notifying the office, you may be dismissed from the clinic at the provider's discretion.      For prescription refill requests, have your pharmacy contact our office and allow 72 hours for refills to be completed.    Today you received the following chemotherapy and/or immunotherapy agents LEUCOVORIN and 5 FU      To help prevent nausea and vomiting after your treatment, we encourage you to take your nausea medication as directed.  BELOW ARE SYMPTOMS THAT SHOULD BE REPORTED IMMEDIATELY: *FEVER GREATER THAN 100.4 F (38 C) OR HIGHER *CHILLS OR SWEATING *NAUSEA AND VOMITING THAT IS NOT CONTROLLED WITH YOUR NAUSEA MEDICATION *UNUSUAL SHORTNESS OF BREATH *UNUSUAL BRUISING OR BLEEDING *URINARY PROBLEMS (pain or burning when urinating, or frequent urination) *BOWEL PROBLEMS (unusual diarrhea, constipation, pain near the anus) TENDERNESS IN MOUTH AND THROAT WITH OR WITHOUT PRESENCE OF ULCERS (sore throat, sores in mouth, or a toothache) UNUSUAL RASH, SWELLING OR PAIN  UNUSUAL VAGINAL DISCHARGE OR ITCHING   Items with * indicate a potential emergency and should be followed up as soon as possible or go to the Emergency Department if any problems should occur.  Please show the CHEMOTHERAPY ALERT CARD or IMMUNOTHERAPY ALERT CARD at  check-in to the Emergency Department and triage nurse.  Should you have questions after your visit or need to cancel or reschedule your appointment, please contact [facility]  [phone] and follow the prompts.  Office hours are 8:00 a.m. to 4:30 p.m. Monday - Friday. Please note that voicemails left after 4:00 p.m. may not be returned until the following business day.  We are closed weekends and major holidays. You have access to a nurse at all times for urgent questions. Please call the main number to the clinic [phone] and follow the prompts.  For any non-urgent questions, you may also contact your provider using MyChart. We now offer e-Visits for anyone 64 and older to request care online for non-urgent symptoms. For details visit [email or web address].   Also download the MyChart app! Go to the app store, search "MyChart", open the app, select [place], and log in with your MyChart username and password.  Leucovorin Injection What is this medication? LEUCOVORIN (loo koe VOR in) prevents side effects from certain medications, such as methotrexate. It works by increasing folate levels. This helps protect healthy cells in your body. It may also be used to treat anemia caused by low levels of folate. It can also be used with fluorouracil, a type of chemotherapy, to treat colorectal cancer. It works by increasing the effects of fluorouracil in the body. This medicine may be used for other purposes; ask your health care provider or pharmacist if you have questions. What should I tell my care team before I take this medication? They need to know if you  have any of these conditions: Anemia from low levels of vitamin B12 in the blood An unusual or allergic reaction to leucovorin, folic acid, other medications, foods, dyes, or preservatives Pregnant or trying to get pregnant Breastfeeding How should I use this medication? This medication is injected into a  vein or a muscle. It is given by your care team in a hospital or clinic setting. Talk to your care team about the use of this medication in children. Special care may be needed. Overdosage: If you think you have taken too much of this medicine contact a poison control center or emergency room at once. NOTE: This medicine is only for you. Do not share this medicine with others. What if I miss a dose? Keep appointments for follow-up doses. It is important not to miss your dose. Call your care team if you are unable to keep an appointment. What may interact with this medication? Capecitabine Fluorouracil Phenobarbital Phenytoin Primidone Trimethoprim;sulfamethoxazole This list may not describe all possible interactions. Give your health care provider a list of all the medicines, herbs, non-prescription drugs, or dietary supplements you use. Also tell them if you smoke, drink alcohol, or use illegal drugs. Some items may interact with your medicine. What should I watch for while using this medication? Your condition will be monitored carefully while you are receiving this medication. This medication may increase the side effects of 5-fluorouracil. Tell your care team if you have diarrhea or mouth sores that do not get better or that get worse. What side effects may I notice from receiving this medication? Side effects that you should report to your care team as soon as possible: Allergic reactions--skin rash, itching, hives, swelling of the face, lips, tongue, or throat This list may not describe all possible side effects. Call your doctor for medical advice about side effects. You may report side effects to FDA at 1-800-FDA-1088. Where should I keep my medication? This medication is given in a hospital or clinic. It will not be stored at home. NOTE: This sheet is a summary. It may not cover all possible information. If you have questions about this medicine, talk to your doctor, pharmacist, or  health care provider.  2024 Elsevier/Gold Standard (2021-12-11 00:00:00)  Fluorouracil Injection What is this medication? FLUOROURACIL (flure oh YOOR a sil) treats some types of cancer. It works by slowing down the growth of cancer cells. This medicine may be used for other purposes; ask your health care provider or pharmacist if you have questions. COMMON BRAND NAME(S): Adrucil What should I tell my care team before I take this medication? They need to know if you have any of these conditions: Blood disorders Dihydropyrimidine dehydrogenase (DPD) deficiency Infection, such as chickenpox, cold sores, herpes Kidney disease Liver disease Poor nutrition Recent or ongoing radiation therapy An unusual or allergic reaction to fluorouracil, other medications, foods, dyes, or preservatives If you or your partner are pregnant or trying to get pregnant Breast-feeding How should I use this medication? This medication is injected into a vein. It is administered by your care team in a hospital or clinic setting. Talk to your care team about the use of this medication in children. Special care may be needed. Overdosage: If you think you have taken too much of this medicine contact a poison control center or emergency room at once. NOTE: This medicine is only for you. Do not share this medicine with others. What if I miss a dose? Keep appointments for follow-up doses. It is important  not to miss your dose. Call your care team if you are unable to keep an appointment. What may interact with this medication? Do not take this medication with any of the following: Live virus vaccines This medication may also interact with the following: Medications that treat or prevent blood clots, such as warfarin, enoxaparin, dalteparin This list may not describe all possible interactions. Give your health care provider a list of all the medicines, herbs, non-prescription drugs, or dietary supplements you use. Also  tell them if you smoke, drink alcohol, or use illegal drugs. Some items may interact with your medicine. What should I watch for while using this medication? Your condition will be monitored carefully while you are receiving this medication. This medication may make you feel generally unwell. This is not uncommon as chemotherapy can affect healthy cells as well as cancer cells. Report any side effects. Continue your course of treatment even though you feel ill unless your care team tells you to stop. In some cases, you may be given additional medications to help with side effects. Follow all directions for their use. This medication may increase your risk of getting an infection. Call your care team for advice if you get a fever, chills, sore throat, or other symptoms of a cold or flu. Do not treat yourself. Try to avoid being around people who are sick. This medication may increase your risk to bruise or bleed. Call your care team if you notice any unusual bleeding. Be careful brushing or flossing your teeth or using a toothpick because you may get an infection or bleed more easily. If you have any dental work done, tell your dentist you are receiving this medication. Avoid taking medications that contain aspirin, acetaminophen, ibuprofen, naproxen, or ketoprofen unless instructed by your care team. These medications may hide a fever. Do not treat diarrhea with over the counter products. Contact your care team if you have diarrhea that lasts more than 2 days or if it is severe and watery. This medication can make you more sensitive to the sun. Keep out of the sun. If you cannot avoid being in the sun, wear protective clothing and sunscreen. Do not use sun lamps, tanning beds, or tanning booths. Talk to your care team if you or your partner wish to become pregnant or think you might be pregnant. This medication can cause serious birth defects if taken during pregnancy and for 3 months after the last dose.  A reliable form of contraception is recommended while taking this medication and for 3 months after the last dose. Talk to your care team about effective forms of contraception. Do not father a child while taking this medication and for 3 months after the last dose. Use a condom while having sex during this time period. Do not breastfeed while taking this medication. This medication may cause infertility. Talk to your care team if you are concerned about your fertility. What side effects may I notice from receiving this medication? Side effects that you should report to your care team as soon as possible: Allergic reactions--skin rash, itching, hives, swelling of the face, lips, tongue, or throat Heart attack--pain or tightness in the chest, shoulders, arms, or jaw, nausea, shortness of breath, cold or clammy skin, feeling faint or lightheaded Heart failure--shortness of breath, swelling of the ankles, feet, or hands, sudden weight gain, unusual weakness or fatigue Heart rhythm changes--fast or irregular heartbeat, dizziness, feeling faint or lightheaded, chest pain, trouble breathing High ammonia level--unusual weakness or fatigue,  confusion, loss of appetite, nausea, vomiting, seizures Infection--fever, chills, cough, sore throat, wounds that don't heal, pain or trouble when passing urine, general feeling of discomfort or being unwell Low red blood cell level--unusual weakness or fatigue, dizziness, headache, trouble breathing Pain, tingling, or numbness in the hands or feet, muscle weakness, change in vision, confusion or trouble speaking, loss of balance or coordination, trouble walking, seizures Redness, swelling, and blistering of the skin over hands and feet Severe or prolonged diarrhea Unusual bruising or bleeding Side effects that usually do not require medical attention (report to your care team if they continue or are bothersome): Dry skin Headache Increased tears Nausea Pain,  redness, or swelling with sores inside the mouth or throat Sensitivity to light Vomiting This list may not describe all possible side effects. Call your doctor for medical advice about side effects. You may report side effects to FDA at 1-800-FDA-1088. Where should I keep my medication? This medication is given in a hospital or clinic. It will not be stored at home. NOTE: This sheet is a summary. It may not cover all possible information. If you have questions about this medicine, talk to your doctor, pharmacist, or health care provider.  2024 Elsevier/Gold Standard (2021-11-13 00:00:00)

## 2022-12-19 ENCOUNTER — Inpatient Hospital Stay: Payer: PPO

## 2022-12-19 ENCOUNTER — Other Ambulatory Visit: Payer: Self-pay

## 2022-12-19 VITALS — BP 165/93 | HR 59 | Resp 18

## 2022-12-19 DIAGNOSIS — Z5111 Encounter for antineoplastic chemotherapy: Secondary | ICD-10-CM | POA: Diagnosis not present

## 2022-12-19 DIAGNOSIS — C184 Malignant neoplasm of transverse colon: Secondary | ICD-10-CM

## 2022-12-19 MED ORDER — SODIUM CHLORIDE 0.9% FLUSH
10.0000 mL | INTRAVENOUS | Status: DC | PRN
  Administered 2022-12-19: 10 mL
  Filled 2022-12-19: qty 10

## 2022-12-19 MED ORDER — HEPARIN SOD (PORK) LOCK FLUSH 100 UNIT/ML IV SOLN
500.0000 [IU] | Freq: Once | INTRAVENOUS | Status: AC | PRN
  Administered 2022-12-19: 500 [IU]
  Filled 2022-12-19: qty 5

## 2022-12-25 ENCOUNTER — Other Ambulatory Visit: Payer: Self-pay | Admitting: Internal Medicine

## 2022-12-25 MED ORDER — HYDROCODONE-ACETAMINOPHEN 5-325 MG PO TABS: 1.0000 | ORAL_TABLET | Freq: Four times a day (QID) | ORAL | 0 refills | Status: DC | PRN

## 2022-12-27 MED FILL — Dexamethasone Sodium Phosphate Inj 100 MG/10ML: INTRAMUSCULAR | Qty: 1 | Status: AC

## 2022-12-30 ENCOUNTER — Inpatient Hospital Stay (HOSPITAL_BASED_OUTPATIENT_CLINIC_OR_DEPARTMENT_OTHER): Payer: PPO | Admitting: Internal Medicine

## 2022-12-30 ENCOUNTER — Encounter: Payer: Self-pay | Admitting: Internal Medicine

## 2022-12-30 ENCOUNTER — Other Ambulatory Visit: Payer: Self-pay

## 2022-12-30 ENCOUNTER — Inpatient Hospital Stay: Payer: PPO

## 2022-12-30 ENCOUNTER — Inpatient Hospital Stay: Payer: PPO | Attending: Internal Medicine

## 2022-12-30 VITALS — BP 127/83 | HR 76 | Temp 98.1°F | Wt 179.4 lb

## 2022-12-30 DIAGNOSIS — Z933 Colostomy status: Secondary | ICD-10-CM | POA: Diagnosis not present

## 2022-12-30 DIAGNOSIS — C184 Malignant neoplasm of transverse colon: Secondary | ICD-10-CM | POA: Diagnosis not present

## 2022-12-30 DIAGNOSIS — T451X5A Adverse effect of antineoplastic and immunosuppressive drugs, initial encounter: Secondary | ICD-10-CM

## 2022-12-30 DIAGNOSIS — Z79899 Other long term (current) drug therapy: Secondary | ICD-10-CM | POA: Insufficient documentation

## 2022-12-30 DIAGNOSIS — Z5111 Encounter for antineoplastic chemotherapy: Secondary | ICD-10-CM | POA: Insufficient documentation

## 2022-12-30 DIAGNOSIS — Z95828 Presence of other vascular implants and grafts: Secondary | ICD-10-CM

## 2022-12-30 DIAGNOSIS — R5383 Other fatigue: Secondary | ICD-10-CM

## 2022-12-30 DIAGNOSIS — D6481 Anemia due to antineoplastic chemotherapy: Secondary | ICD-10-CM | POA: Diagnosis not present

## 2022-12-30 DIAGNOSIS — Z452 Encounter for adjustment and management of vascular access device: Secondary | ICD-10-CM | POA: Diagnosis not present

## 2022-12-30 LAB — CBC WITH DIFFERENTIAL/PLATELET
Abs Immature Granulocytes: 0.03 10*3/uL (ref 0.00–0.07)
Basophils Absolute: 0.1 10*3/uL (ref 0.0–0.1)
Basophils Relative: 1 %
Eosinophils Absolute: 0.2 10*3/uL (ref 0.0–0.5)
Eosinophils Relative: 3 %
HCT: 33.3 % — ABNORMAL LOW (ref 39.0–52.0)
Hemoglobin: 11.5 g/dL — ABNORMAL LOW (ref 13.0–17.0)
Immature Granulocytes: 0 %
Lymphocytes Relative: 30 %
Lymphs Abs: 2.1 10*3/uL (ref 0.7–4.0)
MCH: 34.7 pg — ABNORMAL HIGH (ref 26.0–34.0)
MCHC: 34.5 g/dL (ref 30.0–36.0)
MCV: 100.6 fL — ABNORMAL HIGH (ref 80.0–100.0)
Monocytes Absolute: 0.8 10*3/uL (ref 0.1–1.0)
Monocytes Relative: 11 %
Neutro Abs: 3.8 10*3/uL (ref 1.7–7.7)
Neutrophils Relative %: 55 %
Platelets: 153 10*3/uL (ref 150–400)
RBC: 3.31 MIL/uL — ABNORMAL LOW (ref 4.22–5.81)
RDW: 17 % — ABNORMAL HIGH (ref 11.5–15.5)
WBC: 6.9 10*3/uL (ref 4.0–10.5)
nRBC: 0 % (ref 0.0–0.2)

## 2022-12-30 LAB — COMPREHENSIVE METABOLIC PANEL
ALT: 15 U/L (ref 0–44)
AST: 22 U/L (ref 15–41)
Albumin: 3.7 g/dL (ref 3.5–5.0)
Alkaline Phosphatase: 80 U/L (ref 38–126)
Anion gap: 7 (ref 5–15)
BUN: 14 mg/dL (ref 8–23)
CO2: 23 mmol/L (ref 22–32)
Calcium: 8.7 mg/dL — ABNORMAL LOW (ref 8.9–10.3)
Chloride: 109 mmol/L (ref 98–111)
Creatinine, Ser: 1 mg/dL (ref 0.61–1.24)
GFR, Estimated: >60 mL/min (ref >60)
Glucose, Bld: 106 mg/dL — ABNORMAL HIGH (ref 70–99)
Potassium: 3.9 mmol/L (ref 3.5–5.1)
Sodium: 139 mmol/L (ref 135–145)
Total Bilirubin: 0.7 mg/dL (ref 0.3–1.2)
Total Protein: 6.8 g/dL (ref 6.5–8.1)

## 2022-12-30 MED ORDER — HYDROCODONE-ACETAMINOPHEN 5-325 MG PO TABS: 1.0000 | ORAL_TABLET | Freq: Four times a day (QID) | ORAL | 0 refills | Status: DC | PRN

## 2022-12-30 MED ORDER — HEPARIN SOD (PORK) LOCK FLUSH 100 UNIT/ML IV SOLN
500.0000 [IU] | Freq: Once | INTRAVENOUS | Status: AC
  Filled 2022-12-30: qty 5

## 2022-12-30 NOTE — Progress Notes (Signed)
Patient is feeling weak and tired, with very little energy. He stated that over the weekend all he did was stay in bed because he felt like crap. Also, the pharmacy they use never received the Hydrocodone script that was supposed to be put in on 12/25/2022.

## 2022-12-30 NOTE — Telephone Encounter (Signed)
Dr. Alena Bills sent over the prescription again.

## 2022-12-30 NOTE — Progress Notes (Addendum)
[facility] CONSULT NOTE  Patient Care Team: [name], MD as PCP - General (Family Medicine) [name], RN as Oncology Nurse Navigator   CANCER STAGING   Cancer Staging  Cancer of transverse colon [facility]) Staging form: Colon and Rectum, AJCC 8th Edition - Clinical: Stage IIIC (cT4b, cN1a, cM0) - Signed by [name], MD on 07/12/2022 Stage prefix: Initial diagnosis Total positive nodes: 1 Total nodes examined: 24   ASSESSMENT & PLAN:  [name] 72 y.o. male with pmh of colon cancer s/p left sided colostomy in 2011, chemotherapy, neuropathy follows with Medical Oncology for Stage IIIC colon cancer.   #Transverse colon adenocarcinoma, Stage IIIC ([id number]), MMR proficient - presented with bowel obstruction. S/p resection and reanastomosis with Dr. [name] on 12/13. CT imaging negative for metastasis.  -Oxaliplatin held with cycle 4 due to worsening cold induced neuropathy.  Neuropathy has improved -> resumed oxaliplatin 50 mg/m with cycle 6.  Oxaliplatin permanently discontinued with cycle 8.    -CT chest abdomen pelvis (11/12/22) NED.  Plan to repeat CT imaging in 6 months.  Repeat colonoscopy due in December 2024.  -Patient here for cycle 11 of 5-FU/leucovorin. he reports extreme fatigue.  Inability to do his daily activities.  Labs are within the range.  However due to extreme fatigue, we agreed to delay the treatment by 1 week and reassess.  Deaccessed port.  Follow-up in 1 week.   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  Oxaliplatin discontinued -Stable since discontinuation of oxaliplatin.  # Insomnia -Taking Ambien 5 mg as needed.  Helping better than melatonin.  # Arthritis -Patient has multiple arthritic joints including shoulders or hips.  Worsened with chemotherapy -Takes ibuprofen in the morning and Norco at night.   -Refilled Norco 5-325 milligram every 6 as needed  # Iron  deficiency anemia - s/p IV Venofer x 2 doses.  Continue with iron pills.  #History of colon cancer s/p left sided colostomy, chemo in 2011 -Surgery and had adjuvant chemotherapy.  No records are available.  # Access -port placed on 08/08/2022 by Dr. [name]  Orders Placed This Encounter  Procedures   CBC with Differential    Standing Status:   Future    Standing Expiration Date:   01/06/2024   Comprehensive metabolic panel    Standing Status:   Future    Standing Expiration Date:   01/06/2024   RTC in 1 week for MD visit, labs, cycle eleven 5-FU/leucovorin.  The total time spent in the appointment was 30 minutes encounter with patients including review of chart and various tests results, discussions about plan of care and coordination of care plan   All questions were answered. The patient knows to call the clinic with any problems, questions or concerns. No barriers to learning was detected.  [name], MD 6/10/20249:38 AM   HISTORY OF PRESENTING ILLNESS:  [name] 72 y.o. male with pmh of colon cancer s/p left sided colostomy in 2011, chemotherapy, neuropathy follows with Medical Oncology for Stage IIIC colon cancer.   Interval history Patient in today accompanied with wife prior to cycle 11 of 5-FU/leucovorin.  Patient reports extreme fatigue with his treatments.  Has mild nausea and is using Zofran as needed.  Reports diffuse joint aches.  Requesting for Norco refill.  Neuropathy is stable since discontinuation of oxaliplatin.  Getting poor sleep at night despite using Ambien.  Since start of the chemo, has episodes of runny bloody nose  at times on the right side.  I have reviewed his chart and materials related to his cancer extensively and collaborated history with the patient. Summary of oncologic history is as follows: Oncology History Overview Note  Patient has a history of colon cancer in 2011 treated with Dr. [name] with radiation followed by surgery s/p left  sided colostomy and adjuvant chemo likely FOLFOX. Records unavailable.    Cancer of transverse colon (HCC)  06/29/2022 Initial Diagnosis   Presented to ED for abdominal pain, nausea and vomiting.    Imaging   CT abdo pelvis IMPRESSION: 1. Patient is status post abdominoperineal resection with left lower quadrant ostomy. The ascending and transverse colon are moderately distended and there is abrupt caliber change at the distal transverse colon/splenic flexure where there is irregular masslike thickening. Findings are suspicious for colonic obstruction due to a mass at this location. There is no dilated small bowel. 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 2. Slightly thick-walled presacral fluid collection with peripheral calcification, may reflect chronic postoperative collection. 3. Gallstones. 4. Aortic atherosclerosis.  CT chest IMPRESSION: 1. No acute intrathoracic process. No evidence of intrathoracic metastases.    Procedure   Colonoscopy on 06/29/22 by Dr. [name] A fungating partially obstructing mass in mid transverse colon.   07/03/2022 Pathology Results   DIAGNOSIS: A. 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 - SEE CANCER SUMMARY BELOW. - TATTOO INK. - SEPARATE 4.5 CM LENGTH OF SMALL INTESTINE.  CANCER CASE SUMMARY: COLON AND RECTUM Standard(s): AJCC-UICC 8  SPECIMEN Procedure: Transverse colectomy  TUMOR Tumor Site: Transverse colon Histologic Type: Adenocarcinoma Histologic Grade: G2, moderately differentiated Tumor Size: Greatest dimension: 6.1 cm Tumor Extent: Directly invades or adheres to adjacent structures (small intestine) Macroscopic Tumor Perforation: Not identified Lymphatic and/or Vascular Invasion: Large vessel (venous), extramural Perineural Invasion: Present Tumor Budding Score: High (10 or  more) Treatment Effect: No known presurgical therapy  MARGINS Margin Status for Invasive Carcinoma: All margins negative for invasive carcinoma      Margins examined: Proximal, distal, and mesenteric Margin Status for Non-Invasive Tumor: All margins negative for high-grade dysplasia/intramucosal carcinoma and low-grade dysplasia  REGIONAL LYMPH NODES Regional Lymph Nodes: Regional lymph nodes present      Tumor present in regional lymph node(s)           Number of Lymph Nodes with Tumor: 1      Number of Lymph Nodes Examined: 24  Tumor Deposits: Not identified  DISTANT METASTASES Distant Site(s) Involved, if applicable: Not applicable  PATHOLOGIC STAGE CLASSIFICATION (pTNM, AJCC 8th Edition): Modified Classification: Not applicable pT4b      T suffix: Not applicable pN1a pM not applicable - pM cannot be determined from the submitted specimen(s)    07/03/2022 Procedure   Exploratory laparotomy, en bloc colon resection, reanastomosis, primary repair of parastomal hernia by Dr. [name]     07/12/2022 Cancer Staging   Staging form: Colon and Rectum, AJCC 8th Edition - Clinical: Stage IIIC (cT4b, cN1a, cM0) - Signed by [name], MD on 07/12/2022 Stage prefix: Initial diagnosis Total positive nodes: 1 Total nodes examined: 24   08/12/2022 -  Chemotherapy   Patient is on Treatment Plan : COLORECTAL FOLFOX q14d x 6 months       MEDICAL HISTORY:  Past Medical History:  Diagnosis Date   Colon cancer (HCC)    remission 2011   Heart murmur    as a child  from rheumatic fever   Malignant neoplasm of transverse colon (HCC) 2024   Microcytic anemia    Neuropathy    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HCC)    Protein-calorie malnutrition, severe (HCC)     SURGICAL HISTORY: Past Surgical History:  Procedure Laterality Date   APPENDECTOMY     COLECTOMY WITH COLOSTOMY CREATION/HARTMANN PROCEDURE N/A 07/03/2022   Procedure: COLECTOMY WITH COLOSTOMY CREATION/HARTMANN PROCEDURE;   Surgeon: Sung Amabile, DO;  Location: [facility] ORS;  Service: General;  Laterality: N/A;   COLONOSCOPY N/A 06/29/2022   Procedure: COLONOSCOPY;  Surgeon: Sung Amabile, DO;  Location: [facility] ENDOSCOPY;  Service: General;  Laterality: N/A;   PORTACATH PLACEMENT Right 08/08/2022   Procedure: INSERTION PORT-A-CATH;  Surgeon: Sung Amabile, DO;  Location: [facility] ORS;  Service: General;  Laterality: Right;    SOCIAL HISTORY: Social History   Socioeconomic History   Marital status: Single    Spouse name: Not on file   Number of children: Not on file   Years of education: Not on file   Highest education level: Not on file  Occupational History   Not on file  Tobacco Use   Smoking status: Never   Smokeless tobacco: Never  Vaping Use   Vaping Use: Never used  Substance and Sexual Activity   Alcohol use: Not Currently   Drug use: Never   Sexual activity: Not on file  Other Topics Concern   Not on file  Social History Narrative   Not on file   Social Determinants of Health   Financial Resource Strain: Not on file  Food Insecurity: Unknown (06/29/2022)   Hunger Vital Sign    Worried About Running Out of Food in the Last Year: Never true    Ran Out of Food in the Last Year: Not on file  Transportation Needs: No Transportation Needs (06/29/2022)   PRAPARE - Administrator, Civil Service (Medical): No    Lack of Transportation (Non-Medical): No  Physical Activity: Not on file  Stress: Not on file  Social Connections: Not on file  Intimate Partner Violence: Not At Risk (06/29/2022)   Humiliation, Afraid, Rape, and Kick questionnaire    Fear of Current or Ex-Partner: No    Emotionally Abused: No    Physically Abused: No    Sexually Abused: No    FAMILY HISTORY: History reviewed. No pertinent family history.  ALLERGIES:  has No Known Allergies.  MEDICATIONS:  Current Outpatient Medications  Medication Sig Dispense Refill   dexamethasone (DECADRON) 4 MG tablet Take 1 tablet (4  mg total) by mouth daily. Start the day after chemotherapy for 2 days. Take with food. 30 tablet 1   Docusate Sodium (DSS) 100 MG CAPS Take 1 capsule by mouth 2 (two) times daily. As needed for mild constipation.     Ferrous Sulfate (IRON PO) Take 1 tablet by mouth 3 (three) times a week.     ibuprofen (ADVIL) 800 MG tablet 800 mg.     lidocaine-prilocaine (EMLA) cream Apply to affected area once 30 g 3   Multiple Vitamin (MULTIVITAMIN) tablet Take 1 tablet by mouth daily.     ondansetron (ZOFRAN) 4 MG tablet Take 4 mg by mouth every 8 (eight) hours as needed for nausea or vomiting.     ondansetron (ZOFRAN) 8 MG tablet Take 1 tablet (8 mg total) by mouth every 8 (eight) hours as needed for nausea or vomiting. Start on the third day after chemotherapy. 30 tablet 1   prochlorperazine (COMPAZINE) 10 MG  tablet Take 1 tablet (10 mg total) by mouth every 6 (six) hours as needed for nausea or vomiting. 30 tablet 1   zolpidem (AMBIEN) 5 MG tablet Take 1 tablet (5 mg total) by mouth at bedtime as needed for sleep. 30 tablet 0   gabapentin (NEURONTIN) 100 MG capsule Take 2 capsules (200 mg total) by mouth 3 (three) times daily. 180 capsule 0   HYDROcodone-acetaminophen (NORCO) 5-325 MG tablet Take 1 tablet by mouth every 6 (six) hours as needed for up to 30 doses for moderate pain. 30 tablet 0   potassium chloride SA (KLOR-CON M) 20 MEQ tablet Take 1 tablet (20 mEq total) by mouth daily for 3 days. (Patient not taking: Reported on 12/02/2022) 3 tablet 0   No current facility-administered medications for this visit.    REVIEW OF SYSTEMS:   Pertinent information mentioned in HPI All other systems were reviewed with the patient and are negative.  PHYSICAL EXAMINATION: ECOG PERFORMANCE STATUS: 1 - Symptomatic but completely ambulatory  Vitals:   12/30/22 0916 12/30/22 0917  BP: (!) 170/97 127/83  Pulse: 76   Temp: 98.1 F (36.7 C)   SpO2: 100%     Filed Weights   12/30/22 0916  Weight: 179 lb 6.4  oz (81.4 kg)     GENERAL:alert, no distress and comfortable SKIN: skin color, texture, turgor are normal, no rashes or significant lesions EYES: normal, conjunctiva are pink and non-injected, sclera clear OROPHARYNX:no exudate, no erythema and lips, buccal mucosa, and tongue normal  NECK: supple, thyroid normal size, non-tender, without nodularity LYMPH:  no palpable lymphadenopathy in the cervical, axillary or inguinal LUNGS: clear to auscultation and percussion with normal breathing effort HEART: regular rate & rhythm and no murmurs and no lower extremity edema ABDOMEN:abdomen soft, non-tender and normal bowel sounds Musculoskeletal:no cyanosis of digits and no clubbing  PSYCH: alert & oriented x 3 with fluent speech NEURO: no focal motor/sensory deficits  LABORATORY DATA:  I have reviewed the data as listed Lab Results  Component Value Date   WBC 6.9 12/30/2022   HGB 11.5 (L) 12/30/2022   HCT 33.3 (L) 12/30/2022   MCV 100.6 (H) 12/30/2022   PLT 153 12/30/2022   Recent Labs    12/02/22 0925 12/17/22 1256 12/30/22 0901  NA 138 135 139  K 3.7 4.0 3.9  CL 106 104 109  CO2 22 25 23   GLUCOSE 134* 87 106*  BUN 15 15 14   CREATININE 0.90 0.98 1.00  CALCIUM 8.9 8.6* 8.7*  GFRNONAA >60 >60 >60  PROT 6.8 6.6 6.8  ALBUMIN 3.8 3.8 3.7  AST 31 27 22   ALT 21 21 15   ALKPHOS 68 74 80  BILITOT 0.7 0.8 0.7    RADIOGRAPHIC STUDIES: I have personally reviewed the radiological images as listed and agreed with the findings in the report. No results found.

## 2022-12-31 ENCOUNTER — Other Ambulatory Visit: Payer: Self-pay

## 2023-01-01 ENCOUNTER — Inpatient Hospital Stay: Payer: PPO

## 2023-01-03 MED FILL — Dexamethasone Sodium Phosphate Inj 100 MG/10ML: INTRAMUSCULAR | Qty: 1 | Status: AC

## 2023-01-06 ENCOUNTER — Inpatient Hospital Stay: Payer: PPO

## 2023-01-06 ENCOUNTER — Telehealth: Payer: Self-pay

## 2023-01-06 ENCOUNTER — Encounter: Payer: Self-pay | Admitting: Internal Medicine

## 2023-01-06 ENCOUNTER — Inpatient Hospital Stay (HOSPITAL_BASED_OUTPATIENT_CLINIC_OR_DEPARTMENT_OTHER): Payer: PPO | Admitting: Internal Medicine

## 2023-01-06 VITALS — BP 142/90 | HR 76 | Temp 98.5°F | Wt 179.4 lb

## 2023-01-06 DIAGNOSIS — C184 Malignant neoplasm of transverse colon: Secondary | ICD-10-CM

## 2023-01-06 DIAGNOSIS — R5383 Other fatigue: Secondary | ICD-10-CM | POA: Diagnosis not present

## 2023-01-06 DIAGNOSIS — C189 Malignant neoplasm of colon, unspecified: Secondary | ICD-10-CM | POA: Diagnosis not present

## 2023-01-06 DIAGNOSIS — Z5111 Encounter for antineoplastic chemotherapy: Secondary | ICD-10-CM | POA: Diagnosis not present

## 2023-01-06 DIAGNOSIS — D6481 Anemia due to antineoplastic chemotherapy: Secondary | ICD-10-CM | POA: Diagnosis not present

## 2023-01-06 DIAGNOSIS — T451X5A Adverse effect of antineoplastic and immunosuppressive drugs, initial encounter: Secondary | ICD-10-CM | POA: Diagnosis not present

## 2023-01-06 LAB — CBC WITH DIFFERENTIAL/PLATELET
Abs Immature Granulocytes: 0.14 10*3/uL — ABNORMAL HIGH (ref 0.00–0.07)
Basophils Absolute: 0.1 10*3/uL (ref 0.0–0.1)
Basophils Relative: 2 %
Eosinophils Absolute: 0.4 10*3/uL (ref 0.0–0.5)
Eosinophils Relative: 7 %
HCT: 33.8 % — ABNORMAL LOW (ref 39.0–52.0)
Hemoglobin: 11.4 g/dL — ABNORMAL LOW (ref 13.0–17.0)
Immature Granulocytes: 3 %
Lymphocytes Relative: 39 %
Lymphs Abs: 2.2 10*3/uL (ref 0.7–4.0)
MCH: 34.4 pg — ABNORMAL HIGH (ref 26.0–34.0)
MCHC: 33.7 g/dL (ref 30.0–36.0)
MCV: 102.1 fL — ABNORMAL HIGH (ref 80.0–100.0)
Monocytes Absolute: 0.9 10*3/uL (ref 0.1–1.0)
Monocytes Relative: 16 %
Neutro Abs: 1.8 10*3/uL (ref 1.7–7.7)
Neutrophils Relative %: 33 %
Platelets: 193 10*3/uL (ref 150–400)
RBC: 3.31 MIL/uL — ABNORMAL LOW (ref 4.22–5.81)
RDW: 15.6 % — ABNORMAL HIGH (ref 11.5–15.5)
WBC: 5.5 10*3/uL (ref 4.0–10.5)
nRBC: 0 % (ref 0.0–0.2)

## 2023-01-06 LAB — COMPREHENSIVE METABOLIC PANEL
ALT: 15 U/L (ref 0–44)
AST: 23 U/L (ref 15–41)
Albumin: 3.6 g/dL (ref 3.5–5.0)
Alkaline Phosphatase: 99 U/L (ref 38–126)
Anion gap: 8 (ref 5–15)
BUN: 13 mg/dL (ref 8–23)
CO2: 22 mmol/L (ref 22–32)
Calcium: 8.8 mg/dL — ABNORMAL LOW (ref 8.9–10.3)
Chloride: 107 mmol/L (ref 98–111)
Creatinine, Ser: 0.92 mg/dL (ref 0.61–1.24)
GFR, Estimated: >60 mL/min (ref >60)
Glucose, Bld: 124 mg/dL — ABNORMAL HIGH (ref 70–99)
Potassium: 3.6 mmol/L (ref 3.5–5.1)
Sodium: 137 mmol/L (ref 135–145)
Total Bilirubin: 0.6 mg/dL (ref 0.3–1.2)
Total Protein: 6.3 g/dL — ABNORMAL LOW (ref 6.5–8.1)

## 2023-01-06 MED ORDER — SODIUM CHLORIDE 0.9 % IV SOLN
10.0000 mg | Freq: Once | INTRAVENOUS | Status: AC
  Administered 2023-01-06: 10 mg via INTRAVENOUS
  Filled 2023-01-06: qty 1
  Filled 2023-01-06: qty 10

## 2023-01-06 MED ORDER — SODIUM CHLORIDE 0.9 % IV SOLN
400.0000 mg/m2 | Freq: Once | INTRAVENOUS | Status: AC
  Administered 2023-01-06: 752 mg via INTRAVENOUS
  Filled 2023-01-06: qty 37.6

## 2023-01-06 MED ORDER — SODIUM CHLORIDE 0.9 % IV SOLN
INTRAVENOUS | Status: DC | PRN
  Filled 2023-01-06: qty 250

## 2023-01-06 MED ORDER — SODIUM CHLORIDE 0.9 % IV SOLN
2400.0000 mg/m2 | INTRAVENOUS | Status: DC
  Administered 2023-01-06: 5000 mg via INTRAVENOUS
  Filled 2023-01-06: qty 100

## 2023-01-06 MED ORDER — FLUOROURACIL CHEMO INJECTION 2.5 GM/50ML
400.0000 mg/m2 | Freq: Once | INTRAVENOUS | Status: AC
  Administered 2023-01-06: 750 mg via INTRAVENOUS
  Filled 2023-01-06: qty 15

## 2023-01-06 NOTE — Progress Notes (Signed)
[facility] CONSULT NOTE  Patient Care Team: [name], MD as PCP - General (Family Medicine) [name], RN as Oncology Nurse Navigator   CANCER STAGING   Cancer Staging  Cancer of transverse colon [facility]) Staging form: Colon and Rectum, AJCC 8th Edition - Clinical: Stage IIIC (cT4b, cN1a, cM0) - Signed by [name], MD on 07/12/2022 Stage prefix: Initial diagnosis Total positive nodes: 1 Total nodes examined: 24   ASSESSMENT & PLAN:  [name] 72 y.o. male with pmh of colon cancer s/p left sided colostomy in 2011, chemotherapy, neuropathy follows with Medical Oncology for Stage IIIC colon cancer.   #Transverse colon adenocarcinoma, Stage IIIC ([id number]), MMR proficient - presented with bowel obstruction. S/p resection and reanastomosis with Dr. [name] on 12/13. CT imaging negative for metastasis.  -Oxaliplatin held with cycle 4 due to worsening cold induced neuropathy.  Neuropathy has improved -> resumed oxaliplatin 50 mg/m with cycle 6.  Oxaliplatin permanently discontinued with cycle 8.    -CT chest abdomen pelvis (11/12/22) NED.  Plan to repeat CT imaging in 6 months.  Repeat colonoscopy due in December 2024.  -Patient here for cycle 11 of 5-FU/leucovorin.  Labs reviewed and acceptable for treatment.  Will proceed with cycle 11 today.  Day 3 pump disconnect.  He feels better with every 3-week regimen.  Will do the same with his cycle 12 which will be his last cycle.   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  Oxaliplatin discontinued -Stable since discontinuation of oxaliplatin.  # Insomnia -Taking Ambien 5 mg as needed.  Helping better than melatonin.  # Arthritis -Patient has multiple arthritic joints including shoulders or hips.  Worsened with chemotherapy -Norco 5-325 milligram every 6 as needed  # Iron deficiency anemia - s/p IV Venofer x 2 doses.  Continue with iron  pills.  #History of colon cancer s/p left sided colostomy, chemo in 2011 -Surgery and had adjuvant chemotherapy.  No records are available.  # Access -port placed on 08/08/2022 by Dr. [name]  No orders of the defined types were placed in this encounter.  RTC in 3 weeks for MD visit, labs, cycle twelve 5-FU/leucovorin  The total time spent in the appointment was 30 minutes encounter with patients including review of chart and various tests results, discussions about plan of care and coordination of care plan   All questions were answered. The patient knows to call the clinic with any problems, questions or concerns. No barriers to learning was detected.  [name], MD 6/17/202410:26 AM   HISTORY OF PRESENTING ILLNESS:  [name] 72 y.o. male with pmh of colon cancer s/p left sided colostomy in 2011, chemotherapy, neuropathy follows with Medical Oncology for Stage IIIC colon cancer.   Interval history Patient in today prior to cycle 11 of 5-FU/leucovorin.  We deferred cycle 11 last week.  The extra week allowed him to recover better in terms of energy level.  He is also eating drinking well.  Neuropathy is stable.  Sleeping better with Ambien but not 100%.  I have reviewed his chart and materials related to his cancer extensively and collaborated history with the patient. Summary of oncologic history is as follows: Oncology History Overview Note  Patient has a history of colon cancer in 2011 treated with Dr. [name] with radiation followed by surgery s/p left sided colostomy and adjuvant chemo likely FOLFOX. Records unavailable.    Cancer of transverse colon (HCC)  06/29/2022 Initial Diagnosis  Presented to ED for abdominal pain, nausea and vomiting.    Imaging   CT abdo pelvis IMPRESSION: 1. Patient is status post abdominoperineal resection with left lower quadrant ostomy. The ascending and transverse colon are moderately distended and there is abrupt caliber change at  the distal transverse colon/splenic flexure where there is irregular masslike thickening. Findings are suspicious for colonic obstruction due to a mass at this location. There is no dilated small bowel. 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 2. Slightly thick-walled presacral fluid collection with peripheral calcification, may reflect chronic postoperative collection. 3. Gallstones. 4. Aortic atherosclerosis.  CT chest IMPRESSION: 1. No acute intrathoracic process. No evidence of intrathoracic metastases.    Procedure   Colonoscopy on 06/29/22 by Dr. [name] A fungating partially obstructing mass in mid transverse colon.   07/03/2022 Pathology Results   DIAGNOSIS: A. 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 - SEE CANCER SUMMARY BELOW. - TATTOO INK. - SEPARATE 4.5 CM LENGTH OF SMALL INTESTINE.  CANCER CASE SUMMARY: COLON AND RECTUM Standard(s): AJCC-UICC 8  SPECIMEN Procedure: Transverse colectomy  TUMOR Tumor Site: Transverse colon Histologic Type: Adenocarcinoma Histologic Grade: G2, moderately differentiated Tumor Size: Greatest dimension: 6.1 cm Tumor Extent: Directly invades or adheres to adjacent structures (small intestine) Macroscopic Tumor Perforation: Not identified Lymphatic and/or Vascular Invasion: Large vessel (venous), extramural Perineural Invasion: Present Tumor Budding Score: High (10 or more) Treatment Effect: No known presurgical therapy  MARGINS Margin Status for Invasive Carcinoma: All margins negative for invasive carcinoma      Margins examined: Proximal, distal, and mesenteric Margin Status for Non-Invasive Tumor: All margins negative for high-grade dysplasia/intramucosal carcinoma and low-grade dysplasia  REGIONAL LYMPH NODES Regional Lymph Nodes: Regional lymph nodes present      Tumor present in  regional lymph node(s)           Number of Lymph Nodes with Tumor: 1      Number of Lymph Nodes Examined: 24  Tumor Deposits: Not identified  DISTANT METASTASES Distant Site(s) Involved, if applicable: Not applicable  PATHOLOGIC STAGE CLASSIFICATION (pTNM, AJCC 8th Edition): Modified Classification: Not applicable pT4b      T suffix: Not applicable pN1a pM not applicable - pM cannot be determined from the submitted specimen(s)    07/03/2022 Procedure   Exploratory laparotomy, en bloc colon resection, reanastomosis, primary repair of parastomal hernia by Dr. [name]     07/12/2022 Cancer Staging   Staging form: Colon and Rectum, AJCC 8th Edition - Clinical: Stage IIIC (cT4b, cN1a, cM0) - Signed by [name], MD on 07/12/2022 Stage prefix: Initial diagnosis Total positive nodes: 1 Total nodes examined: 24   08/12/2022 -  Chemotherapy   Patient is on Treatment Plan : COLORECTAL FOLFOX q14d x 6 months       MEDICAL HISTORY:  Past Medical History:  Diagnosis Date   Colon cancer (HCC)    remission 2011   Heart murmur    as a child from rheumatic fever   Malignant neoplasm of transverse colon (HCC) 2024   Microcytic anemia    Neuropathy    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HCC)    Protein-calorie malnutrition, severe (HCC)     SURGICAL HISTORY: Past Surgical History:  Procedure Laterality Date   APPENDECTOMY     COLECTOMY WITH COLOSTOMY CREATION/HARTMANN PROCEDURE N/A 07/03/2022   Procedure: COLECTOMY WITH COLOSTOMY CREATION/HARTMANN PROCEDURE;  Surgeon: Sung Amabile, DO;  Location: [facility] ORS;  Service:  General;  Laterality: N/A;   COLONOSCOPY N/A 06/29/2022   Procedure: COLONOSCOPY;  Surgeon: Sung Amabile, DO;  Location: [facility] ENDOSCOPY;  Service: General;  Laterality: N/A;   PORTACATH PLACEMENT Right 08/08/2022   Procedure: INSERTION PORT-A-CATH;  Surgeon: Sung Amabile, DO;  Location: [facility] ORS;  Service: General;  Laterality: Right;    SOCIAL HISTORY: Social History    Socioeconomic History   Marital status: Single    Spouse name: Not on file   Number of children: Not on file   Years of education: Not on file   Highest education level: Not on file  Occupational History   Not on file  Tobacco Use   Smoking status: Never   Smokeless tobacco: Never  Vaping Use   Vaping Use: Never used  Substance and Sexual Activity   Alcohol use: Not Currently   Drug use: Never   Sexual activity: Not on file  Other Topics Concern   Not on file  Social History Narrative   Not on file   Social Determinants of Health   Financial Resource Strain: Not on file  Food Insecurity: Unknown (06/29/2022)   Hunger Vital Sign    Worried About Running Out of Food in the Last Year: Never true    Ran Out of Food in the Last Year: Not on file  Transportation Needs: No Transportation Needs (06/29/2022)   PRAPARE - Administrator, Civil Service (Medical): No    Lack of Transportation (Non-Medical): No  Physical Activity: Not on file  Stress: Not on file  Social Connections: Not on file  Intimate Partner Violence: Not At Risk (06/29/2022)   Humiliation, Afraid, Rape, and Kick questionnaire    Fear of Current or Ex-Partner: No    Emotionally Abused: No    Physically Abused: No    Sexually Abused: No    FAMILY HISTORY: History reviewed. No pertinent family history.  ALLERGIES:  has No Known Allergies.  MEDICATIONS:  Current Outpatient Medications  Medication Sig Dispense Refill   dexamethasone (DECADRON) 4 MG tablet Take 1 tablet (4 mg total) by mouth daily. Start the day after chemotherapy for 2 days. Take with food. 30 tablet 1   Docusate Sodium (DSS) 100 MG CAPS Take 1 capsule by mouth 2 (two) times daily. As needed for mild constipation.     Ferrous Sulfate (IRON PO) Take 1 tablet by mouth 3 (three) times a week.     gabapentin (NEURONTIN) 100 MG capsule Take 2 capsules (200 mg total) by mouth 3 (three) times daily. 180 capsule 0    HYDROcodone-acetaminophen (NORCO) 5-325 MG tablet Take 1 tablet by mouth every 6 (six) hours as needed for up to 30 doses for moderate pain. 30 tablet 0   ibuprofen (ADVIL) 800 MG tablet 800 mg.     lidocaine-prilocaine (EMLA) cream Apply to affected area once 30 g 3   Multiple Vitamin (MULTIVITAMIN) tablet Take 1 tablet by mouth daily.     ondansetron (ZOFRAN) 4 MG tablet Take 4 mg by mouth every 8 (eight) hours as needed for nausea or vomiting.     ondansetron (ZOFRAN) 8 MG tablet Take 1 tablet (8 mg total) by mouth every 8 (eight) hours as needed for nausea or vomiting. Start on the third day after chemotherapy. 30 tablet 1   prochlorperazine (COMPAZINE) 10 MG tablet Take 1 tablet (10 mg total) by mouth every 6 (six) hours as needed for nausea or vomiting. 30 tablet 1   zolpidem (AMBIEN) 5 MG tablet  Take 1 tablet (5 mg total) by mouth at bedtime as needed for sleep. 30 tablet 0   potassium chloride SA (KLOR-CON M) 20 MEQ tablet Take 1 tablet (20 mEq total) by mouth daily for 3 days. (Patient not taking: Reported on 12/02/2022) 3 tablet 0   No current facility-administered medications for this visit.   Facility-Administered Medications Ordered in Other Visits  Medication Dose Route Frequency Provider Last Rate Last Admin   0.9 %  sodium chloride infusion   Intravenous PRN [name], MD 20 mL/hr at 01/06/23 1014 New Bag at 01/06/23 1014   dexamethasone (DECADRON) 10 mg in sodium chloride 0.9 % 50 mL IVPB  10 mg Intravenous Once [name], MD 204 mL/hr at 01/06/23 1016 10 mg at 01/06/23 1016   fluorouracil (ADRUCIL) 5,000 mg in sodium chloride 0.9 % 150 mL chemo infusion  2,400 mg/m2 (Treatment Plan Recorded) Intravenous 1 day or 1 dose [name], MD       fluorouracil (ADRUCIL) chemo injection 750 mg  400 mg/m2 (Treatment Plan Recorded) Intravenous Once [name], MD       heparin lock flush 100 unit/mL  500 Units Intravenous Once [name], MD       leucovorin 752 mg  in sodium chloride 0.9 % 250 mL infusion  400 mg/m2 (Treatment Plan Recorded) Intravenous Once [name], MD        REVIEW OF SYSTEMS:   Pertinent information mentioned in HPI All other systems were reviewed with the patient and are negative.  PHYSICAL EXAMINATION: ECOG PERFORMANCE STATUS: 1 - Symptomatic but completely ambulatory  Vitals:   01/06/23 0950  BP: (!) 142/90  Pulse: 76  Temp: 98.5 F (36.9 C)  SpO2: 98%    Filed Weights   01/06/23 0950  Weight: 179 lb 6.4 oz (81.4 kg)     GENERAL:alert, no distress and comfortable SKIN: skin color, texture, turgor are normal, no rashes or significant lesions EYES: normal, conjunctiva are pink and non-injected, sclera clear OROPHARYNX:no exudate, no erythema and lips, buccal mucosa, and tongue normal  NECK: supple, thyroid normal size, non-tender, without nodularity LYMPH:  no palpable lymphadenopathy in the cervical, axillary or inguinal LUNGS: clear to auscultation and percussion with normal breathing effort HEART: regular rate & rhythm and no murmurs and no lower extremity edema ABDOMEN:abdomen soft, non-tender and normal bowel sounds Musculoskeletal:no cyanosis of digits and no clubbing  PSYCH: alert & oriented x 3 with fluent speech NEURO: no focal motor/sensory deficits  LABORATORY DATA:  I have reviewed the data as listed Lab Results  Component Value Date   WBC 5.5 01/06/2023   HGB 11.4 (L) 01/06/2023   HCT 33.8 (L) 01/06/2023   MCV 102.1 (H) 01/06/2023   PLT 193 01/06/2023   Recent Labs    12/17/22 1256 12/30/22 0901 01/06/23 0930  NA 135 139 137  K 4.0 3.9 3.6  CL 104 109 107  CO2 25 23 22   GLUCOSE 87 106* 124*  BUN 15 14 13   CREATININE 0.98 1.00 0.92  CALCIUM 8.6* 8.7* 8.8*  GFRNONAA >60 >60 >60  PROT 6.6 6.8 6.3*  ALBUMIN 3.8 3.7 3.6  AST 27 22 23   ALT 21 15 15   ALKPHOS 74 80 99  BILITOT 0.8 0.7 0.6    RADIOGRAPHIC STUDIES: I have personally reviewed the radiological images as listed  and agreed with the findings in the report. No results found.

## 2023-01-06 NOTE — Progress Notes (Signed)
Patient says that he is feeling a lot better than he did at his last appointment.

## 2023-01-07 ENCOUNTER — Other Ambulatory Visit: Payer: Self-pay | Admitting: Internal Medicine

## 2023-01-07 ENCOUNTER — Encounter: Payer: Self-pay | Admitting: Internal Medicine

## 2023-01-07 ENCOUNTER — Other Ambulatory Visit: Payer: Self-pay

## 2023-01-07 NOTE — Telephone Encounter (Signed)
NA

## 2023-01-08 ENCOUNTER — Encounter: Payer: Self-pay | Admitting: Internal Medicine

## 2023-01-08 ENCOUNTER — Inpatient Hospital Stay: Payer: PPO

## 2023-01-08 DIAGNOSIS — Z5111 Encounter for antineoplastic chemotherapy: Secondary | ICD-10-CM | POA: Diagnosis not present

## 2023-01-08 DIAGNOSIS — C184 Malignant neoplasm of transverse colon: Secondary | ICD-10-CM

## 2023-01-08 MED ORDER — ZOLPIDEM TARTRATE 5 MG PO TABS: 5.0000 mg | ORAL_TABLET | Freq: Every evening | ORAL | 0 refills | Status: DC | PRN

## 2023-01-08 MED ORDER — HEPARIN SOD (PORK) LOCK FLUSH 100 UNIT/ML IV SOLN
500.0000 [IU] | Freq: Once | INTRAVENOUS | Status: AC | PRN
  Administered 2023-01-08: 500 [IU]
  Filled 2023-01-08: qty 5

## 2023-01-11 DIAGNOSIS — C189 Malignant neoplasm of colon, unspecified: Secondary | ICD-10-CM | POA: Diagnosis not present

## 2023-01-13 ENCOUNTER — Other Ambulatory Visit: Payer: PPO

## 2023-01-13 ENCOUNTER — Ambulatory Visit: Payer: PPO | Admitting: Internal Medicine

## 2023-01-13 ENCOUNTER — Ambulatory Visit: Payer: PPO

## 2023-01-14 ENCOUNTER — Other Ambulatory Visit: Payer: Self-pay | Admitting: Internal Medicine

## 2023-01-14 MED ORDER — HYDROCODONE-ACETAMINOPHEN 5-325 MG PO TABS: 1.0000 | ORAL_TABLET | Freq: Four times a day (QID) | ORAL | 0 refills | Status: DC | PRN

## 2023-01-15 ENCOUNTER — Other Ambulatory Visit: Payer: Self-pay | Admitting: Internal Medicine

## 2023-01-20 ENCOUNTER — Encounter: Payer: Self-pay | Admitting: Internal Medicine

## 2023-01-20 ENCOUNTER — Other Ambulatory Visit: Payer: Self-pay | Admitting: Internal Medicine

## 2023-01-20 ENCOUNTER — Ambulatory Visit: Payer: PPO

## 2023-01-20 ENCOUNTER — Other Ambulatory Visit: Payer: PPO

## 2023-01-20 ENCOUNTER — Ambulatory Visit: Payer: PPO | Admitting: Internal Medicine

## 2023-01-20 MED ORDER — GABAPENTIN 100 MG PO CAPS: 200.0000 mg | ORAL_CAPSULE | Freq: Three times a day (TID) | ORAL | 0 refills | Status: DC

## 2023-01-24 MED FILL — Dexamethasone Sodium Phosphate Inj 100 MG/10ML: INTRAMUSCULAR | Qty: 1 | Status: AC

## 2023-01-27 ENCOUNTER — Inpatient Hospital Stay: Payer: PPO | Attending: Internal Medicine

## 2023-01-27 ENCOUNTER — Inpatient Hospital Stay: Payer: PPO

## 2023-01-27 ENCOUNTER — Inpatient Hospital Stay (HOSPITAL_BASED_OUTPATIENT_CLINIC_OR_DEPARTMENT_OTHER): Payer: PPO | Admitting: Internal Medicine

## 2023-01-27 VITALS — BP 162/93 | HR 71 | Wt 177.4 lb

## 2023-01-27 DIAGNOSIS — Z79899 Other long term (current) drug therapy: Secondary | ICD-10-CM | POA: Insufficient documentation

## 2023-01-27 DIAGNOSIS — Z5111 Encounter for antineoplastic chemotherapy: Secondary | ICD-10-CM | POA: Diagnosis not present

## 2023-01-27 DIAGNOSIS — R5383 Other fatigue: Secondary | ICD-10-CM | POA: Diagnosis not present

## 2023-01-27 DIAGNOSIS — T451X5A Adverse effect of antineoplastic and immunosuppressive drugs, initial encounter: Secondary | ICD-10-CM | POA: Diagnosis not present

## 2023-01-27 DIAGNOSIS — D509 Iron deficiency anemia, unspecified: Secondary | ICD-10-CM

## 2023-01-27 DIAGNOSIS — G620 Drug-induced polyneuropathy: Secondary | ICD-10-CM | POA: Diagnosis not present

## 2023-01-27 DIAGNOSIS — C184 Malignant neoplasm of transverse colon: Secondary | ICD-10-CM

## 2023-01-27 DIAGNOSIS — C189 Malignant neoplasm of colon, unspecified: Secondary | ICD-10-CM | POA: Diagnosis not present

## 2023-01-27 LAB — COMPREHENSIVE METABOLIC PANEL
ALT: 24 U/L (ref 0–44)
AST: 36 U/L (ref 15–41)
Albumin: 3.9 g/dL (ref 3.5–5.0)
Alkaline Phosphatase: 71 U/L (ref 38–126)
Anion gap: 6 (ref 5–15)
BUN: 15 mg/dL (ref 8–23)
CO2: 23 mmol/L (ref 22–32)
Calcium: 9.2 mg/dL (ref 8.9–10.3)
Chloride: 109 mmol/L (ref 98–111)
Creatinine, Ser: 0.78 mg/dL (ref 0.61–1.24)
GFR, Estimated: >60 mL/min (ref >60)
Glucose, Bld: 124 mg/dL — ABNORMAL HIGH (ref 70–99)
Potassium: 3.8 mmol/L (ref 3.5–5.1)
Sodium: 138 mmol/L (ref 135–145)
Total Bilirubin: 0.5 mg/dL (ref 0.3–1.2)
Total Protein: 6.9 g/dL (ref 6.5–8.1)

## 2023-01-27 LAB — CBC WITH DIFFERENTIAL/PLATELET
Abs Immature Granulocytes: 0.03 10*3/uL (ref 0.00–0.07)
Basophils Absolute: 0.1 10*3/uL (ref 0.0–0.1)
Basophils Relative: 2 %
Eosinophils Absolute: 0.5 10*3/uL (ref 0.0–0.5)
Eosinophils Relative: 8 %
HCT: 36.9 % — ABNORMAL LOW (ref 39.0–52.0)
Hemoglobin: 12.8 g/dL — ABNORMAL LOW (ref 13.0–17.0)
Immature Granulocytes: 1 %
Lymphocytes Relative: 41 %
Lymphs Abs: 2.5 10*3/uL (ref 0.7–4.0)
MCH: 35.3 pg — ABNORMAL HIGH (ref 26.0–34.0)
MCHC: 34.7 g/dL (ref 30.0–36.0)
MCV: 101.7 fL — ABNORMAL HIGH (ref 80.0–100.0)
Monocytes Absolute: 0.8 10*3/uL (ref 0.1–1.0)
Monocytes Relative: 12 %
Neutro Abs: 2.2 10*3/uL (ref 1.7–7.7)
Neutrophils Relative %: 36 %
Platelets: 195 10*3/uL (ref 150–400)
RBC: 3.63 MIL/uL — ABNORMAL LOW (ref 4.22–5.81)
RDW: 13.2 % (ref 11.5–15.5)
WBC: 6.2 10*3/uL (ref 4.0–10.5)
nRBC: 0 % (ref 0.0–0.2)

## 2023-01-27 MED ORDER — FLUOROURACIL CHEMO INJECTION 2.5 GM/50ML
400.0000 mg/m2 | Freq: Once | INTRAVENOUS | Status: AC
  Administered 2023-01-27: 750 mg via INTRAVENOUS
  Filled 2023-01-27: qty 15

## 2023-01-27 MED ORDER — SODIUM CHLORIDE 0.9 % IV SOLN
10.0000 mg | Freq: Once | INTRAVENOUS | Status: AC
  Administered 2023-01-27: 10 mg via INTRAVENOUS
  Filled 2023-01-27: qty 10

## 2023-01-27 MED ORDER — SODIUM CHLORIDE 0.9 % IV SOLN
2400.0000 mg/m2 | INTRAVENOUS | Status: DC
  Administered 2023-01-27: 5000 mg via INTRAVENOUS
  Filled 2023-01-27: qty 100

## 2023-01-27 MED ORDER — SODIUM CHLORIDE 0.9 % IV SOLN
400.0000 mg/m2 | Freq: Once | INTRAVENOUS | Status: AC
  Administered 2023-01-27: 752 mg via INTRAVENOUS
  Filled 2023-01-27: qty 37.6

## 2023-01-27 MED ORDER — SODIUM CHLORIDE 0.9% FLUSH
10.0000 mL | Freq: Once | INTRAVENOUS | Status: AC
  Administered 2023-01-27: 10 mL via INTRAVENOUS
  Filled 2023-01-27: qty 10

## 2023-01-27 MED ORDER — SODIUM CHLORIDE 0.9 % IV SOLN
INTRAVENOUS | Status: DC | PRN
  Filled 2023-01-27: qty 250

## 2023-01-27 NOTE — Patient Instructions (Signed)
[facility]  Discharge Instructions: Thank you for choosing [facility] to provide your oncology and hematology care.  If you have a lab appointment with the Cancer Center, please go directly to the Cancer Center and check in at the registration area.  Wear comfortable clothing and clothing appropriate for easy access to any Portacath or PICC line.   We strive to give you quality time with your provider. You may need to reschedule your appointment if you arrive late (15 or more minutes).  Arriving late affects you and other patients whose appointments are after yours.  Also, if you miss three or more appointments without notifying the office, you may be dismissed from the clinic at the provider's discretion.      For prescription refill requests, have your pharmacy contact our office and allow 72 hours for refills to be completed.    Today you received the following chemotherapy and/or immunotherapy agents Leucovorin, 5-FU      To help prevent nausea and vomiting after your treatment, we encourage you to take your nausea medication as directed.  BELOW ARE SYMPTOMS THAT SHOULD BE REPORTED IMMEDIATELY: *FEVER GREATER THAN 100.4 F (38 C) OR HIGHER *CHILLS OR SWEATING *NAUSEA AND VOMITING THAT IS NOT CONTROLLED WITH YOUR NAUSEA MEDICATION *UNUSUAL SHORTNESS OF BREATH *UNUSUAL BRUISING OR BLEEDING *URINARY PROBLEMS (pain or burning when urinating, or frequent urination) *BOWEL PROBLEMS (unusual diarrhea, constipation, pain near the anus) TENDERNESS IN MOUTH AND THROAT WITH OR WITHOUT PRESENCE OF ULCERS (sore throat, sores in mouth, or a toothache) UNUSUAL RASH, SWELLING OR PAIN  UNUSUAL VAGINAL DISCHARGE OR ITCHING   Items with * indicate a potential emergency and should be followed up as soon as possible or go to the Emergency Department if any problems should occur.  Please show the CHEMOTHERAPY ALERT CARD or IMMUNOTHERAPY ALERT CARD at  check-in to the Emergency Department and triage nurse.  Should you have questions after your visit or need to cancel or reschedule your appointment, please contact [facility]  [phone] and follow the prompts.  Office hours are 8:00 a.m. to 4:30 p.m. Monday - Friday. Please note that voicemails left after 4:00 p.m. may not be returned until the following business day.  We are closed weekends and major holidays. You have access to a nurse at all times for urgent questions. Please call the main number to the clinic [phone] and follow the prompts.  For any non-urgent questions, you may also contact your provider using MyChart. We now offer e-Visits for anyone 18 and older to request care online for non-urgent symptoms. For details visit [email or web address].   Also download the MyChart app! Go to the app store, search "MyChart", open the app, select [facility], and log in with your MyChart username and password.    

## 2023-01-27 NOTE — Progress Notes (Signed)
[facility] CONSULT NOTE  Patient Care Team: [name], MD as PCP - General (Family Medicine) [name], RN as Oncology Nurse Navigator   CANCER STAGING   Cancer Staging  Cancer of transverse colon [facility]) Staging form: Colon and Rectum, AJCC 8th Edition - Clinical: Stage IIIC (cT4b, cN1a, cM0) - Signed by [name], MD on 07/12/2022 Stage prefix: Initial diagnosis Total positive nodes: 1 Total nodes examined: 24   ASSESSMENT & PLAN:  [name] 72 y.o. male with pmh of colon cancer s/p left sided colostomy in 2011, chemotherapy, neuropathy follows with Medical Oncology for Stage IIIC colon cancer.   #Transverse colon adenocarcinoma, Stage IIIC ([id number]), MMR proficient - presented with bowel obstruction. S/p resection and reanastomosis with Dr. [name] on 12/13. CT imaging negative for metastasis.  -Oxaliplatin held with cycle 4 due to worsening cold induced neuropathy.  Neuropathy has improved -> resumed oxaliplatin 50 mg/m with cycle 6.  Oxaliplatin permanently discontinued with cycle 8.    -CT chest abdomen pelvis (11/12/22) NED. Repeat colonoscopy due in December 2024.  -Labs reviewed and acceptable for treatment.  Will proceed with cycle 12 which is the last cycle of 5-FU and leucovorin.  Scheduled for CT chest abdomen pelvis end of October 2024.  Scheduled for port flushes every 8 weeks.  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 -Continue with gabapentin 200 mg 3 times daily  # Insomnia -Taking Ambien 5 mg as needed.  Helping better than melatonin.  # Arthritis -Patient has multiple arthritic joints including shoulders or hips.  Worsened with chemotherapy -Norco 5-325 milligram every 6 as needed  # Iron deficiency anemia - s/p IV Venofer x 2 doses.  Continue with iron pills.  Recheck iron levels next time  #History of colon cancer  s/p left sided colostomy, chemo in 2011 -Surgery and had adjuvant chemotherapy.  No records are available.  # Access -port placed on 08/08/2022 by Dr. [name]  Orders Placed This Encounter  Procedures   CT CHEST ABDOMEN PELVIS W CONTRAST    Standing Status:   Future    Standing Expiration Date:   01/27/2024    Order Specific Question:   If indicated for the ordered procedure, I authorize the administration of contrast media per Radiology protocol    Answer:   Yes    Order Specific Question:   Does the patient have a contrast media/X-ray dye allergy?    Answer:   No    Order Specific Question:   Preferred imaging location?    Answer:   [facility]    Order Specific Question:   If indicated for the ordered procedure, I authorize the administration of oral contrast media per Radiology protocol    Answer:   Yes   CBC with Differential/Platelet    Standing Status:   Future    Standing Expiration Date:   01/27/2024   Comprehensive metabolic panel    Standing Status:   Future    Standing Expiration Date:   01/27/2024   CEA    Standing Status:   Future    Standing Expiration Date:   01/27/2024   Iron and TIBC    Standing Status:   Future    Standing Expiration Date:   01/27/2024   Ferritin    Standing Status:   Future    Standing Expiration Date:   01/27/2024   RTC in 4 months for MD visit, labs, CT prior  Scheduled for port flushes every 8 weeks.  The total time spent in the appointment was 30 minutes encounter with patients including review of chart and various tests results, discussions about plan of care and coordination of care plan   All questions were answered. The patient knows to call the clinic with any problems, questions or concerns. No barriers to learning was detected.  [name], MD 7/8/202410:30 AM   HISTORY OF PRESENTING ILLNESS:  [name] 72 y.o. male with pmh of colon cancer s/p left sided colostomy in 2011, chemotherapy, neuropathy follows with Medical  Oncology for Stage IIIC colon cancer.   Interval history Patient in today prior to cycle 12 of 5-FU/leucovorin. Patient is doing okay overall.  Reports fatigue.  Neuropathy is manageable with gabapentin. Mild nausea.  Appetite is okay.  Sleep is improved with Ambien but not 100%.   I have reviewed his chart and materials related to his cancer extensively and collaborated history with the patient. Summary of oncologic history is as follows: Oncology History Overview Note  Patient has a history of colon cancer in 2011 treated with Dr. [name] with radiation followed by surgery s/p left sided colostomy and adjuvant chemo likely FOLFOX. Records unavailable.    Cancer of transverse colon (HCC)  06/29/2022 Initial Diagnosis   Presented to ED for abdominal pain, nausea and vomiting.    Imaging   CT abdo pelvis IMPRESSION: 1. Patient is status post abdominoperineal resection with left lower quadrant ostomy. The ascending and transverse colon are moderately distended and there is abrupt caliber change at the distal transverse colon/splenic flexure where there is irregular masslike thickening. Findings are suspicious for colonic obstruction due to a mass at this location. There is no dilated small bowel. 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 2. Slightly thick-walled presacral fluid collection with peripheral calcification, may reflect chronic postoperative collection. 3. Gallstones. 4. Aortic atherosclerosis.  CT chest IMPRESSION: 1. No acute intrathoracic process. No evidence of intrathoracic metastases.    Procedure   Colonoscopy on 06/29/22 by Dr. [name] A fungating partially obstructing mass in mid transverse colon.   07/03/2022 Pathology Results   DIAGNOSIS: A. 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 - SEE CANCER SUMMARY BELOW. - TATTOO  INK. - SEPARATE 4.5 CM LENGTH OF SMALL INTESTINE.  CANCER CASE SUMMARY: COLON AND RECTUM Standard(s): AJCC-UICC 8  SPECIMEN Procedure: Transverse colectomy  TUMOR Tumor Site: Transverse colon Histologic Type: Adenocarcinoma Histologic Grade: G2, moderately differentiated Tumor Size: Greatest dimension: 6.1 cm Tumor Extent: Directly invades or adheres to adjacent structures (small intestine) Macroscopic Tumor Perforation: Not identified Lymphatic and/or Vascular Invasion: Large vessel (venous), extramural Perineural Invasion: Present Tumor Budding Score: High (10 or more) Treatment Effect: No known presurgical therapy  MARGINS Margin Status for Invasive Carcinoma: All margins negative for invasive carcinoma      Margins examined: Proximal, distal, and mesenteric Margin Status for Non-Invasive Tumor: All margins negative for high-grade dysplasia/intramucosal carcinoma and low-grade dysplasia  REGIONAL LYMPH NODES Regional Lymph Nodes: Regional lymph nodes present      Tumor present in regional lymph node(s)           Number of Lymph Nodes with Tumor: 1      Number of Lymph Nodes Examined: 24  Tumor Deposits: Not identified  DISTANT METASTASES Distant Site(s) Involved, if applicable: Not applicable  PATHOLOGIC STAGE CLASSIFICATION (pTNM, AJCC 8th Edition): Modified Classification: Not applicable pT4b  T suffix: Not applicable pN1a pM not applicable - pM cannot be determined from the submitted specimen(s)    07/03/2022 Procedure   Exploratory laparotomy, en bloc colon resection, reanastomosis, primary repair of parastomal hernia by Dr. [name]     07/12/2022 Cancer Staging   Staging form: Colon and Rectum, AJCC 8th Edition - Clinical: Stage IIIC (cT4b, cN1a, cM0) - Signed by [name], MD on 07/12/2022 Stage prefix: Initial diagnosis Total positive nodes: 1 Total nodes examined: 24   08/12/2022 -  Chemotherapy   Patient is on Treatment Plan : COLORECTAL  FOLFOX q14d x 6 months       MEDICAL HISTORY:  Past Medical History:  Diagnosis Date   Colon cancer (HCC)    remission 2011   Heart murmur    as a child from rheumatic fever   Malignant neoplasm of transverse colon (HCC) 2024   Microcytic anemia    Neuropathy    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HCC)    Protein-calorie malnutrition, severe (HCC)     SURGICAL HISTORY: Past Surgical History:  Procedure Laterality Date   APPENDECTOMY     COLECTOMY WITH COLOSTOMY CREATION/HARTMANN PROCEDURE N/A 07/03/2022   Procedure: COLECTOMY WITH COLOSTOMY CREATION/HARTMANN PROCEDURE;  Surgeon: Sung Amabile, DO;  Location: [facility] ORS;  Service: General;  Laterality: N/A;   COLONOSCOPY N/A 06/29/2022   Procedure: COLONOSCOPY;  Surgeon: Sung Amabile, DO;  Location: [facility] ENDOSCOPY;  Service: General;  Laterality: N/A;   PORTACATH PLACEMENT Right 08/08/2022   Procedure: INSERTION PORT-A-CATH;  Surgeon: Sung Amabile, DO;  Location: [facility] ORS;  Service: General;  Laterality: Right;    SOCIAL HISTORY: Social History   Socioeconomic History   Marital status: Single    Spouse name: Not on file   Number of children: Not on file   Years of education: Not on file   Highest education level: Not on file  Occupational History   Not on file  Tobacco Use   Smoking status: Never   Smokeless tobacco: Never  Vaping Use   Vaping Use: Never used  Substance and Sexual Activity   Alcohol use: Not Currently   Drug use: Never   Sexual activity: Not on file  Other Topics Concern   Not on file  Social History Narrative   Not on file   Social Determinants of Health   Financial Resource Strain: Not on file  Food Insecurity: Unknown (06/29/2022)   Hunger Vital Sign    Worried About Running Out of Food in the Last Year: Never true    Ran Out of Food in the Last Year: Not on file  Transportation Needs: No Transportation Needs (06/29/2022)   PRAPARE - Administrator, Civil Service (Medical): No    Lack of  Transportation (Non-Medical): No  Physical Activity: Not on file  Stress: Not on file  Social Connections: Not on file  Intimate Partner Violence: Not At Risk (06/29/2022)   Humiliation, Afraid, Rape, and Kick questionnaire    Fear of Current or Ex-Partner: No    Emotionally Abused: No    Physically Abused: No    Sexually Abused: No    FAMILY HISTORY: No family history on file.  ALLERGIES:  has No Known Allergies.  MEDICATIONS:  Current Outpatient Medications  Medication Sig Dispense Refill   dexamethasone (DECADRON) 4 MG tablet Take 1 tablet (4 mg total) by mouth daily. Start the day after chemotherapy for 2 days. Take with food. 30 tablet 1   Docusate Sodium (DSS) 100 MG CAPS  Take 1 capsule by mouth 2 (two) times daily. As needed for mild constipation.     Ferrous Sulfate (IRON PO) Take 1 tablet by mouth 3 (three) times a week.     gabapentin (NEURONTIN) 100 MG capsule Take 2 capsules (200 mg total) by mouth 3 (three) times daily. 180 capsule 0   HYDROcodone-acetaminophen (NORCO) 5-325 MG tablet Take 1 tablet by mouth every 6 (six) hours as needed for up to 30 doses for moderate pain. 30 tablet 0   ibuprofen (ADVIL) 800 MG tablet 800 mg.     lidocaine-prilocaine (EMLA) cream Apply to affected area once 30 g 3   Multiple Vitamin (MULTIVITAMIN) tablet Take 1 tablet by mouth daily.     ondansetron (ZOFRAN) 4 MG tablet Take 4 mg by mouth every 8 (eight) hours as needed for nausea or vomiting.     ondansetron (ZOFRAN) 8 MG tablet Take 1 tablet (8 mg total) by mouth every 8 (eight) hours as needed for nausea or vomiting. Start on the third day after chemotherapy. 30 tablet 1   prochlorperazine (COMPAZINE) 10 MG tablet Take 1 tablet (10 mg total) by mouth every 6 (six) hours as needed for nausea or vomiting. 30 tablet 1   zolpidem (AMBIEN) 5 MG tablet Take 1 tablet (5 mg total) by mouth at bedtime as needed for sleep. 30 tablet 0   potassium chloride SA (KLOR-CON M) 20 MEQ tablet Take 1  tablet (20 mEq total) by mouth daily for 3 days. (Patient not taking: Reported on 12/02/2022) 3 tablet 0   No current facility-administered medications for this visit.   Facility-Administered Medications Ordered in Other Visits  Medication Dose Route Frequency Provider Last Rate Last Admin   0.9 %  sodium chloride infusion   Intravenous PRN [name], MD 20 mL/hr at 01/27/23 0949 New Bag at 01/27/23 0949   fluorouracil (ADRUCIL) 5,000 mg in sodium chloride 0.9 % 150 mL chemo infusion  2,400 mg/m2 (Treatment Plan Recorded) Intravenous 1 day or 1 dose [name], MD       fluorouracil (ADRUCIL) chemo injection 750 mg  400 mg/m2 (Treatment Plan Recorded) Intravenous Once [name], MD       heparin lock flush 100 unit/mL  500 Units Intravenous Once [name], MD       leucovorin 752 mg in sodium chloride 0.9 % 250 mL infusion  400 mg/m2 (Treatment Plan Recorded) Intravenous Once [name], MD 575 mL/hr at 01/27/23 1023 752 mg at 01/27/23 1023    REVIEW OF SYSTEMS:   Pertinent information mentioned in HPI All other systems were reviewed with the patient and are negative.  PHYSICAL EXAMINATION: ECOG PERFORMANCE STATUS: 1 - Symptomatic but completely ambulatory  Vitals:   01/27/23 0907  BP: (!) 162/93  Pulse: 71  SpO2: 98%    Filed Weights   01/27/23 0907  Weight: 177 lb 6.4 oz (80.5 kg)     GENERAL:alert, no distress and comfortable SKIN: skin color, texture, turgor are normal, no rashes or significant lesions EYES: normal, conjunctiva are pink and non-injected, sclera clear OROPHARYNX:no exudate, no erythema and lips, buccal mucosa, and tongue normal  NECK: supple, thyroid normal size, non-tender, without nodularity LYMPH:  no palpable lymphadenopathy in the cervical, axillary or inguinal LUNGS: clear to auscultation and percussion with normal breathing effort HEART: regular rate & rhythm and no murmurs and no lower extremity edema ABDOMEN:abdomen  soft, non-tender and normal bowel sounds Musculoskeletal:no cyanosis of digits and no clubbing  PSYCH: alert &  oriented x 3 with fluent speech NEURO: no focal motor/sensory deficits  LABORATORY DATA:  I have reviewed the data as listed Lab Results  Component Value Date   WBC 6.2 01/27/2023   HGB 12.8 (L) 01/27/2023   HCT 36.9 (L) 01/27/2023   MCV 101.7 (H) 01/27/2023   PLT 195 01/27/2023   Recent Labs    12/30/22 0901 01/06/23 0930 01/27/23 0844  NA 139 137 138  K 3.9 3.6 3.8  CL 109 107 109  CO2 23 22 23   GLUCOSE 106* 124* 124*  BUN 14 13 15   CREATININE 1.00 0.92 0.78  CALCIUM 8.7* 8.8* 9.2  GFRNONAA >60 >60 >60  PROT 6.8 6.3* 6.9  ALBUMIN 3.7 3.6 3.9  AST 22 23 36  ALT 15 15 24   ALKPHOS 80 99 71  BILITOT 0.7 0.6 0.5    RADIOGRAPHIC STUDIES: I have personally reviewed the radiological images as listed and agreed with the findings in the report. No results found.

## 2023-01-28 ENCOUNTER — Other Ambulatory Visit: Payer: Self-pay

## 2023-01-29 ENCOUNTER — Other Ambulatory Visit: Payer: Self-pay | Admitting: Internal Medicine

## 2023-01-29 ENCOUNTER — Inpatient Hospital Stay: Payer: PPO

## 2023-01-29 VITALS — BP 163/86 | HR 60 | Resp 18

## 2023-01-29 DIAGNOSIS — Z5111 Encounter for antineoplastic chemotherapy: Secondary | ICD-10-CM | POA: Diagnosis not present

## 2023-01-29 DIAGNOSIS — C184 Malignant neoplasm of transverse colon: Secondary | ICD-10-CM

## 2023-01-29 MED ORDER — HEPARIN SOD (PORK) LOCK FLUSH 100 UNIT/ML IV SOLN
500.0000 [IU] | Freq: Once | INTRAVENOUS | Status: AC | PRN
  Administered 2023-01-29: 500 [IU]
  Filled 2023-01-29: qty 5

## 2023-01-29 MED ORDER — SODIUM CHLORIDE 0.9% FLUSH
10.0000 mL | INTRAVENOUS | Status: AC | PRN
  Administered 2023-01-29: 10 mL
  Filled 2023-01-29: qty 10

## 2023-01-30 ENCOUNTER — Encounter: Payer: Self-pay | Admitting: Internal Medicine

## 2023-01-30 MED ORDER — HYDROCODONE-ACETAMINOPHEN 5-325 MG PO TABS: 1.0000 | ORAL_TABLET | Freq: Four times a day (QID) | ORAL | 0 refills | Status: DC | PRN

## 2023-02-14 ENCOUNTER — Other Ambulatory Visit: Payer: Self-pay | Admitting: Internal Medicine

## 2023-02-14 MED ORDER — ZOLPIDEM TARTRATE 5 MG PO TABS: 5.0000 mg | ORAL_TABLET | Freq: Every evening | ORAL | 0 refills | Status: DC | PRN

## 2023-02-17 ENCOUNTER — Other Ambulatory Visit: Payer: Self-pay | Admitting: Internal Medicine

## 2023-02-17 MED ORDER — HYDROCODONE-ACETAMINOPHEN 5-325 MG PO TABS: 1.0000 | ORAL_TABLET | Freq: Four times a day (QID) | ORAL | 0 refills | Status: DC | PRN

## 2023-02-21 DIAGNOSIS — Z933 Colostomy status: Secondary | ICD-10-CM | POA: Diagnosis not present

## 2023-03-04 ENCOUNTER — Other Ambulatory Visit: Payer: Self-pay | Admitting: Internal Medicine

## 2023-03-04 MED ORDER — GABAPENTIN 100 MG PO CAPS: 200.0000 mg | ORAL_CAPSULE | Freq: Three times a day (TID) | ORAL | 0 refills | Status: DC

## 2023-03-07 DIAGNOSIS — M7541 Impingement syndrome of right shoulder: Secondary | ICD-10-CM | POA: Diagnosis not present

## 2023-03-20 ENCOUNTER — Other Ambulatory Visit: Payer: Self-pay | Admitting: Internal Medicine

## 2023-03-21 ENCOUNTER — Encounter: Payer: Self-pay | Admitting: Internal Medicine

## 2023-03-21 MED ORDER — HYDROCODONE-ACETAMINOPHEN 5-325 MG PO TABS: 1.0000 | ORAL_TABLET | Freq: Four times a day (QID) | ORAL | 0 refills | Status: DC | PRN

## 2023-03-21 MED ORDER — ZOLPIDEM TARTRATE 5 MG PO TABS: 5.0000 mg | ORAL_TABLET | Freq: Every evening | ORAL | 0 refills | Status: DC | PRN

## 2023-03-25 ENCOUNTER — Inpatient Hospital Stay: Payer: PPO | Attending: Internal Medicine

## 2023-03-25 DIAGNOSIS — Z95828 Presence of other vascular implants and grafts: Secondary | ICD-10-CM

## 2023-03-25 DIAGNOSIS — C184 Malignant neoplasm of transverse colon: Secondary | ICD-10-CM | POA: Insufficient documentation

## 2023-03-25 DIAGNOSIS — Z452 Encounter for adjustment and management of vascular access device: Secondary | ICD-10-CM | POA: Insufficient documentation

## 2023-03-25 MED ORDER — SODIUM CHLORIDE 0.9% FLUSH
10.0000 mL | Freq: Once | INTRAVENOUS | Status: AC
  Administered 2023-03-25: 10 mL via INTRAVENOUS
  Filled 2023-03-25: qty 10

## 2023-03-25 MED ORDER — HEPARIN SOD (PORK) LOCK FLUSH 100 UNIT/ML IV SOLN
500.0000 [IU] | Freq: Once | INTRAVENOUS | Status: AC
  Administered 2023-03-25: 500 [IU] via INTRAVENOUS
  Filled 2023-03-25: qty 5

## 2023-04-10 ENCOUNTER — Other Ambulatory Visit: Payer: Self-pay | Admitting: Internal Medicine

## 2023-04-13 ENCOUNTER — Encounter: Payer: Self-pay | Admitting: Internal Medicine

## 2023-04-13 MED ORDER — GABAPENTIN 100 MG PO CAPS: 200.0000 mg | ORAL_CAPSULE | Freq: Three times a day (TID) | ORAL | 0 refills | Status: DC

## 2023-04-14 ENCOUNTER — Other Ambulatory Visit: Payer: Self-pay | Admitting: *Deleted

## 2023-04-14 ENCOUNTER — Encounter: Payer: Self-pay | Admitting: Internal Medicine

## 2023-04-14 MED ORDER — ZOLPIDEM TARTRATE 5 MG PO TABS: 5.0000 mg | ORAL_TABLET | Freq: Every evening | ORAL | 0 refills | Status: DC | PRN

## 2023-04-15 ENCOUNTER — Encounter: Payer: Self-pay | Admitting: Internal Medicine

## 2023-04-15 MED ORDER — OXYCODONE HCL 5 MG PO TABS
5.0000 mg | ORAL_TABLET | Freq: Every day | ORAL | 0 refills | Status: DC | PRN
Start: 2023-04-15 — End: 2023-05-14

## 2023-04-29 ENCOUNTER — Other Ambulatory Visit: Payer: Self-pay

## 2023-05-14 ENCOUNTER — Ambulatory Visit
Admission: RE | Admit: 2023-05-14 | Discharge: 2023-05-14 | Disposition: A | Discharge status: Home / Self Care | Payer: PPO | Source: Ambulatory Visit | Attending: Internal Medicine | Admitting: Internal Medicine

## 2023-05-14 ENCOUNTER — Other Ambulatory Visit: Payer: Self-pay | Admitting: Internal Medicine

## 2023-05-14 DIAGNOSIS — I700 Atherosclerosis of aorta: Secondary | ICD-10-CM | POA: Diagnosis not present

## 2023-05-14 DIAGNOSIS — C184 Malignant neoplasm of transverse colon: Secondary | ICD-10-CM | POA: Diagnosis not present

## 2023-05-14 DIAGNOSIS — K8020 Calculus of gallbladder without cholecystitis without obstruction: Secondary | ICD-10-CM | POA: Diagnosis not present

## 2023-05-14 LAB — POCT I-STAT CREATININE: Creatinine, Ser: 1.1 mg/dL (ref 0.61–1.24)

## 2023-05-14 MED ORDER — OXYCODONE HCL 5 MG PO TABS: 5.0000 mg | ORAL_TABLET | Freq: Every day | ORAL | 0 refills | Status: AC | PRN

## 2023-05-14 MED ORDER — IOHEXOL 300 MG/ML  SOLN
100.0000 mL | Freq: Once | INTRAMUSCULAR | Status: AC | PRN
  Administered 2023-05-14: 100 mL via INTRAVENOUS

## 2023-05-16 DIAGNOSIS — M129 Arthropathy, unspecified: Secondary | ICD-10-CM | POA: Diagnosis not present

## 2023-05-16 DIAGNOSIS — Z0189 Encounter for other specified special examinations: Secondary | ICD-10-CM | POA: Diagnosis not present

## 2023-05-16 DIAGNOSIS — M19012 Primary osteoarthritis, left shoulder: Secondary | ICD-10-CM | POA: Diagnosis not present

## 2023-05-19 ENCOUNTER — Inpatient Hospital Stay: Payer: PPO

## 2023-05-26 ENCOUNTER — Inpatient Hospital Stay: Payer: PPO | Attending: Internal Medicine

## 2023-05-26 ENCOUNTER — Inpatient Hospital Stay (HOSPITAL_BASED_OUTPATIENT_CLINIC_OR_DEPARTMENT_OTHER): Payer: PPO | Admitting: Internal Medicine

## 2023-05-26 VITALS — BP 162/86 | HR 66 | Temp 97.7°F | Wt 185.0 lb

## 2023-05-26 DIAGNOSIS — M1990 Unspecified osteoarthritis, unspecified site: Secondary | ICD-10-CM | POA: Insufficient documentation

## 2023-05-26 DIAGNOSIS — C184 Malignant neoplasm of transverse colon: Secondary | ICD-10-CM | POA: Diagnosis not present

## 2023-05-26 DIAGNOSIS — R9389 Abnormal findings on diagnostic imaging of other specified body structures: Secondary | ICD-10-CM | POA: Diagnosis not present

## 2023-05-26 DIAGNOSIS — Z85038 Personal history of other malignant neoplasm of large intestine: Secondary | ICD-10-CM | POA: Diagnosis not present

## 2023-05-26 DIAGNOSIS — D509 Iron deficiency anemia, unspecified: Secondary | ICD-10-CM | POA: Diagnosis not present

## 2023-05-26 DIAGNOSIS — Z79899 Other long term (current) drug therapy: Secondary | ICD-10-CM | POA: Diagnosis not present

## 2023-05-26 DIAGNOSIS — T451X5A Adverse effect of antineoplastic and immunosuppressive drugs, initial encounter: Secondary | ICD-10-CM | POA: Insufficient documentation

## 2023-05-26 DIAGNOSIS — G620 Drug-induced polyneuropathy: Secondary | ICD-10-CM | POA: Diagnosis not present

## 2023-05-26 DIAGNOSIS — Z9049 Acquired absence of other specified parts of digestive tract: Secondary | ICD-10-CM | POA: Diagnosis not present

## 2023-05-26 DIAGNOSIS — Z923 Personal history of irradiation: Secondary | ICD-10-CM | POA: Diagnosis not present

## 2023-05-26 DIAGNOSIS — I288 Other diseases of pulmonary vessels: Secondary | ICD-10-CM | POA: Insufficient documentation

## 2023-05-26 DIAGNOSIS — Z862 Personal history of diseases of the blood and blood-forming organs and certain disorders involving the immune mechanism: Secondary | ICD-10-CM | POA: Insufficient documentation

## 2023-05-26 DIAGNOSIS — Z95828 Presence of other vascular implants and grafts: Secondary | ICD-10-CM

## 2023-05-26 LAB — COMPREHENSIVE METABOLIC PANEL
ALT: 18 U/L (ref 0–44)
AST: 20 U/L (ref 15–41)
Albumin: 4 g/dL (ref 3.5–5.0)
Alkaline Phosphatase: 109 U/L (ref 38–126)
Anion gap: 6 (ref 5–15)
BUN: 18 mg/dL (ref 8–23)
CO2: 23 mmol/L (ref 22–32)
Calcium: 8.9 mg/dL (ref 8.9–10.3)
Chloride: 107 mmol/L (ref 98–111)
Creatinine, Ser: 1.04 mg/dL (ref 0.61–1.24)
GFR, Estimated: >60 mL/min (ref >60)
Glucose, Bld: 109 mg/dL — ABNORMAL HIGH (ref 70–99)
Potassium: 4 mmol/L (ref 3.5–5.1)
Sodium: 136 mmol/L (ref 135–145)
Total Bilirubin: 0.7 mg/dL (ref <1.2)
Total Protein: 6.8 g/dL (ref 6.5–8.1)

## 2023-05-26 LAB — CBC WITH DIFFERENTIAL/PLATELET
Abs Immature Granulocytes: 0.02 10*3/uL (ref 0.00–0.07)
Basophils Absolute: 0.1 10*3/uL (ref 0.0–0.1)
Basophils Relative: 1 %
Eosinophils Absolute: 0.3 10*3/uL (ref 0.0–0.5)
Eosinophils Relative: 4 %
HCT: 40.4 % (ref 39.0–52.0)
Hemoglobin: 13.9 g/dL (ref 13.0–17.0)
Immature Granulocytes: 0 %
Lymphocytes Relative: 34 %
Lymphs Abs: 2.6 10*3/uL (ref 0.7–4.0)
MCH: 32.3 pg (ref 26.0–34.0)
MCHC: 34.4 g/dL (ref 30.0–36.0)
MCV: 94 fL (ref 80.0–100.0)
Monocytes Absolute: 0.7 10*3/uL (ref 0.1–1.0)
Monocytes Relative: 9 %
Neutro Abs: 4 10*3/uL (ref 1.7–7.7)
Neutrophils Relative %: 52 %
Platelets: 176 10*3/uL (ref 150–400)
RBC: 4.3 MIL/uL (ref 4.22–5.81)
RDW: 13.2 % (ref 11.5–15.5)
WBC: 7.8 10*3/uL (ref 4.0–10.5)
nRBC: 0 % (ref 0.0–0.2)

## 2023-05-26 LAB — IRON AND TIBC
Iron: 120 ug/dL (ref 45–182)
Saturation Ratios: 37 % (ref 17.9–39.5)
TIBC: 326 ug/dL (ref 250–450)
UIBC: 206 ug/dL

## 2023-05-26 LAB — FERRITIN: Ferritin: 55 ng/mL (ref 24–336)

## 2023-05-26 MED ORDER — HEPARIN SOD (PORK) LOCK FLUSH 100 UNIT/ML IV SOLN
500.0000 [IU] | Freq: Once | INTRAVENOUS | Status: AC
  Administered 2023-05-26: 500 [IU] via INTRAVENOUS
  Filled 2023-05-26: qty 5

## 2023-05-26 MED ORDER — SODIUM CHLORIDE 0.9% FLUSH
10.0000 mL | Freq: Once | INTRAVENOUS | Status: AC
  Administered 2023-05-26: 10 mL via INTRAVENOUS
  Filled 2023-05-26: qty 10

## 2023-05-26 NOTE — Progress Notes (Addendum)
Cancer Center CONSULT NOTE  Patient Care Team: [name], MD as PCP - General (Family Medicine) [name], RN as Oncology Nurse Navigator   CANCER STAGING   Cancer Staging  Cancer of transverse colon [name]) Staging form: Colon and Rectum, AJCC 8th Edition - Clinical: Stage IIIC (cT4b, cN1a, cM0) - Signed by [name], MD on 07/12/2022 Stage prefix: Initial diagnosis Total positive nodes: 1 Total nodes examined: 24   ASSESSMENT & PLAN:  [name] 72 y.o. male with pmh of colon cancer s/p left sided colostomy in 2011, chemotherapy, neuropathy follows with Medical Oncology for Stage IIIC colon cancer.   #Transverse colon adenocarcinoma, Stage IIIC ([id number]), MMR proficient - presented with bowel obstruction. S/p resection and reanastomosis with Dr. [name] on 12/13.   -Oxaliplatin held with cycle 4 due to worsening cold induced neuropathy.  Neuropathy has improved -> resumed oxaliplatin 50 mg/m with cycle 6.  Oxaliplatin permanently discontinued with cycle 8.  Completed 12 cycles of 5-FU on 01/27/2023.  -CT chest abdomen pelvis from 05/14/2023-NED.  Showing dilated pulmonary trunk 4 cm and bronchiolitis changes in bilateral lung apices. -CBC and CMP unremarkable.  CEA level pending. -Due for surveillance colonoscopy in December 2024 which will be 1 year postsurgery.  Will make referral to Dr. [name] to schedule it.  Patient is interested in getting it done with him. -I will follow-up with him in 3 months with labs.  CT imaging due in 6 months (around end of April 2025).  # Dilated pulmonary trunk -Incidentally detected on CT imaging.  Patient is asymptomatic. -Referral to pulmonary to further assess.  I discussed with the patient this is outside of my scope.  # Chemotherapy induced neuropathy -Continue with gabapentin 300 mg twice daily.  Hard for him to remember 3 times daily dosing.  # Arthritis -Patient has multiple arthritic joints  including shoulders or hips.  -On oxycodone 5 mg once a day  # Iron deficiency anemia -Resolved.  Iron levels are normal.  #History of colon cancer s/p left sided colostomy, chemo in 2011 -Surgery and had adjuvant chemotherapy.  No records are available.  # Access -port placed on 08/08/2022 by Dr. [name].  Port maintenance with flushes every 2 months.  # Pt is scheduled for shoulder surgery with Dr. [name] at [name] on December 11.  From oncology standpoint, patient is cleared for the surgery.  Will fax my clinic note from today's encounter.    Orders Placed This Encounter  Procedures   CBC with Differential (Cancer Center Only)    Standing Status:   Future    Standing Expiration Date:   05/25/2024   CMP (Cancer Center only)    Standing Status:   Future    Standing Expiration Date:   05/25/2024   Iron and TIBC    Standing Status:   Future    Standing Expiration Date:   05/25/2024   CEA    Standing Status:   Future    Standing Expiration Date:   05/25/2024   Ferritin    Standing Status:   Future    Standing Expiration Date:   05/25/2024   Ambulatory referral to Pulmonology    Referral Priority:   Routine    Referral Type:   Consultation    Referral Reason:   Specialty Services Required    Requested Specialty:   Pulmonary Disease    Number of Visits Requested:   1   Ambulatory referral to Gastroenterology    Referral  Priority:   Routine    Referral Type:   Consultation    Referral Reason:   Specialty Services Required    Number of Visits Requested:   1   RTC in 3 months for MD visit, labs  The total time spent in the appointment was 30 minutes encounter with patients including review of chart and various tests results, discussions about plan of care and coordination of care plan   All questions were answered. The patient knows to call the clinic with any problems, questions or concerns. No barriers to learning was detected.  [name], MD 11/4/202412:27  PM   HISTORY OF PRESENTING ILLNESS:  [name] 72 y.o. male with pmh of colon cancer s/p left sided colostomy in 2011, chemotherapy, neuropathy follows with Medical Oncology for Stage IIIC colon cancer.   Interval history Patient is here today accompanied with wife for CT discussion, labs on surveillance for colon cancer Reports feeling remarkably better and back to his baseline since completion of chemotherapy.  Energy level is good.  Appetite is good.  Neuropathy is under control with gabapentin.  Denies any shortness of breath, chest pain, changes with bowel movement, black tarry stools or bleeding.  I have reviewed his chart and materials related to his cancer extensively and collaborated history with the patient. Summary of oncologic history is as follows: Oncology History Overview Note  Patient has a history of colon cancer in 2011 treated with Dr. [name] with radiation followed by surgery s/p left sided colostomy and adjuvant chemo likely FOLFOX. Records unavailable.    Cancer of transverse colon (HCC)  06/29/2022 Initial Diagnosis   Presented to ED for abdominal pain, nausea and vomiting.    Imaging   CT abdo pelvis IMPRESSION: 1. Patient is status post abdominoperineal resection with left lower quadrant ostomy. The ascending and transverse colon are moderately distended and there is abrupt caliber change at the distal transverse colon/splenic flexure where there is irregular masslike thickening. Findings are suspicious for colonic obstruction due to a mass at this location. There is no dilated small bowel. 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 2. Slightly thick-walled presacral fluid collection with peripheral calcification, may reflect chronic postoperative collection. 3. Gallstones. 4. Aortic atherosclerosis.  CT chest IMPRESSION: 1. No acute intrathoracic process. No evidence of  intrathoracic metastases.    Procedure   Colonoscopy on 06/29/22 by Dr. [name] A fungating partially obstructing mass in mid transverse colon.   07/03/2022 Pathology Results   DIAGNOSIS: A. 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 - SEE CANCER SUMMARY BELOW. - TATTOO INK. - SEPARATE 4.5 CM LENGTH OF SMALL INTESTINE.  CANCER CASE SUMMARY: COLON AND RECTUM Standard(s): AJCC-UICC 8  SPECIMEN Procedure: Transverse colectomy  TUMOR Tumor Site: Transverse colon Histologic Type: Adenocarcinoma Histologic Grade: G2, moderately differentiated Tumor Size: Greatest dimension: 6.1 cm Tumor Extent: Directly invades or adheres to adjacent structures (small intestine) Macroscopic Tumor Perforation: Not identified Lymphatic and/or Vascular Invasion: Large vessel (venous), extramural Perineural Invasion: Present Tumor Budding Score: High (10 or more) Treatment Effect: No known presurgical therapy  MARGINS Margin Status for Invasive Carcinoma: All margins negative for invasive carcinoma      Margins examined: Proximal, distal, and mesenteric Margin Status for Non-Invasive Tumor: All margins negative for high-grade dysplasia/intramucosal carcinoma and low-grade dysplasia  REGIONAL LYMPH NODES Regional Lymph Nodes: Regional lymph nodes present      Tumor present in regional lymph  node(s)           Number of Lymph Nodes with Tumor: 1      Number of Lymph Nodes Examined: 24  Tumor Deposits: Not identified  DISTANT METASTASES Distant Site(s) Involved, if applicable: Not applicable  PATHOLOGIC STAGE CLASSIFICATION (pTNM, AJCC 8th Edition): Modified Classification: Not applicable pT4b      T suffix: Not applicable pN1a pM not applicable - pM cannot be determined from the submitted specimen(s)    07/03/2022 Procedure   Exploratory laparotomy, en bloc colon resection, reanastomosis, primary repair of parastomal  hernia by Dr. [name]     07/12/2022 Cancer Staging   Staging form: Colon and Rectum, AJCC 8th Edition - Clinical: Stage IIIC (cT4b, cN1a, cM0) - Signed by [name], MD on 07/12/2022 Stage prefix: Initial diagnosis Total positive nodes: 1 Total nodes examined: 24   08/12/2022 -  Chemotherapy   Patient is on Treatment Plan : COLORECTAL FOLFOX q14d x 6 months       MEDICAL HISTORY:  Past Medical History:  Diagnosis Date   Colon cancer (HCC)    remission 2011   Heart murmur    as a child from rheumatic fever   Malignant neoplasm of transverse colon (HCC) 2024   Microcytic anemia    Neuropathy    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HCC)    Protein-calorie malnutrition, severe (HCC)     SURGICAL HISTORY: Past Surgical History:  Procedure Laterality Date   APPENDECTOMY     COLECTOMY WITH COLOSTOMY CREATION/HARTMANN PROCEDURE N/A 07/03/2022   Procedure: COLECTOMY WITH COLOSTOMY CREATION/HARTMANN PROCEDURE;  Surgeon: Sung Amabile, DO;  Location: [facility] ORS;  Service: General;  Laterality: N/A;   COLONOSCOPY N/A 06/29/2022   Procedure: COLONOSCOPY;  Surgeon: Sung Amabile, DO;  Location: [facility];  Service: General;  Laterality: N/A;   PORTACATH PLACEMENT Right 08/08/2022   Procedure: INSERTION PORT-A-CATH;  Surgeon: Sung Amabile, DO;  Location: [facility] ORS;  Service: General;  Laterality: Right;    SOCIAL HISTORY: Social History   Socioeconomic History   Marital status: Single    Spouse name: Not on file   Number of children: Not on file   Years of education: Not on file   Highest education level: Not on file  Occupational History   Not on file  Tobacco Use   Smoking status: Never   Smokeless tobacco: Never  Vaping Use   Vaping status: Never Used  Substance and Sexual Activity   Alcohol use: Not Currently   Drug use: Never   Sexual activity: Not on file  Other Topics Concern   Not on file  Social History Narrative   Not on file   Social Determinants of Health    Financial Resource Strain: Not on file  Food Insecurity: Unknown (06/29/2022)   Hunger Vital Sign    Worried About Running Out of Food in the Last Year: Never true    Ran Out of Food in the Last Year: Not on file  Transportation Needs: No Transportation Needs (06/29/2022)   PRAPARE - Administrator, Civil Service (Medical): No    Lack of Transportation (Non-Medical): No  Physical Activity: Not on file  Stress: Not on file  Social Connections: Not on file  Intimate Partner Violence: Not At Risk (06/29/2022)   Humiliation, Afraid, Rape, and Kick questionnaire    Fear of Current or Ex-Partner: No    Emotionally Abused: No    Physically Abused: No    Sexually Abused: No    FAMILY  HISTORY: No family history on file.  ALLERGIES:  has No Known Allergies.  MEDICATIONS:  Current Outpatient Medications  Medication Sig Dispense Refill   Docusate Sodium (DSS) 100 MG CAPS Take 1 capsule by mouth 2 (two) times daily. As needed for mild constipation.     Ferrous Sulfate (IRON PO) Take 1 tablet by mouth 3 (three) times a week.     gabapentin (NEURONTIN) 100 MG capsule Take 2 capsules (200 mg total) by mouth 3 (three) times daily. 180 capsule 0   ibuprofen (ADVIL) 800 MG tablet 800 mg.     lidocaine-prilocaine (EMLA) cream Apply to affected area once 30 g 3   Multiple Vitamin (MULTIVITAMIN) tablet Take 1 tablet by mouth daily.     ondansetron (ZOFRAN) 8 MG tablet Take 1 tablet (8 mg total) by mouth every 8 (eight) hours as needed for nausea or vomiting. Start on the third day after chemotherapy. 30 tablet 1   oxyCODONE (OXY IR/ROXICODONE) 5 MG immediate release tablet Take 1 tablet (5 mg total) by mouth daily as needed for severe pain (pain score 7-10). 30 tablet 0   prochlorperazine (COMPAZINE) 10 MG tablet Take 1 tablet (10 mg total) by mouth every 6 (six) hours as needed for nausea or vomiting. 30 tablet 1   dexamethasone (DECADRON) 4 MG tablet Take 1 tablet (4 mg total) by mouth  daily. Start the day after chemotherapy for 2 days. Take with food. (Patient not taking: Reported on 05/26/2023) 30 tablet 1   HYDROcodone-acetaminophen (NORCO) 5-325 MG tablet Take 1 tablet by mouth every 6 (six) hours as needed for up to 30 doses for moderate pain. (Patient not taking: Reported on 05/26/2023) 30 tablet 0   ondansetron (ZOFRAN) 4 MG tablet Take 4 mg by mouth every 8 (eight) hours as needed for nausea or vomiting. (Patient not taking: Reported on 05/26/2023)     potassium chloride SA (KLOR-CON M) 20 MEQ tablet Take 1 tablet (20 mEq total) by mouth daily for 3 days. (Patient not taking: Reported on 12/02/2022) 3 tablet 0   zolpidem (AMBIEN) 5 MG tablet Take 1 tablet (5 mg total) by mouth at bedtime as needed for sleep. (Patient not taking: Reported on 05/26/2023) 30 tablet 0   No current facility-administered medications for this visit.   Facility-Administered Medications Ordered in Other Visits  Medication Dose Route Frequency Provider Last Rate Last Admin   heparin lock flush 100 unit/mL  500 Units Intravenous Once [name], MD       sodium chloride flush (NS) 0.9 % injection 10 mL  10 mL Intracatheter PRN [name], MD   10 mL at 01/29/23 1321    REVIEW OF SYSTEMS:   Pertinent information mentioned in HPI All other systems were reviewed with the patient and are negative.  PHYSICAL EXAMINATION: ECOG PERFORMANCE STATUS: 1 - Symptomatic but completely ambulatory  Vitals:   05/26/23 1021  BP: (!) 162/86  Pulse: 66  Temp: 97.7 F (36.5 C)  SpO2: 100%    Filed Weights   05/26/23 1021  Weight: 185 lb (83.9 kg)     GENERAL:alert, no distress and comfortable SKIN: skin color, texture, turgor are normal, no rashes or significant lesions EYES: normal, conjunctiva are pink and non-injected, sclera clear OROPHARYNX:no exudate, no erythema and lips, buccal mucosa, and tongue normal  NECK: supple, thyroid normal size, non-tender, without nodularity LYMPH:  no  palpable lymphadenopathy in the cervical, axillary or inguinal LUNGS: clear to auscultation and percussion with normal breathing effort HEART:  regular rate & rhythm and no murmurs and no lower extremity edema ABDOMEN:abdomen soft, non-tender and normal bowel sounds Musculoskeletal:no cyanosis of digits and no clubbing  PSYCH: alert & oriented x 3 with fluent speech NEURO: no focal motor/sensory deficits  LABORATORY DATA:  I have reviewed the data as listed Lab Results  Component Value Date   WBC 7.8 05/26/2023   HGB 13.9 05/26/2023   HCT 40.4 05/26/2023   MCV 94.0 05/26/2023   PLT 176 05/26/2023   Recent Labs    01/06/23 0930 01/27/23 0844 05/14/23 1703 05/26/23 1005  NA 137 138  --  136  K 3.6 3.8  --  4.0  CL 107 109  --  107  CO2 22 23  --  23  GLUCOSE 124* 124*  --  109*  BUN 13 15  --  18  CREATININE 0.92 0.78 1.10 1.04  CALCIUM 8.8* 9.2  --  8.9  GFRNONAA >60 >60  --  >60  PROT 6.3* 6.9  --  6.8  ALBUMIN 3.6 3.9  --  4.0  AST 23 36  --  20  ALT 15 24  --  18  ALKPHOS 99 71  --  109  BILITOT 0.6 0.5  --  0.7    RADIOGRAPHIC STUDIES: I have personally reviewed the radiological images as listed and agreed with the findings in the report. CT CHEST ABDOMEN PELVIS W CONTRAST  Result Date: 05/25/2023 CLINICAL DATA:  Transverse colon cancer restaging, status post resection * Tracking Code: BO * EXAM: CT CHEST, ABDOMEN, AND PELVIS WITH CONTRAST TECHNIQUE: Multidetector CT imaging of the chest, abdomen and pelvis was performed following the standard protocol during bolus administration of intravenous contrast. RADIATION DOSE REDUCTION: This exam was performed according to the departmental dose-optimization program which includes automated exposure control, adjustment of the mA and/or kV according to patient size and/or use of iterative reconstruction technique. CONTRAST:  OMNIPAQUE IOHEXOL 300 MG/ML SOLN additional oral enteric contrast COMPARISON:  11/12/2022 FINDINGS:  CT CHEST FINDINGS Cardiovascular: Right chest port catheter. Aortic atherosclerosis. Normal heart size. Left and right coronary artery calcifications. Enlargement of the main pulmonary artery measuring up to 4.0 cm in caliber. No pericardial effusion. Mediastinum/Nodes: No enlarged mediastinal, hilar, or axillary lymph nodes. Thyroid gland, trachea, and esophagus demonstrate no significant findings. Lungs/Pleura: Diffuse bilateral bronchial wall thickening. Background of fine centrilobular pulmonary nodularity, most concentrated in the lung apices. No pleural effusion or pneumothorax. Musculoskeletal: No chest wall abnormality. No acute osseous findings. CT ABDOMEN PELVIS FINDINGS Hepatobiliary: No solid liver abnormality is seen. Multiple gallstones contracted in the gallbladder. No gallbladder wall thickening, or biliary dilatation. Pancreas: Unremarkable. No pancreatic ductal dilatation or surrounding inflammatory changes. Spleen: Normal in size without significant abnormality. Adrenals/Urinary Tract: Adrenal glands are unremarkable. Small nonobstructive calculus of the superior pole of the left kidney. No ureteral calculi or hydronephrosis. Unchanged jackstone calculus in the bladder measuring 1.0 cm (series 2, image 107). Stomach/Bowel: Stomach is within normal limits. Status post abdominoperineal resection with left lower quadrant end colostomy, as well as additional resection and reanastomosis of the splenic flexure. Unchanged, chronic, partially calcified presacral fluid collection (series 2, image 109). Vascular/Lymphatic: Aortic atherosclerosis. No enlarged abdominal or pelvic lymph nodes. Reproductive: No mass or other abnormality. Other: Left lower quadrant end colostomy with similar parastomal hernia containing nonobstructed colon (series 2, image 105). Small, fat containing bilateral inguinal hernias. No ascites. Musculoskeletal: No acute osseous findings. IMPRESSION: 1. Status post abdominoperineal  resection with left lower quadrant  end colostomy, as well as additional resection and reanastomosis of the splenic flexure. 2. No evidence of lymphadenopathy or metastatic disease in the chest, abdomen, or pelvis. 3. Similar left lower quadrant parastomal hernia. 4. Cholelithiasis. 5. Nonobstructive left nephrolithiasis. Unchanged jackstone calculus in the bladder measuring 1.0 cm. 6. Diffuse bilateral bronchial wall thickening and background of fine centrilobular pulmonary nodularity, most concentrated in the lung apices, most commonly seen in smoking-related respiratory bronchiolitis. 7. Enlargement of the main pulmonary artery, as can be seen in pulmonary hypertension. 8. Coronary artery disease. Aortic Atherosclerosis (ICD10-I70.0). Electronically Signed   By: Jearld Lesch M.D.   On: 05/25/2023 16:36

## 2023-05-26 NOTE — Progress Notes (Signed)
Patient is having right shoulder replacement on December 11th, and he needs medical clearance before then.

## 2023-05-27 ENCOUNTER — Telehealth: Payer: Self-pay

## 2023-05-27 ENCOUNTER — Other Ambulatory Visit: Payer: Self-pay

## 2023-05-27 LAB — CEA: CEA: 2.4 ng/mL (ref 0.0–4.7)

## 2023-05-27 NOTE — Telephone Encounter (Signed)
Faxed over Dr. Alan Ripper progress note to Dr. Odis Luster at [facility] today. He doesn't have a PCP.

## 2023-06-04 ENCOUNTER — Encounter: Payer: Self-pay | Admitting: Internal Medicine

## 2023-06-04 ENCOUNTER — Other Ambulatory Visit: Payer: Self-pay | Admitting: Internal Medicine

## 2023-06-04 ENCOUNTER — Encounter: Payer: Self-pay | Admitting: Pulmonary Disease

## 2023-06-04 MED ORDER — GABAPENTIN 100 MG PO CAPS: 200.0000 mg | ORAL_CAPSULE | Freq: Three times a day (TID) | ORAL | 0 refills | Status: DC

## 2023-06-06 ENCOUNTER — Other Ambulatory Visit: Payer: Self-pay | Admitting: Internal Medicine

## 2023-06-06 MED ORDER — GABAPENTIN 100 MG PO CAPS: 200.0000 mg | ORAL_CAPSULE | Freq: Three times a day (TID) | ORAL | 0 refills | Status: DC

## 2023-06-16 DIAGNOSIS — Z0189 Encounter for other specified special examinations: Secondary | ICD-10-CM | POA: Diagnosis not present

## 2023-06-20 DIAGNOSIS — Z933 Colostomy status: Secondary | ICD-10-CM | POA: Diagnosis not present

## 2023-06-27 ENCOUNTER — Other Ambulatory Visit: Payer: Self-pay | Admitting: Orthopedic Surgery

## 2023-06-27 ENCOUNTER — Ambulatory Visit: Payer: PPO | Attending: Orthopedic Surgery

## 2023-06-27 DIAGNOSIS — M129 Arthropathy, unspecified: Secondary | ICD-10-CM

## 2023-06-27 DIAGNOSIS — Z01812 Encounter for preprocedural laboratory examination: Secondary | ICD-10-CM | POA: Diagnosis not present

## 2023-06-27 DIAGNOSIS — M19011 Primary osteoarthritis, right shoulder: Secondary | ICD-10-CM

## 2023-06-27 DIAGNOSIS — Q2579 Other congenital malformations of pulmonary artery: Secondary | ICD-10-CM | POA: Diagnosis not present

## 2023-06-30 DIAGNOSIS — M129 Arthropathy, unspecified: Secondary | ICD-10-CM | POA: Diagnosis not present

## 2023-06-30 DIAGNOSIS — M19011 Primary osteoarthritis, right shoulder: Secondary | ICD-10-CM | POA: Diagnosis not present

## 2023-07-01 DIAGNOSIS — M19011 Primary osteoarthritis, right shoulder: Secondary | ICD-10-CM | POA: Diagnosis not present

## 2023-07-02 DIAGNOSIS — M75101 Unspecified rotator cuff tear or rupture of right shoulder, not specified as traumatic: Secondary | ICD-10-CM | POA: Diagnosis not present

## 2023-07-02 DIAGNOSIS — D509 Iron deficiency anemia, unspecified: Secondary | ICD-10-CM | POA: Diagnosis not present

## 2023-07-02 DIAGNOSIS — Z23 Encounter for immunization: Secondary | ICD-10-CM | POA: Diagnosis not present

## 2023-07-02 DIAGNOSIS — G4700 Insomnia, unspecified: Secondary | ICD-10-CM | POA: Diagnosis not present

## 2023-07-02 DIAGNOSIS — M25711 Osteophyte, right shoulder: Secondary | ICD-10-CM | POA: Diagnosis not present

## 2023-07-02 DIAGNOSIS — M19011 Primary osteoarthritis, right shoulder: Secondary | ICD-10-CM | POA: Diagnosis not present

## 2023-07-02 DIAGNOSIS — G620 Drug-induced polyneuropathy: Secondary | ICD-10-CM | POA: Diagnosis not present

## 2023-07-02 DIAGNOSIS — Z7982 Long term (current) use of aspirin: Secondary | ICD-10-CM | POA: Diagnosis not present

## 2023-07-02 DIAGNOSIS — Z85038 Personal history of other malignant neoplasm of large intestine: Secondary | ICD-10-CM | POA: Diagnosis not present

## 2023-07-02 DIAGNOSIS — G8918 Other acute postprocedural pain: Secondary | ICD-10-CM | POA: Diagnosis not present

## 2023-07-08 DIAGNOSIS — M19011 Primary osteoarthritis, right shoulder: Secondary | ICD-10-CM | POA: Diagnosis not present

## 2023-07-09 DIAGNOSIS — M19011 Primary osteoarthritis, right shoulder: Secondary | ICD-10-CM | POA: Diagnosis not present

## 2023-07-11 DIAGNOSIS — M19011 Primary osteoarthritis, right shoulder: Secondary | ICD-10-CM | POA: Diagnosis not present

## 2023-07-14 DIAGNOSIS — M19011 Primary osteoarthritis, right shoulder: Secondary | ICD-10-CM | POA: Diagnosis not present

## 2023-07-21 DIAGNOSIS — M19011 Primary osteoarthritis, right shoulder: Secondary | ICD-10-CM | POA: Diagnosis not present

## 2023-07-25 ENCOUNTER — Inpatient Hospital Stay: Payer: PPO | Attending: Internal Medicine

## 2023-07-25 VITALS — BP 177/89 | HR 75 | Temp 97.8°F | Resp 18

## 2023-07-25 DIAGNOSIS — Z95828 Presence of other vascular implants and grafts: Secondary | ICD-10-CM

## 2023-07-25 DIAGNOSIS — Z452 Encounter for adjustment and management of vascular access device: Secondary | ICD-10-CM | POA: Diagnosis not present

## 2023-07-25 DIAGNOSIS — M19011 Primary osteoarthritis, right shoulder: Secondary | ICD-10-CM | POA: Diagnosis not present

## 2023-07-25 DIAGNOSIS — C184 Malignant neoplasm of transverse colon: Secondary | ICD-10-CM | POA: Diagnosis not present

## 2023-07-25 MED ORDER — HEPARIN SOD (PORK) LOCK FLUSH 100 UNIT/ML IV SOLN
500.0000 [IU] | Freq: Once | INTRAVENOUS | Status: AC
  Administered 2023-07-25: 500 [IU] via INTRAVENOUS
  Filled 2023-07-25: qty 5

## 2023-07-25 MED ORDER — SODIUM CHLORIDE 0.9% FLUSH
10.0000 mL | Freq: Once | INTRAVENOUS | Status: AC
  Administered 2023-07-25: 10 mL via INTRAVENOUS
  Filled 2023-07-25: qty 10

## 2023-07-31 DIAGNOSIS — M19011 Primary osteoarthritis, right shoulder: Secondary | ICD-10-CM | POA: Diagnosis not present

## 2023-08-11 DIAGNOSIS — Z96611 Presence of right artificial shoulder joint: Secondary | ICD-10-CM | POA: Diagnosis not present

## 2023-08-13 DIAGNOSIS — Z933 Colostomy status: Secondary | ICD-10-CM | POA: Diagnosis not present

## 2023-08-18 ENCOUNTER — Other Ambulatory Visit: Payer: Self-pay | Admitting: Internal Medicine

## 2023-08-18 MED ORDER — GABAPENTIN 100 MG PO CAPS: 200.0000 mg | ORAL_CAPSULE | Freq: Three times a day (TID) | ORAL | 0 refills | Status: DC

## 2023-08-26 ENCOUNTER — Inpatient Hospital Stay: Payer: PPO | Attending: Internal Medicine

## 2023-08-26 ENCOUNTER — Inpatient Hospital Stay: Payer: PPO | Admitting: Internal Medicine

## 2023-08-26 ENCOUNTER — Encounter: Payer: Self-pay | Admitting: Internal Medicine

## 2023-08-26 VITALS — BP 146/82 | HR 70 | Temp 98.1°F | Resp 18 | Wt 186.0 lb

## 2023-08-26 DIAGNOSIS — Z79899 Other long term (current) drug therapy: Secondary | ICD-10-CM | POA: Diagnosis not present

## 2023-08-26 DIAGNOSIS — G620 Drug-induced polyneuropathy: Secondary | ICD-10-CM | POA: Insufficient documentation

## 2023-08-26 DIAGNOSIS — M1990 Unspecified osteoarthritis, unspecified site: Secondary | ICD-10-CM | POA: Diagnosis not present

## 2023-08-26 DIAGNOSIS — Z862 Personal history of diseases of the blood and blood-forming organs and certain disorders involving the immune mechanism: Secondary | ICD-10-CM | POA: Diagnosis not present

## 2023-08-26 DIAGNOSIS — D509 Iron deficiency anemia, unspecified: Secondary | ICD-10-CM

## 2023-08-26 DIAGNOSIS — Z85038 Personal history of other malignant neoplasm of large intestine: Secondary | ICD-10-CM | POA: Diagnosis not present

## 2023-08-26 DIAGNOSIS — C184 Malignant neoplasm of transverse colon: Secondary | ICD-10-CM | POA: Diagnosis not present

## 2023-08-26 DIAGNOSIS — Z9221 Personal history of antineoplastic chemotherapy: Secondary | ICD-10-CM | POA: Diagnosis not present

## 2023-08-26 DIAGNOSIS — T451X5A Adverse effect of antineoplastic and immunosuppressive drugs, initial encounter: Secondary | ICD-10-CM | POA: Diagnosis not present

## 2023-08-26 LAB — IRON AND TIBC
Iron: 123 ug/dL (ref 45–182)
Saturation Ratios: 37 % (ref 17.9–39.5)
TIBC: 336 ug/dL (ref 250–450)
UIBC: 213 ug/dL

## 2023-08-26 LAB — CBC WITH DIFFERENTIAL (CANCER CENTER ONLY)
Abs Immature Granulocytes: 0.02 10*3/uL (ref 0.00–0.07)
Basophils Absolute: 0.1 10*3/uL (ref 0.0–0.1)
Basophils Relative: 2 %
Eosinophils Absolute: 0.3 10*3/uL (ref 0.0–0.5)
Eosinophils Relative: 3 %
HCT: 40.6 % (ref 39.0–52.0)
Hemoglobin: 14.2 g/dL (ref 13.0–17.0)
Immature Granulocytes: 0 %
Lymphocytes Relative: 34 %
Lymphs Abs: 2.9 10*3/uL (ref 0.7–4.0)
MCH: 32.3 pg (ref 26.0–34.0)
MCHC: 35 g/dL (ref 30.0–36.0)
MCV: 92.5 fL (ref 80.0–100.0)
Monocytes Absolute: 0.8 10*3/uL (ref 0.1–1.0)
Monocytes Relative: 9 %
Neutro Abs: 4.6 10*3/uL (ref 1.7–7.7)
Neutrophils Relative %: 52 %
Platelet Count: 192 10*3/uL (ref 150–400)
RBC: 4.39 MIL/uL (ref 4.22–5.81)
RDW: 13.4 % (ref 11.5–15.5)
WBC Count: 8.6 10*3/uL (ref 4.0–10.5)
nRBC: 0 % (ref 0.0–0.2)

## 2023-08-26 LAB — FERRITIN: Ferritin: 45 ng/mL (ref 24–336)

## 2023-08-26 LAB — CMP (CANCER CENTER ONLY)
ALT: 21 U/L (ref 0–44)
AST: 25 U/L (ref 15–41)
Albumin: 4.2 g/dL (ref 3.5–5.0)
Alkaline Phosphatase: 86 U/L (ref 38–126)
Anion gap: 9 (ref 5–15)
BUN: 20 mg/dL (ref 8–23)
CO2: 23 mmol/L (ref 22–32)
Calcium: 9.1 mg/dL (ref 8.9–10.3)
Chloride: 104 mmol/L (ref 98–111)
Creatinine: 1.09 mg/dL (ref 0.61–1.24)
GFR, Estimated: >60 mL/min (ref >60)
Glucose, Bld: 109 mg/dL — ABNORMAL HIGH (ref 70–99)
Potassium: 4 mmol/L (ref 3.5–5.1)
Sodium: 136 mmol/L (ref 135–145)
Total Bilirubin: 0.7 mg/dL (ref 0.0–1.2)
Total Protein: 7.2 g/dL (ref 6.5–8.1)

## 2023-08-26 MED ORDER — HEPARIN SOD (PORK) LOCK FLUSH 100 UNIT/ML IV SOLN
500.0000 [IU] | Freq: Once | INTRAVENOUS | Status: AC
  Administered 2023-08-26: 500 [IU] via INTRAVENOUS
  Filled 2023-08-26: qty 5

## 2023-08-26 MED ORDER — SODIUM CHLORIDE 0.9% FLUSH
10.0000 mL | Freq: Once | INTRAVENOUS | Status: AC
  Administered 2023-08-26: 10 mL via INTRAVENOUS
  Filled 2023-08-26: qty 10

## 2023-08-26 NOTE — Progress Notes (Signed)
Referral for colonoscopy surveillance faxed to Dr. [name]- fax confirmation received. 08/26/23 at 1138 am

## 2023-08-26 NOTE — Progress Notes (Signed)
Patient had right shoulder replacement surgery in December, 2024, and he says that after he woke up from his surgery he felt so much better. He is doing well, he has no new questions or concerns for the doctor today.

## 2023-08-26 NOTE — Progress Notes (Signed)
 [facility] CONSULT NOTE  Patient Care Team: Glover Lenis, MD as PCP - General (Family Medicine) Maurie Rayfield BIRCH, RN as Oncology Nurse Navigator   CANCER STAGING   Cancer Staging  Cancer of transverse colon [facility]) Staging form: Colon and Rectum, AJCC 8th Edition - Clinical: Stage IIIC (cT4b, cN1a, cM0) - Signed by Wallis Spizzirri, MD on 07/12/2022 Stage prefix: Initial diagnosis Total positive nodes: 1 Total nodes examined: 24   ASSESSMENT & PLAN:  [name] 73 y.o. male with pmh of colon cancer s/p left sided colostomy in 2011, chemotherapy, neuropathy follows with Medical Oncology for Stage IIIC colon cancer.   #Transverse colon adenocarcinoma, Stage IIIC ([id number]), MMR proficient - presented with bowel obstruction. S/p resection and reanastomosis with Dr. Tye on 07/03/22.   -Oxaliplatin  held with cycle 4 due to worsening cold induced neuropathy.  Neuropathy has improved -> resumed oxaliplatin  50 mg/m with cycle 6.  Oxaliplatin  permanently discontinued with cycle 8.  Completed 12 cycles of 5-FU on 01/27/2023.  -CT chest abdomen pelvis from 05/14/2023-NED.  Showing dilated pulmonary trunk 4 cm and bronchiolitis changes in bilateral lung apices. -CBC and CMP unremarkable.  CEA level pending. -Scheduled for repeat CT chest abdomen pelvis with contrast in April 2025. -Last clinic visit, he was referred to GI but for some reason has not been seen or scheduled for colonoscopy.  He is overdue.  Will make referral again to GI and reach out to Dr. Estelita office whoever can get him in faster for colonoscopy 1 year postsurgery.  # Dilated pulmonary trunk -Incidentally detected on CT imaging.  Patient is asymptomatic. -Referral to pulmonary was placed.  However patient decided not to schedule and referral was closed.  # Chemotherapy induced neuropathy -Continue with gabapentin  300 mg twice daily.  Hard for him to remember 3 times daily dosing.  #  Arthritis -Patient has multiple arthritic joints including shoulders or hips.  -On oxycodone  5 mg once a day  # Iron  deficiency anemia -Resolved.   #History of colon cancer s/p left sided colostomy, chemo in 2011 -Surgery and had adjuvant chemotherapy.  No records are available.  # Access -port placed on 08/08/2022 by Dr. Tye.  Port maintenance with flushes every 3 months.      No orders of the defined types were placed in this encounter.  RTC in 3 months for MD visit, labs, discuss scan  The total time spent in the appointment was 30 minutes encounter with patients including review of chart and various tests results, discussions about plan of care and coordination of care plan   All questions were answered. The patient knows to call the clinic with any problems, questions or concerns. No barriers to learning was detected.  Genevia Rous, MD 2/4/202510:52 AM   HISTORY OF PRESENTING ILLNESS:  [name] 73 y.o. male with pmh of colon cancer s/p left sided colostomy in 2011, chemotherapy, neuropathy follows with Medical Oncology for Stage IIIC colon cancer.   Interval history Patient was seen today as follow-up accompanied with wife for stage IIIc colon cancer on surveillance now. He is doing well overall.  Had right shoulder surgery and is very happy with the results.  His pain is resolved and his range of motion has improved.  Denies any changes with bowel movement or bleeding.  Appetite is good.  Denies any weight loss.  Did not hear back from anyone about scheduling colonoscopy.  Continues to have neuropathy.  Manageable on gabapentin .  I have reviewed  his chart and materials related to his cancer extensively and collaborated history with the patient. Summary of oncologic history is as follows: Oncology History Overview Note  Patient has a history of colon cancer in 2011 treated with Dr. Michaela with radiation followed by surgery s/p left sided colostomy and adjuvant  chemo likely FOLFOX. Records unavailable.    Cancer of transverse colon (HCC)  06/29/2022 Initial Diagnosis   Presented to ED for abdominal pain, nausea and vomiting.    Imaging   CT abdo pelvis IMPRESSION: 1. Patient is status post abdominoperineal resection with left lower quadrant ostomy. The ascending and transverse colon are moderately distended and there is abrupt caliber change at the distal transverse colon/splenic flexure where there is irregular masslike thickening. Findings are suspicious for colonic obstruction due to a mass at this location. There is no dilated small bowel. 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 2. Slightly thick-walled presacral fluid collection with peripheral calcification, may reflect chronic postoperative collection. 3. Gallstones. 4. Aortic atherosclerosis.  CT chest IMPRESSION: 1. No acute intrathoracic process. No evidence of intrathoracic metastases.    Procedure   Colonoscopy on 06/29/22 by Dr. Tye A fungating partially obstructing mass in mid transverse colon.   07/03/2022 Pathology Results   DIAGNOSIS: A. 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 - SEE CANCER SUMMARY BELOW. - TATTOO INK. - SEPARATE 4.5 CM LENGTH OF SMALL INTESTINE.  CANCER CASE SUMMARY: COLON AND RECTUM Standard(s): AJCC-UICC 8  SPECIMEN Procedure: Transverse colectomy  TUMOR Tumor Site: Transverse colon Histologic Type: Adenocarcinoma Histologic Grade: G2, moderately differentiated Tumor Size: Greatest dimension: 6.1 cm Tumor Extent: Directly invades or adheres to adjacent structures (small intestine) Macroscopic Tumor Perforation: Not identified Lymphatic and/or Vascular Invasion: Large vessel (venous), extramural Perineural Invasion: Present Tumor Budding Score: High (10 or more) Treatment Effect: No known  presurgical therapy  MARGINS Margin Status for Invasive Carcinoma: All margins negative for invasive carcinoma      Margins examined: Proximal, distal, and mesenteric Margin Status for Non-Invasive Tumor: All margins negative for high-grade dysplasia/intramucosal carcinoma and low-grade dysplasia  REGIONAL LYMPH NODES Regional Lymph Nodes: Regional lymph nodes present      Tumor present in regional lymph node(s)           Number of Lymph Nodes with Tumor: 1      Number of Lymph Nodes Examined: 24  Tumor Deposits: Not identified  DISTANT METASTASES Distant Site(s) Involved, if applicable: Not applicable  PATHOLOGIC STAGE CLASSIFICATION (pTNM, AJCC 8th Edition): Modified Classification: Not applicable pT4b      T suffix: Not applicable pN1a pM not applicable - pM cannot be determined from the submitted specimen(s)    07/03/2022 Procedure   Exploratory laparotomy, en bloc colon resection, reanastomosis, primary repair of parastomal hernia by Dr. Tye     07/12/2022 Cancer Staging   Staging form: Colon and Rectum, AJCC 8th Edition - Clinical: Stage IIIC (cT4b, cN1a, cM0) - Signed by Mitzy Naron, MD on 07/12/2022 Stage prefix: Initial diagnosis Total positive nodes: 1 Total nodes examined: 24   08/12/2022 -  Chemotherapy   Patient is on Treatment Plan : COLORECTAL FOLFOX q14d x 6 months       MEDICAL HISTORY:  Past Medical History:  Diagnosis Date   Colon cancer (HCC)    remission 2011   Heart murmur    as a child from rheumatic fever   Malignant neoplasm of transverse colon (  HCC) 2024   Microcytic anemia    Neuropathy    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HCC)    Protein-calorie malnutrition, severe (HCC)     SURGICAL HISTORY: Past Surgical History:  Procedure Laterality Date   APPENDECTOMY     COLECTOMY WITH COLOSTOMY CREATION/HARTMANN PROCEDURE N/A 07/03/2022   Procedure: COLECTOMY WITH COLOSTOMY CREATION/HARTMANN PROCEDURE;  Surgeon: Tye Millet, DO;  Location:  [facility] ORS;  Service: General;  Laterality: N/A;   COLONOSCOPY N/A 06/29/2022   Procedure: COLONOSCOPY;  Surgeon: Tye Millet, DO;  Location: [facility] ENDOSCOPY;  Service: General;  Laterality: N/A;   PORTACATH PLACEMENT Right 08/08/2022   Procedure: INSERTION PORT-A-CATH;  Surgeon: Tye Millet, DO;  Location: [facility] ORS;  Service: General;  Laterality: Right;    SOCIAL HISTORY: Social History   Socioeconomic History   Marital status: Single    Spouse name: Not on file   Number of children: Not on file   Years of education: Not on file   Highest education level: Not on file  Occupational History   Not on file  Tobacco Use   Smoking status: Never   Smokeless tobacco: Never  Vaping Use   Vaping status: Never Used  Substance and Sexual Activity   Alcohol use: Not Currently   Drug use: Never   Sexual activity: Not on file  Other Topics Concern   Not on file  Social History Narrative   Not on file   Social Drivers of Health   Financial Resource Strain: Not on file  Food Insecurity: Unknown (06/29/2022)   Hunger Vital Sign    Worried About Running Out of Food in the Last Year: Never true    Ran Out of Food in the Last Year: Not on file  Transportation Needs: No Transportation Needs (06/29/2022)   PRAPARE - Administrator, Civil Service (Medical): No    Lack of Transportation (Non-Medical): No  Physical Activity: Not on file  Stress: Not on file  Social Connections: Not on file  Intimate Partner Violence: Not At Risk (06/29/2022)   Humiliation, Afraid, Rape, and Kick questionnaire    Fear of Current or Ex-Partner: No    Emotionally Abused: No    Physically Abused: No    Sexually Abused: No    FAMILY HISTORY: No family history on file.  ALLERGIES:  has no known allergies.  MEDICATIONS:  Current Outpatient Medications  Medication Sig Dispense Refill   dexamethasone  (DECADRON ) 4 MG tablet Take 1 tablet (4 mg total) by mouth daily. Start the day after chemotherapy  for 2 days. Take with food. (Patient not taking: Reported on 05/26/2023) 30 tablet 1   Docusate Sodium  (DSS) 100 MG CAPS Take 1 capsule by mouth 2 (two) times daily. As needed for mild constipation.     Ferrous Sulfate (IRON  PO) Take 1 tablet by mouth 3 (three) times a week.     gabapentin  (NEURONTIN ) 100 MG capsule Take 2 capsules (200 mg total) by mouth 3 (three) times daily. 180 capsule 0   HYDROcodone -acetaminophen  (NORCO) 5-325 MG tablet Take 1 tablet by mouth every 6 (six) hours as needed for up to 30 doses for moderate pain. (Patient not taking: Reported on 05/26/2023) 30 tablet 0   ibuprofen  (ADVIL ) 800 MG tablet 800 mg.     lidocaine -prilocaine  (EMLA ) cream Apply to affected area once 30 g 3   Multiple Vitamin (MULTIVITAMIN) tablet Take 1 tablet by mouth daily.     ondansetron  (ZOFRAN ) 4 MG tablet Take 4 mg by mouth every  8 (eight) hours as needed for nausea or vomiting. (Patient not taking: Reported on 05/26/2023)     ondansetron  (ZOFRAN ) 8 MG tablet Take 1 tablet (8 mg total) by mouth every 8 (eight) hours as needed for nausea or vomiting. Start on the third day after chemotherapy. 30 tablet 1   potassium chloride  SA (KLOR-CON  M) 20 MEQ tablet Take 1 tablet (20 mEq total) by mouth daily for 3 days. (Patient not taking: Reported on 12/02/2022) 3 tablet 0   prochlorperazine  (COMPAZINE ) 10 MG tablet Take 1 tablet (10 mg total) by mouth every 6 (six) hours as needed for nausea or vomiting. 30 tablet 1   zolpidem  (AMBIEN ) 5 MG tablet Take 1 tablet (5 mg total) by mouth at bedtime as needed for sleep. (Patient not taking: Reported on 05/26/2023) 30 tablet 0   No current facility-administered medications for this visit.   Facility-Administered Medications Ordered in Other Visits  Medication Dose Route Frequency Provider Last Rate Last Admin   heparin  lock flush 100 unit/mL  500 Units Intravenous Once Doloros Kwolek, MD       sodium chloride  flush (NS) 0.9 % injection 10 mL  10 mL Intracatheter PRN  Deandrea Vanpelt, MD   10 mL at 01/29/23 1321    REVIEW OF SYSTEMS:   Pertinent information mentioned in HPI All other systems were reviewed with the patient and are negative.  PHYSICAL EXAMINATION: ECOG PERFORMANCE STATUS: 1 - Symptomatic but completely ambulatory  There were no vitals filed for this visit.   There were no vitals filed for this visit.    GENERAL:alert, no distress and comfortable SKIN: skin color, texture, turgor are normal, no rashes or significant lesions EYES: normal, conjunctiva are pink and non-injected, sclera clear OROPHARYNX:no exudate, no erythema and lips, buccal mucosa, and tongue normal  NECK: supple, thyroid normal size, non-tender, without nodularity LYMPH:  no palpable lymphadenopathy in the cervical, axillary or inguinal LUNGS: clear to auscultation and percussion with normal breathing effort HEART: regular rate & rhythm and no murmurs and no lower extremity edema ABDOMEN:abdomen soft, non-tender and normal bowel sounds Musculoskeletal:no cyanosis of digits and no clubbing  PSYCH: alert & oriented x 3 with fluent speech NEURO: no focal motor/sensory deficits  LABORATORY DATA:  I have reviewed the data as listed Lab Results  Component Value Date   WBC 7.8 05/26/2023   HGB 13.9 05/26/2023   HCT 40.4 05/26/2023   MCV 94.0 05/26/2023   PLT 176 05/26/2023   Recent Labs    01/06/23 0930 01/27/23 0844 05/14/23 1703 05/26/23 1005  NA 137 138  --  136  K 3.6 3.8  --  4.0  CL 107 109  --  107  CO2 22 23  --  23  GLUCOSE 124* 124*  --  109*  BUN 13 15  --  18  CREATININE 0.92 0.78 1.10 1.04  CALCIUM  8.8* 9.2  --  8.9  GFRNONAA >60 >60  --  >60  PROT 6.3* 6.9  --  6.8  ALBUMIN 3.6 3.9  --  4.0  AST 23 36  --  20  ALT 15 24  --  18  ALKPHOS 99 71  --  109  BILITOT 0.6 0.5  --  0.7    RADIOGRAPHIC STUDIES: I have personally reviewed the radiological images as listed and agreed with the findings in the report. No results found.

## 2023-08-27 ENCOUNTER — Other Ambulatory Visit: Payer: Self-pay

## 2023-08-27 LAB — CEA: CEA: 2.2 ng/mL (ref 0.0–4.7)

## 2023-09-01 DIAGNOSIS — Z933 Colostomy status: Secondary | ICD-10-CM | POA: Diagnosis not present

## 2023-09-01 DIAGNOSIS — C184 Malignant neoplasm of transverse colon: Secondary | ICD-10-CM | POA: Diagnosis not present

## 2023-09-02 ENCOUNTER — Other Ambulatory Visit: Payer: Self-pay

## 2023-09-03 DIAGNOSIS — C184 Malignant neoplasm of transverse colon: Secondary | ICD-10-CM

## 2023-09-03 NOTE — Progress Notes (Signed)
Survivorship Care Plan visit completed.  Treatment summary reviewed and given to patient.  ASCO answers booklet reviewed and given to patient.  CARE program and Cancer Transitions discussed with patient along with other resources cancer center offers to patients and caregivers.  Patient verbalized understanding.

## 2023-09-10 DIAGNOSIS — Z933 Colostomy status: Secondary | ICD-10-CM | POA: Diagnosis not present

## 2023-09-25 ENCOUNTER — Other Ambulatory Visit: Payer: Self-pay | Admitting: Internal Medicine

## 2023-09-25 MED ORDER — GABAPENTIN 100 MG PO CAPS: 200.0000 mg | ORAL_CAPSULE | Freq: Three times a day (TID) | ORAL | 0 refills | Status: DC

## 2023-10-01 ENCOUNTER — Ambulatory Visit: Admitting: Anesthesiology

## 2023-10-01 ENCOUNTER — Ambulatory Visit
Admission: RE | Admit: 2023-10-01 | Discharge: 2023-10-01 | Disposition: A | Discharge status: Home / Self Care | Payer: PPO | Source: Ambulatory Visit | Attending: Surgery | Admitting: Surgery

## 2023-10-01 ENCOUNTER — Other Ambulatory Visit: Payer: Self-pay

## 2023-10-01 ENCOUNTER — Encounter: Payer: Self-pay | Admitting: Surgery

## 2023-10-01 ENCOUNTER — Encounter
Admission: RE | Disposition: A | Discharge status: Home / Self Care | Payer: Self-pay | Source: Ambulatory Visit | Attending: Surgery

## 2023-10-01 DIAGNOSIS — Z85038 Personal history of other malignant neoplasm of large intestine: Secondary | ICD-10-CM | POA: Insufficient documentation

## 2023-10-01 DIAGNOSIS — Z85048 Personal history of other malignant neoplasm of rectum, rectosigmoid junction, and anus: Secondary | ICD-10-CM | POA: Diagnosis not present

## 2023-10-01 DIAGNOSIS — Z08 Encounter for follow-up examination after completed treatment for malignant neoplasm: Secondary | ICD-10-CM | POA: Insufficient documentation

## 2023-10-01 DIAGNOSIS — Z933 Colostomy status: Secondary | ICD-10-CM | POA: Insufficient documentation

## 2023-10-01 DIAGNOSIS — Z800 Family history of malignant neoplasm of digestive organs: Secondary | ICD-10-CM | POA: Insufficient documentation

## 2023-10-01 DIAGNOSIS — Z538 Procedure and treatment not carried out for other reasons: Secondary | ICD-10-CM | POA: Insufficient documentation

## 2023-10-01 DIAGNOSIS — C184 Malignant neoplasm of transverse colon: Secondary | ICD-10-CM | POA: Diagnosis not present

## 2023-10-01 DIAGNOSIS — Q432 Other congenital functional disorders of colon: Secondary | ICD-10-CM | POA: Diagnosis not present

## 2023-10-01 HISTORY — PX: COLONOSCOPY WITH PROPOFOL: SHX5780

## 2023-10-01 SURGERY — COLONOSCOPY WITH PROPOFOL
Anesthesia: General | Site: Rectum

## 2023-10-01 MED ORDER — SODIUM CHLORIDE 0.9 % IV SOLN: INTRAVENOUS | Status: DC

## 2023-10-01 MED ORDER — PROPOFOL 10 MG/ML IV BOLUS
INTRAVENOUS | Status: DC | PRN
  Administered 2023-10-01: 60 mg via INTRAVENOUS

## 2023-10-01 MED ORDER — PROPOFOL 1000 MG/100ML IV EMUL
INTRAVENOUS | Status: AC
  Filled 2023-10-01: qty 500

## 2023-10-01 MED ORDER — LIDOCAINE HCL (CARDIAC) PF 100 MG/5ML IV SOSY
PREFILLED_SYRINGE | INTRAVENOUS | Status: DC | PRN
  Administered 2023-10-01: 100 mg via INTRAVENOUS

## 2023-10-01 MED ORDER — PROPOFOL 500 MG/50ML IV EMUL
INTRAVENOUS | Status: DC | PRN
Start: 2023-10-01 — End: 2023-10-01
  Administered 2023-10-01: 165 ug/kg/min via INTRAVENOUS

## 2023-10-01 MED ORDER — GLYCOPYRROLATE 0.2 MG/ML IJ SOLN
INTRAMUSCULAR | Status: DC | PRN
  Administered 2023-10-01: .2 mg via INTRAVENOUS

## 2023-10-01 NOTE — Anesthesia Postprocedure Evaluation (Signed)
 Anesthesia Post Note  Patient: [name]  Procedure(s) Performed: COLONOSCOPY WITH PROPOFOL (Rectum)  Patient location during evaluation: Endoscopy Anesthesia Type: General Level of consciousness: awake and alert Pain management: pain level controlled Vital Signs Assessment: post-procedure vital signs reviewed and stable Respiratory status: spontaneous breathing, nonlabored ventilation, respiratory function stable and patient connected to nasal cannula oxygen Cardiovascular status: blood pressure returned to baseline and stable Postop Assessment: no apparent nausea or vomiting Anesthetic complications: no   No notable events documented.   Last Vitals:  Vitals:   10/01/23 0820 10/01/23 0830  BP: 97/63 119/73  Pulse: 65 65  Resp: 17 (!) 22  Temp:    SpO2: 99% 96%    Last Pain:  Vitals:   10/01/23 0830  TempSrc:   PainSc: 0-No pain                 Corinda Gubler

## 2023-10-01 NOTE — Anesthesia Preprocedure Evaluation (Signed)
 Anesthesia Evaluation  Patient identified by MRN, date of birth, ID band Patient awake    Reviewed: Allergy & Precautions, NPO status , Patient's Chart, lab work & pertinent test results  History of Anesthesia Complications Negative for: history of anesthetic complications  Airway Mallampati: III  TM Distance: >3 FB Neck ROM: Full    Dental no notable dental hx. (+) Teeth Intact   Pulmonary neg pulmonary ROS, neg sleep apnea, neg COPD, Patient abstained from smoking.Not current smoker   Pulmonary exam normal breath sounds clear to auscultation       Cardiovascular Exercise Tolerance: Good METS(-) hypertension(-) CAD and (-) Past MI negative cardio ROS (-) dysrhythmias  Rhythm:Regular Rate:Normal - Systolic murmurs    Neuro/Psych  Neuromuscular disease  negative psych ROS   GI/Hepatic ,neg GERD  ,,(+)     (-) substance abuse    Endo/Other  neg diabetes    Renal/GU negative Renal ROS     Musculoskeletal   Abdominal   Peds  Hematology   Anesthesia Other Findings Past Medical History: No date: Colon cancer (HCC)     Comment:  remission 2011 No date: Heart murmur     Comment:  as a child from rheumatic fever 2024: Malignant neoplasm of transverse colon (HCC) No date: Microcytic anemia No date: Neuropathy No date: 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HCC) No date: Protein-calorie malnutrition, severe (HCC)  Reproductive/Obstetrics                             Anesthesia Physical Anesthesia Plan  ASA: 2  Anesthesia Plan: General   Post-op Pain Management: Minimal or no pain anticipated   Induction: Intravenous  PONV Risk Score and Plan: 2 and Propofol infusion, TIVA and Ondansetron  Airway Management Planned: Nasal Cannula  Additional Equipment: None  Intra-op Plan:   Post-operative Plan:   Informed Consent: I have reviewed the patients History and Physical, chart, labs and discussed the  procedure including the risks, benefits and alternatives for the proposed anesthesia with the patient or authorized representative who has indicated his/her understanding and acceptance.     Dental advisory given  Plan Discussed with: CRNA and Surgeon  Anesthesia Plan Comments: (Discussed risks of anesthesia with patient, including possibility of difficulty with spontaneous ventilation under anesthesia necessitating airway intervention, PONV, and rare risks such as cardiac or respiratory or neurological events, and allergic reactions. Discussed the role of CRNA in patient's perioperative care. Patient understands.)       Anesthesia Quick Evaluation

## 2023-10-01 NOTE — Interval H&P Note (Signed)
 History and Physical Interval Note:  10/01/2023 7:06 AM  [name]  has presented today for surgery, with the diagnosis of C18.4 hx of colon cancer.  The various methods of treatment have been discussed with the patient and family. After consideration of risks, benefits and other options for treatment, the patient has consented to  Procedure(s): COLONOSCOPY WITH PROPOFOL (N/A) as a surgical intervention.  The patient's history has been reviewed, patient examined, no change in status, stable for surgery.  I have reviewed the patient's chart and labs.  Questions were answered to the patient's satisfaction.     Io Dieujuste [name]

## 2023-10-01 NOTE — Op Note (Signed)
 [facility] Patient Name: [name] Procedure Date: 10/01/2023 7:26 AM MRN: [id number] Account #: [id number] Date of Birth: [birth year] Admit Type: Outpatient Age: 73 Room: [facility] ENDO ROOM 2 Gender: Male Note Status: Finalized Instrument Name: Prentice Docker [id number] Procedure:             Colonoscopy Indications:           High risk colon cancer surveillance: Personal history                         of colon cancer Providers:             Sung Amabile MD, MD Medicines:             Propofol per Anesthesia Complications:         No immediate complications. Procedure:             Pre-Anesthesia Assessment:                        - After reviewing the risks and benefits, the patient                         was deemed in satisfactory condition to undergo the                         procedure.                        After obtaining informed consent, the colonoscope was                         passed under direct vision. Throughout the procedure,                         the patient's blood pressure, pulse, and oxygen                         saturations were monitored continuously. The                         Colonoscope was introduced through the transverse                         colostomy with the intention of advancing to the                         cecum. The scope was advanced to the transverse colon                         before the procedure was aborted. Medications were not                         given. The colonoscopy was performed with difficulty                         due to a tortuous colon. scope not able to be passed                         beyond fascia due to inadequate lumen visualization,  severe OSA causing fascia stenosis. scope changed to                         peds and still unsuccessful Findings:               Impression:            - No specimens collected. Recommendation:        - Discharge patient to  home.                        - Resume previous diet.                        - CT colonoscopy Procedure Code(s):     --- Professional ---                        [phone], 53, Colonoscopy through stoma; diagnostic,                         including collection of specimen(s) by brushing or                         washing, when performed (separate procedure) Diagnosis Code(s):     --- Professional ---                        F62.130, Personal history of other malignant neoplasm                         of large intestine CPT copyright 2022 American Medical Association. All rights reserved. The codes documented in this report are preliminary and upon coder review may  be revised to meet current compliance requirements. Dr. Harrie Foreman, MD Sung Amabile MD, MD 10/01/2023 8:12:30 AM This report has been signed electronically. Number of Addenda: 0 Note Initiated On: 10/01/2023 7:26 AM Total Procedure Duration: 0 hours 25 minutes 30 seconds  Estimated Blood Loss:  Estimated blood loss: none.      [facility]

## 2023-10-01 NOTE — Transfer of Care (Signed)
 Immediate Anesthesia Transfer of Care Note  Patient: [name]  Procedure(s) Performed: COLONOSCOPY WITH PROPOFOL (Rectum)  Patient Location: Endoscopy Unit  Anesthesia Type:General  Level of Consciousness: drowsy and patient cooperative  Airway & Oxygen Therapy: Patient Spontanous Breathing and Patient connected to face mask oxygen  Post-op Assessment: Report given to RN and Post -op Vital signs reviewed and stable  Post vital signs: Reviewed and stable  Last Vitals:  Vitals Value Taken Time  BP 94/53 10/01/23 0810  Temp 35.9 C 10/01/23 0810  Pulse 65 10/01/23 0813  Resp 19 10/01/23 0813  SpO2 99 % 10/01/23 0813  Vitals shown include unfiled device data.  Last Pain:  Vitals:   10/01/23 0810  TempSrc: Temporal  PainSc: Asleep         Complications: No notable events documented.

## 2023-10-01 NOTE — H&P (Signed)
 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 male who returns for evaluation of above. Here to discuss f/u colonoscopy.  Past Medical History: has a past medical history of History of rectal cancer (08/10/2014), History of rheumatic fever, and Rectal cancer (CMS/HHS-HCC).  Past Surgical History: has a past surgical history that includes Appendectomy; Colonoscopy (08/10/2009); Abdominoperineal proctocolectomy (Abdominal perineal resection); PORT-A-CATH REMOVAL (05/2014); LOWER EUS (08/17/2009); Colonoscopy (09/05/2014); colectomy partial w/anastamosis (06/2022); and Insertion of Port a cath (08/08/2022).  Family History: family history includes Colon cancer in his brother; Coronary Artery Disease (Blocked arteries around heart) in his father; No Known Problems in his brother, brother, mother, sister, and son.  Social History: reports that he has never smoked. He has never used smokeless tobacco. He reports current alcohol use. He reports current drug use.  Current Medications: has a current medication list which includes the following prescription(s): azelastine, ferrous sulfate, fluticasone propionate, gabapentin, multivitamin, and sildenafil.  Allergies:  No Known Allergies  ROS:  A 15 point review of systems was performed and pertinent positives and negatives noted in HPI  Objective:    BP (!) 159/88  Pulse 73  Ht 172.7 cm (5\' 8" )  Wt 76.5 kg (168 lb 11.2 oz)  BMI 25.65 kg/m   Constitutional : No distress, cooperative, alert  Lymphatics/Throat: Supple with no lymphadenopathy  Respiratory: Clear to auscultation bilaterally  Cardiovascular: Regular rate and rhythm  Gastrointestinal: Soft, non-tender, non-distended, no organomegaly.  Musculoskeletal: Steady gait and movement  Skin: Cool and moist. 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 Risks include bleeding, perforation. Benefits include diagnostic, curative procedure if needed. Alternatives include continued observation. Pt verbalized understanding.  Will schedule  labs/images/medications/previous chart entries reviewed personally and relevant changes/updates noted above.

## 2023-10-06 ENCOUNTER — Other Ambulatory Visit: Payer: Self-pay | Admitting: Surgery

## 2023-10-06 DIAGNOSIS — Z85038 Personal history of other malignant neoplasm of large intestine: Secondary | ICD-10-CM

## 2023-10-09 DIAGNOSIS — Z933 Colostomy status: Secondary | ICD-10-CM | POA: Diagnosis not present

## 2023-10-24 ENCOUNTER — Inpatient Hospital Stay: Payer: PPO | Attending: Internal Medicine

## 2023-10-24 DIAGNOSIS — Z452 Encounter for adjustment and management of vascular access device: Secondary | ICD-10-CM | POA: Diagnosis not present

## 2023-10-24 DIAGNOSIS — C184 Malignant neoplasm of transverse colon: Secondary | ICD-10-CM | POA: Diagnosis not present

## 2023-10-24 DIAGNOSIS — Z95828 Presence of other vascular implants and grafts: Secondary | ICD-10-CM

## 2023-10-24 MED ORDER — HEPARIN SOD (PORK) LOCK FLUSH 100 UNIT/ML IV SOLN
500.0000 [IU] | Freq: Once | INTRAVENOUS | Status: AC
  Administered 2023-10-24: 500 [IU] via INTRAVENOUS
  Filled 2023-10-24: qty 5

## 2023-10-24 MED ORDER — SODIUM CHLORIDE 0.9% FLUSH
10.0000 mL | INTRAVENOUS | Status: DC | PRN
Start: 2023-10-24 — End: 2023-10-24
  Administered 2023-10-24: 10 mL via INTRAVENOUS
  Filled 2023-10-24: qty 10

## 2023-10-24 NOTE — Patient Instructions (Signed)

## 2023-11-03 ENCOUNTER — Ambulatory Visit
Admission: RE | Admit: 2023-11-03 | Discharge: 2023-11-03 | Disposition: A | Discharge status: Home / Self Care | Payer: PPO | Source: Ambulatory Visit | Attending: Internal Medicine | Admitting: Internal Medicine

## 2023-11-03 DIAGNOSIS — C184 Malignant neoplasm of transverse colon: Secondary | ICD-10-CM | POA: Insufficient documentation

## 2023-11-03 DIAGNOSIS — N200 Calculus of kidney: Secondary | ICD-10-CM | POA: Diagnosis not present

## 2023-11-03 DIAGNOSIS — K8020 Calculus of gallbladder without cholecystitis without obstruction: Secondary | ICD-10-CM | POA: Diagnosis not present

## 2023-11-03 DIAGNOSIS — C189 Malignant neoplasm of colon, unspecified: Secondary | ICD-10-CM | POA: Diagnosis not present

## 2023-11-03 MED ORDER — IOHEXOL 300 MG/ML  SOLN
100.0000 mL | Freq: Once | INTRAMUSCULAR | Status: AC | PRN
  Administered 2023-11-03: 100 mL via INTRAVENOUS

## 2023-11-06 ENCOUNTER — Other Ambulatory Visit: Payer: Self-pay | Admitting: Internal Medicine

## 2023-11-06 MED ORDER — GABAPENTIN 100 MG PO CAPS: 200.0000 mg | ORAL_CAPSULE | Freq: Three times a day (TID) | ORAL | 0 refills | Status: DC

## 2023-11-11 ENCOUNTER — Ambulatory Visit
Admission: RE | Admit: 2023-11-11 | Discharge: 2023-11-11 | Disposition: A | Discharge status: Home / Self Care | Source: Ambulatory Visit | Attending: Surgery

## 2023-11-11 DIAGNOSIS — Z933 Colostomy status: Secondary | ICD-10-CM | POA: Diagnosis not present

## 2023-11-11 DIAGNOSIS — Z85038 Personal history of other malignant neoplasm of large intestine: Secondary | ICD-10-CM | POA: Diagnosis not present

## 2023-11-13 DIAGNOSIS — Z933 Colostomy status: Secondary | ICD-10-CM | POA: Diagnosis not present

## 2023-11-24 ENCOUNTER — Other Ambulatory Visit: Payer: Self-pay | Admitting: *Deleted

## 2023-11-24 ENCOUNTER — Other Ambulatory Visit: Payer: PPO

## 2023-11-24 ENCOUNTER — Ambulatory Visit: Payer: PPO | Admitting: Internal Medicine

## 2023-11-24 DIAGNOSIS — C184 Malignant neoplasm of transverse colon: Secondary | ICD-10-CM

## 2023-11-25 ENCOUNTER — Encounter: Payer: Self-pay | Admitting: Internal Medicine

## 2023-11-25 ENCOUNTER — Inpatient Hospital Stay: Payer: PPO | Attending: Internal Medicine

## 2023-11-25 ENCOUNTER — Inpatient Hospital Stay (HOSPITAL_BASED_OUTPATIENT_CLINIC_OR_DEPARTMENT_OTHER): Payer: PPO | Admitting: Internal Medicine

## 2023-11-25 VITALS — BP 148/72 | HR 78 | Temp 98.2°F | Resp 18 | Wt 189.0 lb

## 2023-11-25 DIAGNOSIS — Z79899 Other long term (current) drug therapy: Secondary | ICD-10-CM | POA: Diagnosis not present

## 2023-11-25 DIAGNOSIS — Z9049 Acquired absence of other specified parts of digestive tract: Secondary | ICD-10-CM | POA: Diagnosis not present

## 2023-11-25 DIAGNOSIS — T451X5A Adverse effect of antineoplastic and immunosuppressive drugs, initial encounter: Secondary | ICD-10-CM | POA: Diagnosis not present

## 2023-11-25 DIAGNOSIS — Z85038 Personal history of other malignant neoplasm of large intestine: Secondary | ICD-10-CM | POA: Diagnosis not present

## 2023-11-25 DIAGNOSIS — C184 Malignant neoplasm of transverse colon: Secondary | ICD-10-CM | POA: Diagnosis not present

## 2023-11-25 DIAGNOSIS — Z9011 Acquired absence of right breast and nipple: Secondary | ICD-10-CM | POA: Diagnosis not present

## 2023-11-25 DIAGNOSIS — Z95828 Presence of other vascular implants and grafts: Secondary | ICD-10-CM

## 2023-11-25 DIAGNOSIS — G620 Drug-induced polyneuropathy: Secondary | ICD-10-CM | POA: Insufficient documentation

## 2023-11-25 DIAGNOSIS — M1990 Unspecified osteoarthritis, unspecified site: Secondary | ICD-10-CM | POA: Insufficient documentation

## 2023-11-25 DIAGNOSIS — I288 Other diseases of pulmonary vessels: Secondary | ICD-10-CM | POA: Insufficient documentation

## 2023-11-25 DIAGNOSIS — Z862 Personal history of diseases of the blood and blood-forming organs and certain disorders involving the immune mechanism: Secondary | ICD-10-CM | POA: Diagnosis not present

## 2023-11-25 LAB — CMP (CANCER CENTER ONLY)
ALT: 23 U/L (ref 0–44)
AST: 25 U/L (ref 15–41)
Albumin: 4.1 g/dL (ref 3.5–5.0)
Alkaline Phosphatase: 106 U/L (ref 38–126)
Anion gap: 9 (ref 5–15)
BUN: 19 mg/dL (ref 8–23)
CO2: 22 mmol/L (ref 22–32)
Calcium: 8.7 mg/dL — ABNORMAL LOW (ref 8.9–10.3)
Chloride: 107 mmol/L (ref 98–111)
Creatinine: 1.09 mg/dL (ref 0.61–1.24)
GFR, Estimated: >60 mL/min (ref >60)
Glucose, Bld: 96 mg/dL (ref 70–99)
Potassium: 4.1 mmol/L (ref 3.5–5.1)
Sodium: 138 mmol/L (ref 135–145)
Total Bilirubin: 0.4 mg/dL (ref 0.0–1.2)
Total Protein: 7.2 g/dL (ref 6.5–8.1)

## 2023-11-25 LAB — CBC WITH DIFFERENTIAL (CANCER CENTER ONLY)
Abs Immature Granulocytes: 0.07 10*3/uL (ref 0.00–0.07)
Basophils Absolute: 0.1 10*3/uL (ref 0.0–0.1)
Basophils Relative: 1 %
Eosinophils Absolute: 0.3 10*3/uL (ref 0.0–0.5)
Eosinophils Relative: 3 %
HCT: 40.6 % (ref 39.0–52.0)
Hemoglobin: 14 g/dL (ref 13.0–17.0)
Immature Granulocytes: 1 %
Lymphocytes Relative: 29 %
Lymphs Abs: 3.1 10*3/uL (ref 0.7–4.0)
MCH: 32 pg (ref 26.0–34.0)
MCHC: 34.5 g/dL (ref 30.0–36.0)
MCV: 92.7 fL (ref 80.0–100.0)
Monocytes Absolute: 0.9 10*3/uL (ref 0.1–1.0)
Monocytes Relative: 9 %
Neutro Abs: 6.3 10*3/uL (ref 1.7–7.7)
Neutrophils Relative %: 57 %
Platelet Count: 181 10*3/uL (ref 150–400)
RBC: 4.38 MIL/uL (ref 4.22–5.81)
RDW: 13.1 % (ref 11.5–15.5)
WBC Count: 10.8 10*3/uL — ABNORMAL HIGH (ref 4.0–10.5)
nRBC: 0 % (ref 0.0–0.2)

## 2023-11-25 MED ORDER — HEPARIN SOD (PORK) LOCK FLUSH 100 UNIT/ML IV SOLN
500.0000 [IU] | Freq: Once | INTRAVENOUS | Status: AC
  Administered 2023-11-25: 500 [IU] via INTRAVENOUS
  Filled 2023-11-25: qty 5

## 2023-11-25 MED ORDER — SODIUM CHLORIDE 0.9% FLUSH
10.0000 mL | INTRAVENOUS | Status: DC | PRN
Start: 2023-11-25 — End: 2023-11-25
  Administered 2023-11-25: 10 mL via INTRAVENOUS
  Filled 2023-11-25: qty 10

## 2023-11-25 NOTE — Progress Notes (Signed)
 [facility] CONSULT NOTE  Patient Care Team: Rory Collard, MD as PCP - General (Family Medicine) Rochell Chroman, RN as Oncology Nurse Navigator Conrado Delay, DO as Consulting Physician (General Surgery) Casson Catena, MD as Consulting Physician (Oncology)   CANCER STAGING   Cancer Staging  Cancer of transverse colon [facility]) Staging form: Colon and Rectum, AJCC 8th Edition - Clinical: Stage IIIC (cT4b, cN1a, cM0) - Signed by Simone Tuckey, MD on 07/12/2022 Stage prefix: Initial diagnosis Total positive nodes: 1 Total nodes examined: 24   ASSESSMENT & PLAN:  [name] 73 y.o. male with pmh of colon cancer s/p left sided colostomy in 2011, chemotherapy, neuropathy follows with Medical Oncology for Stage IIIC colon cancer.   #Transverse colon adenocarcinoma, Stage IIIC ([id number]), MMR proficient - presented with bowel obstruction. S/p resection and reanastomosis with Dr. Rosea Conch on 07/03/22.   -Oxaliplatin  held with cycle 4 due to worsening cold induced neuropathy.  Neuropathy has improved -> resumed oxaliplatin  50 mg/m with cycle 6.  Oxaliplatin  permanently discontinued with cycle 8.  Completed 12 cycles of 5-FU on 01/27/2023.  - CT chest abdomen pelvis reviewed from April 2025 - NED. Stable pulmonary nodules since 2011, stable surgical changes of APR with fluid collection.  Left-sided nonobstructing renal stones.  Pleural calcifications on the right side.  I curb sided Dr. Darnelle Elders with pleural calcification appears to be benign likely from prior infection.  - Labs reviewed.  CBC and CMP unremarkable.  CEA pending.  Will continue with follow-up visits every 3 months.  Repeat CT imaging due in October 2025.  -1 year postsurgery colonoscopy with Dr. Godwin Lat could not be completed due to tortuous colon.  Scope could not be passed beyond fascia due to inadequate lumen visualization.  Scope changed to peds and still unacceptable. Had CT colonoscopy on  11/11/2023.  Results are pending.  # Dilated pulmonary trunk -Incidentally detected on CT imaging.  Patient is asymptomatic. -Referral to pulmonary was placed.  However patient decided not to schedule and referral was closed.  # Chemotherapy induced neuropathy -Continue with gabapentin  300 mg twice daily.  Hard for him to remember 3 times daily dosing.  # Arthritis -Patient has multiple arthritic joints including shoulders or hips.  -On oxycodone  5 mg once a day  # Iron  deficiency anemia -Resolved.   #History of colon cancer s/p left sided colostomy, chemo in 2011 -Surgery and had adjuvant chemotherapy.    # Access -port placed on 08/08/2022 by Dr. Rosea Conch.  Port maintenance with flushes every 6 weeks.      Orders Placed This Encounter  Procedures   CMP (Cancer Center only)    Standing Status:   Future    Expected Date:   02/25/2024    Expiration Date:   11/24/2024   CBC with Differential (Cancer Center Only)    Standing Status:   Future    Expected Date:   02/25/2024    Expiration Date:   11/24/2024   CEA    Standing Status:   Future    Expected Date:   02/25/2024    Expiration Date:   11/24/2024   RTC in 3 months for MD visit, labs  The total time spent in the appointment was 30 minutes encounter with patients including review of chart and various tests results, discussions about plan of care and coordination of care plan   All questions were answered. The patient knows to call the clinic with any problems, questions or concerns. No barriers to learning  was detected.  Loreatha Rodney, MD 5/6/202512:05 PM   HISTORY OF PRESENTING ILLNESS:  [name] 73 y.o. male with pmh of colon cancer s/p left sided colostomy in 2011, chemotherapy, neuropathy follows with Medical Oncology for Stage IIIC colon cancer.   Interval history Patient was seen today as follow-up accompanied with wife for stage IIIc colon cancer on surveillance now.  He is doing well overall.  Reports that his  left inguinal hernia has come back.  Continues to have neuropathy and taking gabapentin  which helps. Denies any recent changes with the bowel movements, black tarry stools.  Appetite is good.  Weight gain. Denies shortness of breath, chest pain  I have reviewed his chart and materials related to his cancer extensively and collaborated history with the patient. Summary of oncologic history is as follows: Oncology History Overview Note  Patient has a history of colon cancer in 2011 treated with Dr. Dudley Ghee with radiation followed by surgery s/p left sided colostomy and adjuvant chemo likely FOLFOX. Records unavailable.    Cancer of transverse colon (HCC)  06/29/2022 Initial Diagnosis   Presented to ED for abdominal pain, nausea and vomiting.    Imaging   CT abdo pelvis IMPRESSION: 1. Patient is status post abdominoperineal resection with left lower quadrant ostomy. The ascending and transverse colon are moderately distended and there is abrupt caliber change at the distal transverse colon/splenic flexure where there is irregular masslike thickening. Findings are suspicious for colonic obstruction due to a mass at this location. There is no dilated small bowel. 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 2. Slightly thick-walled presacral fluid collection with peripheral calcification, may reflect chronic postoperative collection. 3. Gallstones. 4. Aortic atherosclerosis.  CT chest IMPRESSION: 1. No acute intrathoracic process. No evidence of intrathoracic metastases.    Procedure   Colonoscopy on 06/29/22 by Dr. Rosea Conch A fungating partially obstructing mass in mid transverse colon.   07/03/2022 Pathology Results   DIAGNOSIS: A. 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 - SEE CANCER SUMMARY BELOW. - TATTOO INK. - SEPARATE 4.5 CM LENGTH OF SMALL  INTESTINE.  CANCER CASE SUMMARY: COLON AND RECTUM Standard(s): AJCC-UICC 8  SPECIMEN Procedure: Transverse colectomy  TUMOR Tumor Site: Transverse colon Histologic Type: Adenocarcinoma Histologic Grade: G2, moderately differentiated Tumor Size: Greatest dimension: 6.1 cm Tumor Extent: Directly invades or adheres to adjacent structures (small intestine) Macroscopic Tumor Perforation: Not identified Lymphatic and/or Vascular Invasion: Large vessel (venous), extramural Perineural Invasion: Present Tumor Budding Score: High (10 or more) Treatment Effect: No known presurgical therapy  MARGINS Margin Status for Invasive Carcinoma: All margins negative for invasive carcinoma      Margins examined: Proximal, distal, and mesenteric Margin Status for Non-Invasive Tumor: All margins negative for high-grade dysplasia/intramucosal carcinoma and low-grade dysplasia  REGIONAL LYMPH NODES Regional Lymph Nodes: Regional lymph nodes present      Tumor present in regional lymph node(s)           Number of Lymph Nodes with Tumor: 1      Number of Lymph Nodes Examined: 24  Tumor Deposits: Not identified  DISTANT METASTASES Distant Site(s) Involved, if applicable: Not applicable  PATHOLOGIC STAGE CLASSIFICATION (pTNM, AJCC 8th Edition): Modified Classification: Not applicable pT4b      T suffix: Not applicable pN1a pM not applicable - pM cannot be determined from the submitted specimen(s)    07/03/2022 Procedure   Exploratory laparotomy, en bloc colon resection,  reanastomosis, primary repair of parastomal hernia by Dr. Rosea Conch     07/12/2022 Cancer Staging   Staging form: Colon and Rectum, AJCC 8th Edition - Clinical: Stage IIIC (cT4b, cN1a, cM0) - Signed by Loreatha Rodney, MD on 07/12/2022 Stage prefix: Initial diagnosis Total positive nodes: 1 Total nodes examined: 24   08/12/2022 -  Chemotherapy   Patient is on Treatment Plan : COLORECTAL FOLFOX q14d x 6 months        MEDICAL HISTORY:  Past Medical History:  Diagnosis Date   Colon cancer (HCC)    remission 2011   Heart murmur    as a child from rheumatic fever   Malignant neoplasm of transverse colon (HCC) 2024   Microcytic anemia    Neuropathy    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HCC)    Protein-calorie malnutrition, severe (HCC)     SURGICAL HISTORY: Past Surgical History:  Procedure Laterality Date   APPENDECTOMY     COLECTOMY WITH COLOSTOMY CREATION/HARTMANN PROCEDURE N/A 07/03/2022   Procedure: COLECTOMY WITH COLOSTOMY CREATION/HARTMANN PROCEDURE;  Surgeon: Conrado Delay, DO;  Location: [facility] ORS;  Service: General;  Laterality: N/A;   COLONOSCOPY N/A 06/29/2022   Procedure: COLONOSCOPY;  Surgeon: Conrado Delay, DO;  Location: [facility] END[facility];  Service: General;  Laterality: N/A;   COLONOSCOPY WITH PROPOFOL  N/A 10/01/2023   Procedure: COLONOSCOPY WITH PROPOFOL ;  Surgeon: Conrado Delay, DO;  Location: [facility] END[facility];  Service: General;  Laterality: N/A;   PORTACATH PLACEMENT Right 08/08/2022   Procedure: INSERTION PORT-A-CATH;  Surgeon: Conrado Delay, DO;  Location: [facility] ORS;  Service: General;  Laterality: Right;    SOCIAL HISTORY: Social History   Socioeconomic History   Marital status: Single    Spouse name: Not on file   Number of children: Not on file   Years of education: Not on file   Highest education level: Not on file  Occupational History   Not on file  Tobacco Use   Smoking status: Never   Smokeless tobacco: Never  Vaping Use   Vaping status: Never Used  Substance and Sexual Activity   Alcohol use: Not Currently   Drug use: Never   Sexual activity: Not on file  Other Topics Concern   Not on file  Social History Narrative   Not on file   Social Drivers of Health   Financial Resource Strain: Not on file  Food Insecurity: Unknown (06/29/2022)   Hunger Vital Sign    Worried About Running Out of Food in the Last Year: Never true    Ran Out of Food in the Last Year: Not on  file  Transportation Needs: No Transportation Needs (06/29/2022)   PRAPARE - Administrator, Civil Service (Medical): No    Lack of Transportation (Non-Medical): No  Physical Activity: Not on file  Stress: Not on file  Social Connections: Not on file  Intimate Partner Violence: Not At Risk (06/29/2022)   Humiliation, Afraid, Rape, and Kick questionnaire    Fear of Current or Ex-Partner: No    Emotionally Abused: No    Physically Abused: No    Sexually Abused: No    FAMILY HISTORY: History reviewed. No pertinent family history.  ALLERGIES:  has no known allergies.  MEDICATIONS:  Current Outpatient Medications  Medication Sig Dispense Refill   dexamethasone  (DECADRON ) 4 MG tablet Take 1 tablet (4 mg total) by mouth daily. Start the day after chemotherapy for 2 days. Take with food. (Patient not taking: Reported on 10/01/2023) 30 tablet 1   Docusate  Sodium (DSS) 100 MG CAPS Take 1 capsule by mouth 2 (two) times daily. As needed for mild constipation.     Ferrous Sulfate (IRON  PO) Take 1 tablet by mouth 3 (three) times a week.     gabapentin  (NEURONTIN ) 100 MG capsule Take 2 capsules (200 mg total) by mouth 3 (three) times daily. 180 capsule 0   ibuprofen  (ADVIL ) 800 MG tablet 800 mg.     lidocaine -prilocaine  (EMLA ) cream Apply to affected area once 30 g 3   Multiple Vitamin (MULTIVITAMIN) tablet Take 1 tablet by mouth daily.     ondansetron  (ZOFRAN ) 4 MG tablet Take 4 mg by mouth every 8 (eight) hours as needed for nausea or vomiting. (Patient not taking: Reported on 08/26/2023)     ondansetron  (ZOFRAN ) 8 MG tablet Take 1 tablet (8 mg total) by mouth every 8 (eight) hours as needed for nausea or vomiting. Start on the third day after chemotherapy. (Patient not taking: Reported on 10/01/2023) 30 tablet 1   potassium chloride  SA (KLOR-CON  M) 20 MEQ tablet Take 1 tablet (20 mEq total) by mouth daily for 3 days. (Patient not taking: Reported on 08/26/2023) 3 tablet 0   prochlorperazine   (COMPAZINE ) 10 MG tablet Take 1 tablet (10 mg total) by mouth every 6 (six) hours as needed for nausea or vomiting. 30 tablet 1   zolpidem  (AMBIEN ) 5 MG tablet Take 1 tablet (5 mg total) by mouth at bedtime as needed for sleep. (Patient not taking: Reported on 08/26/2023) 30 tablet 0   No current facility-administered medications for this visit.   Facility-Administered Medications Ordered in Other Visits  Medication Dose Route Frequency Provider Last Rate Last Admin   heparin  lock flush 100 unit/mL  500 Units Intravenous Once Charlayne Vultaggio, MD       sodium chloride  flush (NS) 0.9 % injection 10 mL  10 mL Intracatheter PRN Maveryck Bahri, MD   10 mL at 01/29/23 1321   sodium chloride  flush (NS) 0.9 % injection 10 mL  10 mL Intravenous PRN Agapita Savarino, MD   10 mL at 11/25/23 1038    REVIEW OF SYSTEMS:   Pertinent information mentioned in HPI All other systems were reviewed with the patient and are negative.  PHYSICAL EXAMINATION: ECOG PERFORMANCE STATUS: 1 - Symptomatic but completely ambulatory  Vitals:   11/25/23 1055  BP: (!) 148/72  Pulse: 78  Resp: 18  Temp: 98.2 F (36.8 C)  SpO2: 98%     Filed Weights   11/25/23 1055  Weight: 189 lb (85.7 kg)      GENERAL:alert, no distress and comfortable SKIN: skin color, texture, turgor are normal, no rashes or significant lesions EYES: normal, conjunctiva are pink and non-injected, sclera clear OROPHARYNX:no exudate, no erythema and lips, buccal mucosa, and tongue normal  NECK: supple, thyroid normal size, non-tender, without nodularity LYMPH:  no palpable lymphadenopathy in the cervical, axillary or inguinal LUNGS: clear to auscultation and percussion with normal breathing effort HEART: regular rate & rhythm and no murmurs and no lower extremity edema ABDOMEN:abdomen soft, non-tender and normal bowel sounds Musculoskeletal:no cyanosis of digits and no clubbing  PSYCH: alert & oriented x 3 with fluent speech NEURO: no  focal motor/sensory deficits  LABORATORY DATA:  I have reviewed the data as listed Lab Results  Component Value Date   WBC 10.8 (H) 11/25/2023   HGB 14.0 11/25/2023   HCT 40.6 11/25/2023   MCV 92.7 11/25/2023   PLT 181 11/25/2023   Recent Labs  05/26/23 1005 08/26/23 1040 11/25/23 1033  NA 136 136 138  K 4.0 4.0 4.1  CL 107 104 107  CO2 23 23 22   GLUCOSE 109* 109* 96  BUN 18 20 19   CREATININE 1.04 1.09 1.09  CALCIUM  8.9 9.1 8.7*  GFRNONAA >60 >60 >60  PROT 6.8 7.2 7.2  ALBUMIN 4.0 4.2 4.1  AST 20 25 25   ALT 18 21 23   ALKPHOS 109 86 106  BILITOT 0.7 0.7 0.4    RADIOGRAPHIC STUDIES: I have personally reviewed the radiological images as listed and agreed with the findings in the report. CT CHEST ABDOMEN PELVIS W CONTRAST Addendum Date: 11/03/2023 ADDENDUM REPORT: 11/03/2023 16:50 ADDENDUM: Synovial or paralabral cyst seen about the left shoulder anteriorly. Electronically Signed   By: Adrianna Horde M.D.   On: 11/03/2023 16:50   Result Date: 11/03/2023 CLINICAL DATA:  Cistern response for colon cancer. Prior left-sided colostomy. Chemotherapy. EXAM: CT CHEST, ABDOMEN, AND PELVIS WITH CONTRAST TECHNIQUE: Multidetector CT imaging of the chest, abdomen and pelvis was performed following the standard protocol during bolus administration of intravenous contrast. RADIATION DOSE REDUCTION: This exam was performed according to the departmental dose-optimization program which includes automated exposure control, adjustment of the mA and/or kV according to patient size and/or use of iterative reconstruction technique. CONTRAST:  OMNIPAQUE  IOHEXOL  300 MG/ML  SOLN COMPARISON:  05/14/2023 and older FINDINGS: CT CHEST FINDINGS Cardiovascular: Right IJ chest port. Tip of the catheter extends to the SVC right atrial junction. Heart is nonenlarged. Trace pericardial fluid is similar to previous. Coronary artery calcifications are seen. The thoracic aorta is normal course and caliber with  slight atherosclerotic calcified plaque. There is a bovine type aortic arch, normal variant. Slight enlargement of the main pulmonary artery. Please correlate for any evidence of pulmonary artery hypertension. Mediastinum/Nodes: Slight wall thickening along the esophagus diffusely, nonspecific. Small thyroid gland. No specific abnormal lymph node enlargement identified in the axillary regions, hilum or mediastinum. Lungs/Pleura: There is some linear opacity seen along bases likely scar or atelectasis. No consolidation, pneumothorax or effusion. Small area of pleural calcification along the anterior right hemithorax seen on series 4, image 68. Few other pleural calcifications are seen as well. 2-3 mm nodule along the lingula laterally juxtapleural series 4, image 79 is also stable. These nodules has been present since 2011 demonstrating long-term stability. Similar focus left lower lobe measuring 4 mm on image 71 of series 4 also with long-term stability since 2011. Musculoskeletal: Degenerative changes seen along the spine. Streak artifact related to patient's right shoulder arthroplasty. CT ABDOMEN PELVIS FINDINGS Hepatobiliary: Gallbladder full of stones. No space-occupying liver lesion. Preserved parenchyma. Patent portal vein. Pancreas: Unremarkable. No pancreatic ductal dilatation or surrounding inflammatory changes. Spleen: Normal in size without focal abnormality. Adrenals/Urinary Tract: Stable slight thickening of the lateral limb of the right adrenal gland. Minimal thickening on the left is also stable. Mild bilateral renal atrophy. Small left-sided renal cysts are identified including lower pole focus measuring 2.6 cm in diameter. Hounsfield unit of 3. Stable smaller cysts as well in the upper pole. 6 mm nonobstructing midportion left-sided renal stone. No ureteral stones. However there is a stellate calcification along left side of the urinary bladder posteriorly once again measuring proximally 11 mm.  This is near the left UVJ. Separate punctate stone more caudal. Preserved contour to the urinary bladder otherwise. Stomach/Bowel: Contrast and debris in the distended stomach. Small bowel is nondilated with surgical changes. Left-sided colostomy with some redundant loops of colon along  the opening the ostomy site, small parastomal hernia. Few colonic diverticula. The large bowel is nondilated. Appendiceal stump identified the right lower quadrant. In the surgical bed of the resection of the rectosigmoid colon once again in the presacral space is a oblong fluid collection with wall thickening and some adjacent stranding. Collection is similar to previous. Diameter including the wall thickening is 5.2 x 4.8 cm on image 106 and previously 4.9 x 4.7 cm, relatively similar. No associated gas or air. Vascular/Lymphatic: Aortic atherosclerosis. No enlarged abdominal or pelvic lymph nodes. Reproductive: Prostate is unremarkable. Other: Small fat containing inguinal hernias. No free intra-air or free fluid. Surgical changes along the anterior abdominal wall in the midline. Musculoskeletal: Diffuse degenerative changes of the spine and pelvis. Bridging osteophytes identified. Transitional lumbosacral segment. IMPRESSION: No significant interval change. Stable surgical changes of APR with could diverting colostomy. Stable fluid collection in thickening in the surgical bed. No developing new mass lesion, fluid collection or lymph node enlargement in the chest, abdomen or pelvis. Gallstones.  Left-sided nonobstructing renal stone.  Bladder stones. Once again areas of calcifications along the pleura on the right side. Few scattered tiny lung nodules are similar to previous. Some of these have been present since 2011. Enlarged main pulmonary artery. Electronically Signed: By: Adrianna Horde M.D. On: 11/03/2023 16:46

## 2023-11-25 NOTE — Progress Notes (Signed)
 Patient is doing ok, he had a Colonoscopy back on 11/11/2023, and they still have not heard any results, so I told them I would try to call and get them to result it so they can see it in their MyChart. No new questions or concerns for the doctor today besides wether or not they will have to come back to the cancer center after today.

## 2023-11-25 NOTE — Patient Instructions (Signed)

## 2023-11-26 ENCOUNTER — Other Ambulatory Visit: Payer: Self-pay

## 2023-11-26 LAB — CEA: CEA: 2.5 ng/mL (ref 0.0–4.7)

## 2023-12-11 DIAGNOSIS — Z933 Colostomy status: Secondary | ICD-10-CM | POA: Diagnosis not present

## 2024-01-06 ENCOUNTER — Other Ambulatory Visit: Payer: Self-pay | Admitting: *Deleted

## 2024-01-06 ENCOUNTER — Inpatient Hospital Stay: Attending: Internal Medicine

## 2024-01-06 DIAGNOSIS — Z452 Encounter for adjustment and management of vascular access device: Secondary | ICD-10-CM | POA: Diagnosis not present

## 2024-01-06 DIAGNOSIS — C184 Malignant neoplasm of transverse colon: Secondary | ICD-10-CM | POA: Diagnosis not present

## 2024-01-06 DIAGNOSIS — Z95828 Presence of other vascular implants and grafts: Secondary | ICD-10-CM

## 2024-01-06 MED ORDER — HEPARIN SOD (PORK) LOCK FLUSH 100 UNIT/ML IV SOLN
500.0000 [IU] | Freq: Once | INTRAVENOUS | Status: AC
  Administered 2024-01-06: 500 [IU] via INTRAVENOUS
  Filled 2024-01-06: qty 5

## 2024-01-06 MED ORDER — GABAPENTIN 100 MG PO CAPS: 200.0000 mg | ORAL_CAPSULE | Freq: Three times a day (TID) | ORAL | 0 refills | Status: DC

## 2024-01-12 DIAGNOSIS — Z933 Colostomy status: Secondary | ICD-10-CM | POA: Diagnosis not present

## 2024-02-16 ENCOUNTER — Other Ambulatory Visit: Payer: Self-pay | Admitting: Internal Medicine

## 2024-02-25 ENCOUNTER — Inpatient Hospital Stay (HOSPITAL_BASED_OUTPATIENT_CLINIC_OR_DEPARTMENT_OTHER): Admitting: Internal Medicine

## 2024-02-25 ENCOUNTER — Inpatient Hospital Stay: Attending: Internal Medicine

## 2024-02-25 ENCOUNTER — Encounter: Payer: Self-pay | Admitting: Internal Medicine

## 2024-02-25 DIAGNOSIS — C184 Malignant neoplasm of transverse colon: Secondary | ICD-10-CM

## 2024-02-25 DIAGNOSIS — Z9221 Personal history of antineoplastic chemotherapy: Secondary | ICD-10-CM | POA: Diagnosis not present

## 2024-02-25 DIAGNOSIS — G620 Drug-induced polyneuropathy: Secondary | ICD-10-CM | POA: Diagnosis not present

## 2024-02-25 DIAGNOSIS — Z923 Personal history of irradiation: Secondary | ICD-10-CM | POA: Insufficient documentation

## 2024-02-25 DIAGNOSIS — M1990 Unspecified osteoarthritis, unspecified site: Secondary | ICD-10-CM | POA: Insufficient documentation

## 2024-02-25 DIAGNOSIS — Z79899 Other long term (current) drug therapy: Secondary | ICD-10-CM | POA: Diagnosis not present

## 2024-02-25 DIAGNOSIS — Z85038 Personal history of other malignant neoplasm of large intestine: Secondary | ICD-10-CM | POA: Insufficient documentation

## 2024-02-25 DIAGNOSIS — T451X5A Adverse effect of antineoplastic and immunosuppressive drugs, initial encounter: Secondary | ICD-10-CM | POA: Diagnosis not present

## 2024-02-25 LAB — CMP (CANCER CENTER ONLY)
ALT: 20 U/L (ref 0–44)
AST: 29 U/L (ref 15–41)
Albumin: 4 g/dL (ref 3.5–5.0)
Alkaline Phosphatase: 89 U/L (ref 38–126)
Anion gap: 7 (ref 5–15)
BUN: 20 mg/dL (ref 8–23)
CO2: 23 mmol/L (ref 22–32)
Calcium: 9.1 mg/dL (ref 8.9–10.3)
Chloride: 107 mmol/L (ref 98–111)
Creatinine: 1.02 mg/dL (ref 0.61–1.24)
GFR, Estimated: >60 mL/min (ref >60)
Glucose, Bld: 116 mg/dL — ABNORMAL HIGH (ref 70–99)
Potassium: 3.9 mmol/L (ref 3.5–5.1)
Sodium: 137 mmol/L (ref 135–145)
Total Bilirubin: 0.7 mg/dL (ref 0.0–1.2)
Total Protein: 7.1 g/dL (ref 6.5–8.1)

## 2024-02-25 LAB — CBC WITH DIFFERENTIAL (CANCER CENTER ONLY)
Abs Immature Granulocytes: 0.03 K/uL (ref 0.00–0.07)
Basophils Absolute: 0.1 K/uL (ref 0.0–0.1)
Basophils Relative: 2 %
Eosinophils Absolute: 0.7 K/uL — ABNORMAL HIGH (ref 0.0–0.5)
Eosinophils Relative: 7 %
HCT: 39.6 % (ref 39.0–52.0)
Hemoglobin: 14 g/dL (ref 13.0–17.0)
Immature Granulocytes: 0 %
Lymphocytes Relative: 29 %
Lymphs Abs: 2.8 K/uL (ref 0.7–4.0)
MCH: 32.4 pg (ref 26.0–34.0)
MCHC: 35.4 g/dL (ref 30.0–36.0)
MCV: 91.7 fL (ref 80.0–100.0)
Monocytes Absolute: 0.6 K/uL (ref 0.1–1.0)
Monocytes Relative: 7 %
Neutro Abs: 5.2 K/uL (ref 1.7–7.7)
Neutrophils Relative %: 55 %
Platelet Count: 180 K/uL (ref 150–400)
RBC: 4.32 MIL/uL (ref 4.22–5.81)
RDW: 12.7 % (ref 11.5–15.5)
WBC Count: 9.4 K/uL (ref 4.0–10.5)
nRBC: 0 % (ref 0.0–0.2)

## 2024-02-25 MED ORDER — LIDOCAINE-PRILOCAINE 2.5-2.5 % EX CREA: TOPICAL_CREAM | CUTANEOUS | 3 refills | Status: DC

## 2024-02-25 NOTE — Assessment & Plan Note (Addendum)
#  Transverse colon adenocarcinoma, Stage IIIC ([id number]), MMR proficient- s/p FOLFOX Oxaliplatin  permanently discontinued with cycle 8.  Completed 12 cycles of 5-FU on 01/27/2023.   #  CT chest abdomen pelvis reviewed from April 2025 - NED. Stable pulmonary nodules since 2011, stable surgical changes of APR with fluid collection. will  Repeat CT imaging due in October 2025. Ordered today.    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  Stable. .   # Arthritis: -Patient has multiple arthritic joints including shoulders or hips; -On oxycodone  5 mg once a day Stable. SABRA  #History of colon cancer s/p left sided colostomy, chemo in 2011 -Surgery and had adjuvant chemotherapy.    # # Genetics counseling: I at length discussed with the patient that majority of cancers are sporadic however 10 to 20% at risk of genetic/hereditary cancer syndromes.  Discussed importance of genetic counseling/genetic testing-given the therapeutic/preventative aspects.  Patient wants to decide on genetic counseling.  Await referral to genetic counselling.  IHC Interpretation: No loss of nuclear expression of MMR proteins: Low probability of MSI-H.   # Access -port placed on 08/08/2022 by Dr. Tye.  Port maintenance with flushes every 6 weeks.  # DISPOSITION: # Port maintenance with flushes every 6 weeks # follow up in 3 months- MD: labs- cbc/cmp; CEA- CT CAP prior-Dr.B

## 2024-02-25 NOTE — Progress Notes (Signed)
 [facility] CONSULT NOTE  Patient Care Team: Glover Lenis, MD as PCP - General (Family Medicine) Maurie Rayfield BIRCH, RN as Oncology Nurse Navigator Tye Millet, DO as Consulting Physician (General Surgery) Rennie Cindy SAUNDERS, MD as Consulting Physician (Oncology)  CHIEF COMPLAINTS/PURPOSE OF CONSULTATION: Colon cancer.  Oncology History Overview Note  Patient has a history of colon cancer in 2011 treated with Dr. Michaela with radiation followed by surgery s/p left sided colostomy and adjuvant chemo likely FOLFOX. Records unavailable.   #Transverse colon adenocarcinoma, Stage IIIC ([id number]), MMR proficient- [presented with bowel obstruction. S/p resection and reanastomosis with Dr. Tye on 07/03/22]- .    -Oxaliplatin  held with cycle 4 due to worsening cold induced neuropathy.  Neuropathy has improved -> resumed oxaliplatin  50 mg/m with cycle 6.  Oxaliplatin  permanently discontinued with cycle 8.  Completed 12 cycles of 5-FU on 01/27/2023.   - CT chest abdomen pelvis reviewed from April 2025 - NED. Stable pulmonary nodules since 2011, stable surgical changes of APR with fluid collection.  Left-sided nonobstructing renal stones.  Pleural calcifications on the right side.     Cancer of transverse colon (HCC)  06/29/2022 Initial Diagnosis   Presented to ED for abdominal pain, nausea and vomiting.    Imaging   CT abdo pelvis IMPRESSION: 1. Patient is status post abdominoperineal resection with left lower quadrant ostomy. The ascending and transverse colon are moderately distended and there is abrupt caliber change at the distal transverse colon/splenic flexure where there is irregular masslike thickening. Findings are suspicious for colonic obstruction due to a mass at this location. There is no dilated small bowel. 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 2. Slightly thick-walled presacral fluid  collection with peripheral calcification, may reflect chronic postoperative collection. 3. Gallstones. 4. Aortic atherosclerosis.  CT chest IMPRESSION: 1. No acute intrathoracic process. No evidence of intrathoracic metastases.    Procedure   Colonoscopy on 06/29/22 by Dr. Tye A fungating partially obstructing mass in mid transverse colon.   07/03/2022 Pathology Results   DIAGNOSIS: A. 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 - SEE CANCER SUMMARY BELOW. - TATTOO INK. - SEPARATE 4.5 CM LENGTH OF SMALL INTESTINE.  CANCER CASE SUMMARY: COLON AND RECTUM Standard(s): AJCC-UICC 8  SPECIMEN Procedure: Transverse colectomy  TUMOR Tumor Site: Transverse colon Histologic Type: Adenocarcinoma Histologic Grade: G2, moderately differentiated Tumor Size: Greatest dimension: 6.1 cm Tumor Extent: Directly invades or adheres to adjacent structures (small intestine) Macroscopic Tumor Perforation: Not identified Lymphatic and/or Vascular Invasion: Large vessel (venous), extramural Perineural Invasion: Present Tumor Budding Score: High (10 or more) Treatment Effect: No known presurgical therapy  MARGINS Margin Status for Invasive Carcinoma: All margins negative for invasive carcinoma      Margins examined: Proximal, distal, and mesenteric Margin Status for Non-Invasive Tumor: All margins negative for high-grade dysplasia/intramucosal carcinoma and low-grade dysplasia  REGIONAL LYMPH NODES Regional Lymph Nodes: Regional lymph nodes present      Tumor present in regional lymph node(s)           Number of Lymph Nodes with Tumor: 1      Number of Lymph Nodes Examined: 24  Tumor Deposits: Not identified  DISTANT METASTASES Distant Site(s) Involved, if applicable: Not applicable  PATHOLOGIC STAGE CLASSIFICATION (pTNM, AJCC 8th Edition): Modified Classification: Not applicable pT4b      T suffix: Not  applicable pN1a pM not applicable - pM cannot be determined from the  submitted specimen(s)    07/03/2022 Procedure   Exploratory laparotomy, en bloc colon resection, reanastomosis, primary repair of parastomal hernia by Dr. Tye     07/12/2022 Cancer Staging   Staging form: Colon and Rectum, AJCC 8th Edition - Clinical: Stage IIIC (cT4b, cN1a, cM0) - Signed by Agrawal, Kavita, MD on 07/12/2022 Stage prefix: Initial diagnosis Total positive nodes: 1 Total nodes examined: 24   08/12/2022 -  Chemotherapy   Patient is on Treatment Plan : COLORECTAL FOLFOX q14d x 6 months       HISTORY OF PRESENTING ILLNESS: Patient ambulating-independently. Accompanied by wife  [name] 73 y.o.  male pleasant patient with history of colon cancer x 2-most recent transverse colon status post surgery status post adjuvant chemotherapy is here for follow-up.  Patient admits to mild tingling and numbness extremities.  Not any worse.  No falls.  No nausea vomiting.  No abdominal pain.  No fever no chills.    Review of Systems  Constitutional:  Negative for chills, diaphoresis, fever, malaise/fatigue and weight loss.  HENT:  Negative for nosebleeds and sore throat.   Eyes:  Negative for double vision.  Respiratory:  Negative for cough, hemoptysis, sputum production, shortness of breath and wheezing.   Cardiovascular:  Negative for chest pain, palpitations, orthopnea and leg swelling.  Gastrointestinal:  Negative for abdominal pain, blood in stool, constipation, diarrhea, heartburn, melena, nausea and vomiting.  Genitourinary:  Negative for dysuria, frequency and urgency.  Musculoskeletal:  Positive for back pain and joint pain.  Skin: Negative.  Negative for itching and rash.  Neurological:  Negative for dizziness, tingling, focal weakness, weakness and headaches.  Endo/Heme/Allergies:  Does not bruise/bleed easily.  Psychiatric/Behavioral:  Negative for depression. The patient is not  nervous/anxious and does not have insomnia.     MEDICAL HISTORY:  Past Medical History:  Diagnosis Date   Colon cancer (HCC)    remission 2011   Heart murmur    as a child from rheumatic fever   Malignant neoplasm of transverse colon (HCC) 2024   Microcytic anemia    Neuropathy    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HCC)    Protein-calorie malnutrition, severe (HCC)     SURGICAL HISTORY: Past Surgical History:  Procedure Laterality Date   APPENDECTOMY     COLECTOMY WITH COLOSTOMY CREATION/HARTMANN PROCEDURE N/A 07/03/2022   Procedure: COLECTOMY WITH COLOSTOMY CREATION/HARTMANN PROCEDURE;  Surgeon: Tye Millet, DO;  Location: [facility] ORS;  Service: General;  Laterality: N/A;   COLONOSCOPY N/A 06/29/2022   Procedure: COLONOSCOPY;  Surgeon: Tye Millet, DO;  Location: [facility] END[facility]COPY;  Service: General;  Laterality: N/A;   COLONOSCOPY WITH PROPOFOL  N/A 10/01/2023   Procedure: COLONOSCOPY WITH PROPOFOL ;  Surgeon: Tye Millet, DO;  Location: [facility] ENDOSCOPY;  Service: General;  Laterality: N/A;   PORTACATH PLACEMENT Right 08/08/2022   Procedure: INSERTION PORT-A-CATH;  Surgeon: Tye Millet, DO;  Location: [facility] ORS;  Service: General;  Laterality: Right;    SOCIAL HISTORY: Social History   Socioeconomic History   Marital status: Single    Spouse name: Not on file   Number of children: Not on file   Years of education: Not on file   Highest education level: Not on file  Occupational History   Not on file  Tobacco Use   Smoking status: Never   Smokeless tobacco: Never  Vaping Use   Vaping status: Never Used  Substance and Sexual Activity   Alcohol use: Not Currently   Drug use: Never   Sexual activity: Not  on file  Other Topics Concern   Not on file  Social History Narrative   Not on file   Social Drivers of Health   Financial Resource Strain: Not on file  Food Insecurity: Unknown (06/29/2022)   Hunger Vital Sign    Worried About Running Out of Food in the Last Year: Never true     Ran Out of Food in the Last Year: Not on file  Transportation Needs: No Transportation Needs (06/29/2022)   PRAPARE - Administrator, Civil Service (Medical): No    Lack of Transportation (Non-Medical): No  Physical Activity: Not on file  Stress: Not on file  Social Connections: Not on file  Intimate Partner Violence: Not At Risk (06/29/2022)   Humiliation, Afraid, Rape, and Kick questionnaire    Fear of Current or Ex-Partner: No    Emotionally Abused: No    Physically Abused: No    Sexually Abused: No    FAMILY HISTORY: History reviewed. No pertinent family history.  ALLERGIES:  has no known allergies.  MEDICATIONS:  Current Outpatient Medications  Medication Sig Dispense Refill   Docusate Sodium  (DSS) 100 MG CAPS Take 1 capsule by mouth 2 (two) times daily. As needed for mild constipation.     Ferrous Sulfate (IRON  PO) Take 1 tablet by mouth 3 (three) times a week.     gabapentin  (NEURONTIN ) 100 MG capsule TAKE 2 CAPSULES BY MOUTH 3 TIMES DAILY. 180 capsule 0   ibuprofen  (ADVIL ) 800 MG tablet 800 mg.     Multiple Vitamin (MULTIVITAMIN) tablet Take 1 tablet by mouth daily.     lidocaine -prilocaine  (EMLA ) cream Apply to affected area once 30 g 3   ondansetron  (ZOFRAN ) 4 MG tablet Take 4 mg by mouth every 8 (eight) hours as needed for nausea or vomiting. (Patient not taking: Reported on 02/25/2024)     No current facility-administered medications for this visit.   Facility-Administered Medications Ordered in Other Visits  Medication Dose Route Frequency Provider Last Rate Last Admin   heparin  lock flush 100 unit/mL  500 Units Intravenous Once Agrawal, Kavita, MD       sodium chloride  flush (NS) 0.9 % injection 10 mL  10 mL Intracatheter PRN Agrawal, Kavita, MD   10 mL at 01/29/23 1321    PHYSICAL EXAMINATION:   Vitals:   02/25/24 1021 02/25/24 1057  BP: (!) 159/96 (!) 177/90  Pulse: 62   Resp: 18   Temp: (!) 96.9 F (36.1 C)   SpO2: 99%    Filed Weights    02/25/24 1021  Weight: 189 lb (85.7 kg)    Physical Exam Vitals and nursing note reviewed.  HENT:     Head: Normocephalic and atraumatic.     Mouth/Throat:     Pharynx: Oropharynx is clear.  Eyes:     Extraocular Movements: Extraocular movements intact.     Pupils: Pupils are equal, round, and reactive to light.  Cardiovascular:     Rate and Rhythm: Normal rate and regular rhythm.  Pulmonary:     Comments: Decreased breath sounds bilaterally.  Abdominal:     Palpations: Abdomen is soft.  Musculoskeletal:        General: Normal range of motion.     Cervical back: Normal range of motion.  Skin:    General: Skin is warm.  Neurological:     General: No focal deficit present.     Mental Status: He is alert and oriented to person, place, and time.  Psychiatric:  Behavior: Behavior normal.        Judgment: Judgment normal.     LABORATORY DATA:  I have reviewed the data as listed Lab Results  Component Value Date   WBC 9.4 02/25/2024   HGB 14.0 02/25/2024   HCT 39.6 02/25/2024   MCV 91.7 02/25/2024   PLT 180 02/25/2024   Recent Labs    08/26/23 1040 11/25/23 1033 02/25/24 1030  NA 136 138 137  K 4.0 4.1 3.9  CL 104 107 107  CO2 23 22 23   GLUCOSE 109* 96 116*  BUN 20 19 20   CREATININE 1.09 1.09 1.02  CALCIUM  9.1 8.7* 9.1  GFRNONAA >60 >60 >60  PROT 7.2 7.2 7.1  ALBUMIN 4.2 4.1 4.0  AST 25 25 29   ALT 21 23 20   ALKPHOS 86 106 89  BILITOT 0.7 0.4 0.7    RADIOGRAPHIC STUDIES: I have personally reviewed the radiological images as listed and agreed with the findings in the report. No results found.   Cancer of transverse colon (HCC) #Transverse colon adenocarcinoma, Stage IIIC ([id number]), MMR proficient- s/p FOLFOX Oxaliplatin  permanently discontinued with cycle 8.  Completed 12 cycles of 5-FU on 01/27/2023.   #  CT chest abdomen pelvis reviewed from April 2025 - NED. Stable pulmonary nodules since 2011, stable surgical changes of APR with fluid  collection. will  Repeat CT imaging due in October 2025. Ordered today.    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  Stable. .   # Arthritis: -Patient has multiple arthritic joints including shoulders or hips; -On oxycodone  5 mg once a day Stable. SABRA  #History of colon cancer s/p left sided colostomy, chemo in 2011 -Surgery and had adjuvant chemotherapy.    # # Genetics counseling: I at length discussed with the patient that majority of cancers are sporadic however 10 to 20% at risk of genetic/hereditary cancer syndromes.  Discussed importance of genetic counseling/genetic testing-given the therapeutic/preventative aspects.  Patient wants to decide on genetic counseling.  Await referral to genetic counselling.  IHC Interpretation: No loss of nuclear expression of MMR proteins: Low probability of MSI-H.   # Access -port placed on 08/08/2022 by Dr. Tye.  Port maintenance with flushes every 6 weeks.  # DISPOSITION: # Port maintenance with flushes every 6 weeks # follow up in 3 months- MD: labs- cbc/cmp; CEA- CT CAP prior-Dr.B   Above plan of care was discussed with patient/family in detail.  My contact information was given to the patient/family.       Cindy JONELLE Joe, MD 02/25/2024 1:44 PM

## 2024-02-25 NOTE — Progress Notes (Signed)
 Needs refill of emla , pended.

## 2024-02-26 ENCOUNTER — Other Ambulatory Visit: Payer: Self-pay

## 2024-02-26 ENCOUNTER — Other Ambulatory Visit: Payer: Self-pay | Admitting: *Deleted

## 2024-02-26 LAB — CEA: CEA: 2.4 ng/mL (ref 0.0–4.7)

## 2024-02-26 MED ORDER — LIDOCAINE-PRILOCAINE 2.5-2.5 % EX CREA: TOPICAL_CREAM | CUTANEOUS | 2 refills | Status: AC

## 2024-03-09 DIAGNOSIS — Z933 Colostomy status: Secondary | ICD-10-CM | POA: Diagnosis not present

## 2024-03-25 ENCOUNTER — Other Ambulatory Visit: Payer: Self-pay | Admitting: Internal Medicine

## 2024-04-07 ENCOUNTER — Inpatient Hospital Stay: Attending: Internal Medicine

## 2024-04-16 ENCOUNTER — Other Ambulatory Visit: Payer: Self-pay

## 2024-05-03 ENCOUNTER — Other Ambulatory Visit: Payer: Self-pay | Admitting: Internal Medicine

## 2024-05-27 ENCOUNTER — Ambulatory Visit
Admission: RE | Admit: 2024-05-27 | Discharge: 2024-05-27 | Disposition: A | Source: Ambulatory Visit | Attending: Internal Medicine

## 2024-05-27 DIAGNOSIS — C184 Malignant neoplasm of transverse colon: Secondary | ICD-10-CM

## 2024-05-27 DIAGNOSIS — N200 Calculus of kidney: Secondary | ICD-10-CM | POA: Diagnosis not present

## 2024-05-27 DIAGNOSIS — K8020 Calculus of gallbladder without cholecystitis without obstruction: Secondary | ICD-10-CM | POA: Diagnosis not present

## 2024-05-27 DIAGNOSIS — R911 Solitary pulmonary nodule: Secondary | ICD-10-CM | POA: Diagnosis not present

## 2024-05-27 MED ORDER — SODIUM CHLORIDE 0.9% FLUSH
10.0000 mL | INTRAVENOUS | Status: DC | PRN
  Administered 2024-05-27: 10 mL via INTRAVENOUS

## 2024-05-27 MED ORDER — IOPAMIDOL (ISOVUE-300) INJECTION 61%
100.0000 mL | Freq: Once | INTRAVENOUS | Status: AC | PRN
  Administered 2024-05-27: 100 mL via INTRAVENOUS

## 2024-05-27 MED ORDER — HEPARIN SOD (PORK) LOCK FLUSH 100 UNIT/ML IV SOLN
500.0000 [IU] | Freq: Once | INTRAVENOUS | Status: AC
  Administered 2024-05-27: 500 [IU] via INTRAVENOUS

## 2024-05-28 DIAGNOSIS — Z933 Colostomy status: Secondary | ICD-10-CM | POA: Diagnosis not present

## 2024-06-07 ENCOUNTER — Other Ambulatory Visit: Payer: Self-pay | Admitting: Nurse Practitioner

## 2024-06-08 ENCOUNTER — Inpatient Hospital Stay

## 2024-06-08 ENCOUNTER — Telehealth: Payer: Self-pay | Admitting: Internal Medicine

## 2024-06-08 ENCOUNTER — Inpatient Hospital Stay: Attending: Internal Medicine

## 2024-06-08 ENCOUNTER — Encounter: Payer: Self-pay | Admitting: Internal Medicine

## 2024-06-08 ENCOUNTER — Inpatient Hospital Stay (HOSPITAL_BASED_OUTPATIENT_CLINIC_OR_DEPARTMENT_OTHER): Admitting: Internal Medicine

## 2024-06-08 VITALS — BP 176/101 | HR 65 | Temp 97.5°F | Resp 18 | Ht 68.5 in | Wt 186.7 lb

## 2024-06-08 DIAGNOSIS — Z9049 Acquired absence of other specified parts of digestive tract: Secondary | ICD-10-CM | POA: Insufficient documentation

## 2024-06-08 DIAGNOSIS — G620 Drug-induced polyneuropathy: Secondary | ICD-10-CM

## 2024-06-08 DIAGNOSIS — Z79899 Other long term (current) drug therapy: Secondary | ICD-10-CM | POA: Diagnosis not present

## 2024-06-08 DIAGNOSIS — C184 Malignant neoplasm of transverse colon: Secondary | ICD-10-CM | POA: Insufficient documentation

## 2024-06-08 DIAGNOSIS — T451X5A Adverse effect of antineoplastic and immunosuppressive drugs, initial encounter: Secondary | ICD-10-CM

## 2024-06-08 DIAGNOSIS — Z85038 Personal history of other malignant neoplasm of large intestine: Secondary | ICD-10-CM | POA: Diagnosis not present

## 2024-06-08 LAB — CBC WITH DIFFERENTIAL (CANCER CENTER ONLY)
Abs Immature Granulocytes: 0.04 K/uL (ref 0.00–0.07)
Basophils Absolute: 0.1 K/uL (ref 0.0–0.1)
Basophils Relative: 1 %
Eosinophils Absolute: 0.3 K/uL (ref 0.0–0.5)
Eosinophils Relative: 4 %
HCT: 41.6 % (ref 39.0–52.0)
Hemoglobin: 14.7 g/dL (ref 13.0–17.0)
Immature Granulocytes: 1 %
Lymphocytes Relative: 30 %
Lymphs Abs: 2.6 K/uL (ref 0.7–4.0)
MCH: 32.2 pg (ref 26.0–34.0)
MCHC: 35.3 g/dL (ref 30.0–36.0)
MCV: 91 fL (ref 80.0–100.0)
Monocytes Absolute: 0.6 K/uL (ref 0.1–1.0)
Monocytes Relative: 7 %
Neutro Abs: 5.1 K/uL (ref 1.7–7.7)
Neutrophils Relative %: 57 %
Platelet Count: 184 K/uL (ref 150–400)
RBC: 4.57 MIL/uL (ref 4.22–5.81)
RDW: 12.4 % (ref 11.5–15.5)
WBC Count: 8.9 K/uL (ref 4.0–10.5)
nRBC: 0 % (ref 0.0–0.2)

## 2024-06-08 LAB — CMP (CANCER CENTER ONLY)
ALT: 21 U/L (ref 0–44)
AST: 25 U/L (ref 15–41)
Albumin: 4 g/dL (ref 3.5–5.0)
Alkaline Phosphatase: 97 U/L (ref 38–126)
Anion gap: 9 (ref 5–15)
BUN: 22 mg/dL (ref 8–23)
CO2: 22 mmol/L (ref 22–32)
Calcium: 8.9 mg/dL (ref 8.9–10.3)
Chloride: 105 mmol/L (ref 98–111)
Creatinine: 1.02 mg/dL (ref 0.61–1.24)
GFR, Estimated: >60 mL/min (ref >60)
Glucose, Bld: 118 mg/dL — ABNORMAL HIGH (ref 70–99)
Potassium: 4.1 mmol/L (ref 3.5–5.1)
Sodium: 136 mmol/L (ref 135–145)
Total Bilirubin: 0.7 mg/dL (ref 0.0–1.2)
Total Protein: 7.2 g/dL (ref 6.5–8.1)

## 2024-06-08 LAB — GENETIC SCREENING ORDER

## 2024-06-08 MED ORDER — GABAPENTIN 300 MG PO CAPS: 300.0000 mg | ORAL_CAPSULE | Freq: Two times a day (BID) | ORAL | 1 refills | Status: AC

## 2024-06-08 MED ORDER — AMLODIPINE BESYLATE 5 MG PO TABS: 5.0000 mg | ORAL_TABLET | Freq: Every day | ORAL | 1 refills | Status: DC

## 2024-06-08 NOTE — Progress Notes (Signed)
 CT CAP 05/27/24.  Needs refill on gabapentin , pended.

## 2024-06-08 NOTE — Assessment & Plan Note (Addendum)
#  Transverse colon adenocarcinoma, Stage IIIC ([id number]), MMR proficient- s/p FOLFOX Oxaliplatin  permanently discontinued with cycle 8.  Completed 12 cycles of 5-FU on 01/27/2023.  #  CT chest abdomen pelvis reviewed  NOV 11th, 2025- Status post abdominoperineal resection with left lower quadrant descending end colostomy. Unchanged fluid collection in the resection bed measuring 4.4 x 4.0 cm. . No evidence of lymphadenopathy or metastatic disease in the chest, abdomen, or pelvis.  Discussed with surgery-no evidence of any concerns of progressive disease.  Recent tumor markers negative.  Discussed regarding CT DNA/molecular surveillance.  Patient interested.  Also discussed with Kristi Stanton.  Patient had procedural issues with colonoscopy with Dr. Tye.  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  Stable.  Refilled.  #History of colon cancer s/p left sided colostomy, chemo in 2011 -Surgery and had adjuvant chemotherapy.    # # BP poorly controlled- Please check your blood pressure at home; if still elevated-reach out to PCP for further instructions.  Bring the log of blood pressures at next visit for our review.start pt on Norvsc 5 mg/day- then follow up with PCP.   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 [s/p evaluation] cardiology in the past.  Defer to PCP  # Access -port placed on 08/08/2022 by Dr. Tye.  Port maintenance with flushes every 6 weeks.  #Incidental findings on Imaging  CT , 2025: Cholelithiasis. Nonobstructive left nephrolithiasis; Jackstone calculus in the right aspect of the bladder measuring 1.3 cm as well as additional punctuate bladder calculi. Coronary artery disease I reviewed/discussed/counseled the patient.   # DISPOSITION: # repeat BP  # Port maintenance  with flushes every 2 month x2  # follow up in 6  months- MD: labs- cbc/cmp; CEA- CT CAP prior-Dr.B

## 2024-06-08 NOTE — Progress Notes (Signed)
 [facility] CONSULT NOTE  Patient Care Team: Glover Lenis, MD as PCP - General (Family Medicine) Maurie Rayfield BIRCH, RN as Oncology Nurse Navigator Tye Millet, DO as Consulting Physician (General Surgery) Rennie Cindy SAUNDERS, MD as Consulting Physician (Oncology)  CHIEF COMPLAINTS/PURPOSE OF CONSULTATION: Colon cancer.  Oncology History Overview Note  Patient has a history of colon cancer in 2011 treated with Dr. Michaela with radiation followed by surgery s/p left sided colostomy and adjuvant chemo likely FOLFOX. Records unavailable.   #Transverse colon adenocarcinoma, Stage IIIC ([id number]), MMR proficient- [presented with bowel obstruction. S/p resection and reanastomosis with Dr. Tye on 07/03/22]- .    -Oxaliplatin  held with cycle 4 due to worsening cold induced neuropathy.  Neuropathy has improved -> resumed oxaliplatin  50 mg/m with cycle 6.  Oxaliplatin  permanently discontinued with cycle 8.  Completed 12 cycles of 5-FU on 01/27/2023.   - CT chest abdomen pelvis reviewed from April 2025 - NED. Stable pulmonary nodules since 2011, stable surgical changes of APR with fluid collection.  Left-sided nonobstructing renal stones.  Pleural calcifications on the right side.     Cancer of transverse colon (HCC)  06/29/2022 Initial Diagnosis   Presented to ED for abdominal pain, nausea and vomiting.    Imaging   CT abdo pelvis IMPRESSION: 1. Patient is status post abdominoperineal resection with left lower quadrant ostomy. The ascending and transverse colon are moderately distended and there is abrupt caliber change at the distal transverse colon/splenic flexure where there is irregular masslike thickening. Findings are suspicious for colonic obstruction due to a mass at this location. There is no dilated small bowel. 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 2. Slightly thick-walled presacral fluid  collection with peripheral calcification, may reflect chronic postoperative collection. 3. Gallstones. 4. Aortic atherosclerosis.  CT chest IMPRESSION: 1. No acute intrathoracic process. No evidence of intrathoracic metastases.    Procedure   Colonoscopy on 06/29/22 by Dr. Tye A fungating partially obstructing mass in mid transverse colon.   07/03/2022 Pathology Results   DIAGNOSIS: A. 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 - SEE CANCER SUMMARY BELOW. - TATTOO INK. - SEPARATE 4.5 CM LENGTH OF SMALL INTESTINE.  CANCER CASE SUMMARY: COLON AND RECTUM Standard(s): AJCC-UICC 8  SPECIMEN Procedure: Transverse colectomy  TUMOR Tumor Site: Transverse colon Histologic Type: Adenocarcinoma Histologic Grade: G2, moderately differentiated Tumor Size: Greatest dimension: 6.1 cm Tumor Extent: Directly invades or adheres to adjacent structures (small intestine) Macroscopic Tumor Perforation: Not identified Lymphatic and/or Vascular Invasion: Large vessel (venous), extramural Perineural Invasion: Present Tumor Budding Score: High (10 or more) Treatment Effect: No known presurgical therapy  MARGINS Margin Status for Invasive Carcinoma: All margins negative for invasive carcinoma      Margins examined: Proximal, distal, and mesenteric Margin Status for Non-Invasive Tumor: All margins negative for high-grade dysplasia/intramucosal carcinoma and low-grade dysplasia  REGIONAL LYMPH NODES Regional Lymph Nodes: Regional lymph nodes present      Tumor present in regional lymph node(s)           Number of Lymph Nodes with Tumor: 1      Number of Lymph Nodes Examined: 24  Tumor Deposits: Not identified  DISTANT METASTASES Distant Site(s) Involved, if applicable: Not applicable  PATHOLOGIC STAGE CLASSIFICATION (pTNM, AJCC 8th Edition): Modified Classification: Not applicable pT4b      T suffix: Not  applicable pN1a pM not applicable - pM cannot be determined from the  submitted specimen(s)    07/03/2022 Procedure   Exploratory laparotomy, en bloc colon resection, reanastomosis, primary repair of parastomal hernia by Dr. Tye     07/12/2022 Cancer Staging   Staging form: Colon and Rectum, AJCC 8th Edition - Clinical: Stage IIIC (cT4b, cN1a, cM0) - Signed by Agrawal, Kavita, MD on 07/12/2022 Stage prefix: Initial diagnosis Total positive nodes: 1 Total nodes examined: 24   08/12/2022 -  Chemotherapy   Patient is on Treatment Plan : COLORECTAL FOLFOX q14d x 6 months       HISTORY OF PRESENTING ILLNESS: Patient ambulating-independently. Accompanied by wife  [name] 73 y.o.  male pleasant patient with history of colon cancer x 2-most recent transverse colon status post surgery status post adjuvant chemotherapy is here for follow-up/and review results of the CT scan.  Discussed the use of AI scribe software for clinical note transcription with the patient, who gave verbal consent to proceed.  History of Present Illness   [name] is a 73 year old male with stage three colon cancer who presents for follow-up and review of CT scan results.  He experiences ongoing neuropathy, which he manages with gabapentin . Initially, he stopped the medication, thinking it was ineffective, but resumed after noticing improvement. He takes 600 mg daily, divided into two doses of 300 mg each, due to his daily schedule. No dizziness or drowsiness from gabapentin .  A recent CT scan showed a pocket of fluid in the area of previous surgery, a hernia, and a bladder stone, as discussed with the patient. He was unaware of the bladder stone and has not seen a urologist. The scan was noted to show an enlarged artery from the lungs, as discussed with the patient. No lung problems or COPD.  He experiences joint pain, which he attributes to his history of rheumatic fever. He describes  persistent pain in his back and joints since age [age].  His blood pressure readings have been high during visits, with a recent reading of 188/105. He does not monitor his blood pressure at home and has never taken blood pressure medication. He takes iron  supplements but questions their necessity.  He is retired, previously worked as an personnel officer, and is currently building a garage. He had a colonoscopy in March 2025 in [place] due to logistical issues with his usual provider.       Review of Systems  Constitutional:  Negative for chills, diaphoresis, fever, malaise/fatigue and weight loss.  HENT:  Negative for nosebleeds and sore throat.   Eyes:  Negative for double vision.  Respiratory:  Negative for cough, hemoptysis, sputum production, shortness of breath and wheezing.   Cardiovascular:  Negative for chest pain, palpitations, orthopnea and leg swelling.  Gastrointestinal:  Negative for abdominal pain, blood in stool, constipation, diarrhea, heartburn, melena, nausea and vomiting.  Genitourinary:  Negative for dysuria, frequency and urgency.  Musculoskeletal:  Positive for back pain and joint pain.  Skin: Negative.  Negative for itching and rash.  Neurological:  Negative for dizziness, tingling, focal weakness, weakness and headaches.  Endo/Heme/Allergies:  Does not bruise/bleed easily.  Psychiatric/Behavioral:  Negative for depression. The patient is not nervous/anxious and does not have insomnia.     MEDICAL HISTORY:  Past Medical History:  Diagnosis Date   Colon cancer (HCC)    remission 2011   Heart murmur    as a child from rheumatic fever   Malignant neoplasm of transverse colon (HCC) 2024   Microcytic anemia    Neuropathy  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HCC)    Protein-calorie malnutrition, severe     SURGICAL HISTORY: Past Surgical History:  Procedure Laterality Date   APPENDECTOMY     COLECTOMY WITH COLOSTOMY CREATION/HARTMANN PROCEDURE N/A 07/03/2022    Procedure: COLECTOMY WITH COLOSTOMY CREATION/HARTMANN PROCEDURE;  Surgeon: Tye Millet, DO;  Location: [facility] ORS;  Service: General;  Laterality: N/A;   COLONOSCOPY N/A 06/29/2022   Procedure: COLONOSCOPY;  Surgeon: Tye Millet, DO;  Location: [facility] ENDOSCOPY;  Service: General;  Laterality: N/A;   COLONOSCOPY WITH PROPOFOL  N/A 10/01/2023   Procedure: COLONOSCOPY WITH PROPOFOL ;  Surgeon: Tye Millet, DO;  Location: [facility] ENDOSCOPY;  Service: General;  Laterality: N/A;   PORTACATH PLACEMENT Right 08/08/2022   Procedure: INSERTION PORT-A-CATH;  Surgeon: Tye Millet, DO;  Location: [facility] ORS;  Service: General;  Laterality: Right;    SOCIAL HISTORY: Social History   Socioeconomic History   Marital status: Single    Spouse name: Not on file   Number of children: Not on file   Years of education: Not on file   Highest education level: Not on file  Occupational History   Not on file  Tobacco Use   Smoking status: Never   Smokeless tobacco: Never  Vaping Use   Vaping status: Never Used  Substance and Sexual Activity   Alcohol use: Not Currently   Drug use: Never   Sexual activity: Not on file  Other Topics Concern   Not on file  Social History Narrative   Not on file   Social Drivers of Health   Financial Resource Strain: Not on file  Food Insecurity: Unknown (06/29/2022)   Hunger Vital Sign    Worried About Running Out of Food in the Last Year: Never true    Ran Out of Food in the Last Year: Not on file  Transportation Needs: No Transportation Needs (06/29/2022)   PRAPARE - Administrator, Civil Service (Medical): No    Lack of Transportation (Non-Medical): No  Physical Activity: Not on file  Stress: Not on file  Social Connections: Not on file  Intimate Partner Violence: Not At Risk (06/29/2022)   Humiliation, Afraid, Rape, and Kick questionnaire    Fear of Current or Ex-Partner: No    Emotionally Abused: No    Physically Abused: No    Sexually Abused: No     FAMILY HISTORY: History reviewed. No pertinent family history.  ALLERGIES:  has no known allergies.  MEDICATIONS:  Current Outpatient Medications  Medication Sig Dispense Refill   amLODipine (NORVASC) 5 MG tablet Take 1 tablet (5 mg total) by mouth daily. 30 tablet 1   Docusate Sodium  (DSS) 100 MG CAPS Take 1 capsule by mouth 2 (two) times daily. As needed for mild constipation. (Patient taking differently: Take 1 capsule by mouth 2 (two) times daily as needed. As needed for mild constipation.)     Ferrous Sulfate (IRON  PO) Take 1 tablet by mouth 3 (three) times a week.     ibuprofen  (ADVIL ) 800 MG tablet 800 mg.     lidocaine -prilocaine  (EMLA ) cream Apply to port 1 hour prior to chemo infusion. 30 g 2   Multiple Vitamin (MULTIVITAMIN) tablet Take 1 tablet by mouth daily.     ondansetron  (ZOFRAN ) 4 MG tablet Take 4 mg by mouth every 8 (eight) hours as needed for nausea or vomiting.     gabapentin  (NEURONTIN ) 300 MG capsule Take 1 capsule (300 mg total) by mouth 2 (two) times daily. 180 capsule 1   No  current facility-administered medications for this visit.   Facility-Administered Medications Ordered in Other Visits  Medication Dose Route Frequency Provider Last Rate Last Admin   heparin  lock flush 100 unit/mL  500 Units Intravenous Once Agrawal, Kavita, MD       sodium chloride  flush (NS) 0.9 % injection 10 mL  10 mL Intracatheter PRN Agrawal, Kavita, MD   10 mL at 01/29/23 1321    PHYSICAL EXAMINATION:   Vitals:   06/08/24 1037 06/08/24 1116  BP: (!) 188/105 (!) 176/101  Pulse:    Resp:    Temp:    SpO2:     Filed Weights   06/08/24 1028  Weight: 186 lb 11.2 oz (84.7 kg)    Physical Exam Vitals and nursing note reviewed.  HENT:     Head: Normocephalic and atraumatic.     Mouth/Throat:     Pharynx: Oropharynx is clear.  Eyes:     Extraocular Movements: Extraocular movements intact.     Pupils: Pupils are equal, round, and reactive to light.  Cardiovascular:      Rate and Rhythm: Normal rate and regular rhythm.  Pulmonary:     Comments: Decreased breath sounds bilaterally.  Abdominal:     Palpations: Abdomen is soft.  Musculoskeletal:        General: Normal range of motion.     Cervical back: Normal range of motion.  Skin:    General: Skin is warm.  Neurological:     General: No focal deficit present.     Mental Status: He is alert and oriented to person, place, and time.  Psychiatric:        Behavior: Behavior normal.        Judgment: Judgment normal.     LABORATORY DATA:  I have reviewed the data as listed Lab Results  Component Value Date   WBC 8.9 06/08/2024   HGB 14.7 06/08/2024   HCT 41.6 06/08/2024   MCV 91.0 06/08/2024   PLT 184 06/08/2024   Recent Labs    11/25/23 1033 02/25/24 1030 06/08/24 1020  NA 138 137 136  K 4.1 3.9 4.1  CL 107 107 105  CO2 22 23 22   GLUCOSE 96 116* 118*  BUN 19 20 22   CREATININE 1.09 1.02 1.02  CALCIUM  8.7* 9.1 8.9  GFRNONAA >60 >60 >60  PROT 7.2 7.1 7.2  ALBUMIN 4.1 4.0 4.0  AST 25 29 25   ALT 23 20 21   ALKPHOS 106 89 97  BILITOT 0.4 0.7 0.7    RADIOGRAPHIC STUDIES: I have personally reviewed the radiological images as listed and agreed with the findings in the report. CT CHEST ABDOMEN PELVIS W CONTRAST Result Date: 06/01/2024 CLINICAL DATA:  Transverse colon cancer * Tracking Code: BO * EXAM: CT CHEST, ABDOMEN, AND PELVIS WITH CONTRAST TECHNIQUE: Multidetector CT imaging of the chest, abdomen and pelvis was performed following the standard protocol during bolus administration of intravenous contrast. RADIATION DOSE REDUCTION: This exam was performed according to the departmental dose-optimization program which includes automated exposure control, adjustment of the mA and/or kV according to patient size and/or use of iterative reconstruction technique. CONTRAST:  100mL ISOVUE-300 IOPAMIDOL (ISOVUE-300) INJECTION 61% additional oral enteric contrast COMPARISON:  11/03/2023 FINDINGS: CT  CHEST FINDINGS Cardiovascular: Right chest port catheter. Aortic atherosclerosis. Normal heart size. Left and right coronary artery calcifications. Gross enlargement of the main pulmonary artery measuring up to 4.3 cm in caliber. No pericardial effusion. Mediastinum/Nodes: No enlarged mediastinal, hilar, or axillary lymph nodes. Thyroid gland, trachea,  and esophagus demonstrate no significant findings. Lungs/Pleura: Diffuse bilateral bronchial wall thickening. Background of very tiny pulmonary nodules unchanged. No pleural effusion or pneumothorax. Musculoskeletal: No chest wall abnormality. No acute osseous findings. CT ABDOMEN PELVIS FINDINGS Hepatobiliary: No solid liver abnormality is seen. Gallstones. No gallbladder wall thickening, or biliary dilatation. Pancreas: Unremarkable. No pancreatic ductal dilatation or surrounding inflammatory changes. Spleen: Normal in size without significant abnormality. Adrenals/Urinary Tract: Adrenal glands are unremarkable. 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 No hydronephrosis. Nonobstructive calculus of the inferior pole of the left kidney. Jackstone calculus in the right aspect of the bladder measuring 1.3 cm as well as additional punctuate bladder calculi. Stomach/Bowel: Stomach is within normal limits. Appendix is diminutive or surgically absent. No evidence of bowel wall thickening, distention, or inflammatory changes. Status post abdominoperineal resection with left lower quadrant descending end colostomy. Unchanged fluid collection in the resection bed measuring 4.4 x 4.0 cm (series 2, image 106). Vascular/Lymphatic: Aortic atherosclerosis. No enlarged abdominal or pelvic lymph nodes. Reproductive: No mass or other abnormality. Other: Left lower quadrant end colostomy with parastomal hernia containing descending colon. Small fat containing bilateral inguinal hernias. No ascites. Musculoskeletal: No acute osseous findings. IMPRESSION: 1. Status post  abdominoperineal resection with left lower quadrant descending end colostomy. 2. Unchanged fluid collection in the resection bed measuring 4.4 x 4.0 cm. 3. No evidence of lymphadenopathy or metastatic disease in the chest, abdomen, or pelvis. 4. Diffuse bilateral bronchial wall thickening and background of very tiny pulmonary nodules unchanged, most commonly seen in smoking-related respiratory bronchiolitis. 5. Gross enlargement of the main pulmonary artery, as can be seen in pulmonary hypertension. 6. Cholelithiasis. 7. Nonobstructive left nephrolithiasis. 8. Jackstone calculus in the right aspect of the bladder measuring 1.3 cm as well as additional punctuate bladder calculi. 9. Coronary artery disease. Aortic Atherosclerosis (ICD10-I70.0). Electronically Signed   By: Marolyn JONETTA Jaksch M.D.   On: 06/01/2024 07:17     Cancer of transverse colon (HCC) #Transverse colon adenocarcinoma, Stage IIIC ([id number]), MMR proficient- s/p FOLFOX Oxaliplatin  permanently discontinued with cycle 8.  Completed 12 cycles of 5-FU on 01/27/2023.  #  CT chest abdomen pelvis reviewed  NOV 11th, 2025- Status post abdominoperineal resection with left lower quadrant descending end colostomy. Unchanged fluid collection in the resection bed measuring 4.4 x 4.0 cm. . No evidence of lymphadenopathy or metastatic disease in the chest, abdomen, or pelvis.  Discussed with surgery-no evidence of any concerns of progressive disease.  Recent tumor markers negative.  Discussed regarding CT DNA/molecular surveillance.  Patient interested.  Also discussed with Kristi Stanton.  Patient had procedural issues with colonoscopy with Dr. Tye.  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  Stable.  Refilled.  #History of colon cancer s/p left sided colostomy, chemo in 2011 -Surgery and had adjuvant chemotherapy.    # # BP poorly  controlled- Please check your blood pressure at home; if still elevated-reach out to PCP for further instructions.  Bring the log of blood pressures at next visit for our review.start pt on Norvsc 5 mg/day- then follow up with PCP.   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 [s/p evaluation] cardiology in the past.  Defer to PCP  # Access -port placed on 08/08/2022 by Dr. Tye.  Port maintenance with flushes every 6  weeks.  #Incidental findings on Imaging  CT , 2025: Cholelithiasis. Nonobstructive left nephrolithiasis; Jackstone calculus in the right aspect of the bladder measuring 1.3 cm as well as additional punctuate bladder calculi. Coronary artery disease I reviewed/discussed/counseled the patient.   # DISPOSITION: # repeat BP  # Port maintenance with flushes every 2 month x2  # follow up in 6  months- MD: labs- cbc/cmp; CEA- CT CAP prior-Dr.B   Above plan of care was discussed with patient/family in detail.  My contact information was given to the patient/family.       Cindy JONELLE Joe, MD 06/08/2024 2:12 PM

## 2024-06-08 NOTE — Telephone Encounter (Signed)
 Called pt to sched CT - left vm - LH

## 2024-06-09 ENCOUNTER — Telehealth: Payer: Self-pay | Admitting: Internal Medicine

## 2024-06-09 LAB — CEA: CEA: 2.6 ng/mL (ref 0.0–4.7)

## 2024-06-09 NOTE — Telephone Encounter (Signed)
 Called to sched CT - left vm requesting call back - LH

## 2024-06-10 ENCOUNTER — Telehealth: Payer: Self-pay | Admitting: Internal Medicine

## 2024-06-10 ENCOUNTER — Other Ambulatory Visit: Payer: Self-pay

## 2024-06-10 NOTE — Telephone Encounter (Signed)
 Called pt to sched - left vm asking for call back - LH

## 2024-06-11 ENCOUNTER — Encounter: Payer: Self-pay | Admitting: Internal Medicine

## 2024-06-14 DIAGNOSIS — D2310 Other benign neoplasm of skin of unspecified eyelid, including canthus: Secondary | ICD-10-CM | POA: Diagnosis not present

## 2024-06-14 DIAGNOSIS — H2513 Age-related nuclear cataract, bilateral: Secondary | ICD-10-CM | POA: Diagnosis not present

## 2024-06-14 DIAGNOSIS — H219 Unspecified disorder of iris and ciliary body: Secondary | ICD-10-CM | POA: Diagnosis not present

## 2024-06-14 DIAGNOSIS — H04132 Lacrimal cyst, left lacrimal gland: Secondary | ICD-10-CM | POA: Diagnosis not present

## 2024-06-24 ENCOUNTER — Other Ambulatory Visit: Payer: Self-pay

## 2024-06-30 ENCOUNTER — Other Ambulatory Visit: Payer: Self-pay | Admitting: Internal Medicine

## 2024-07-01 DIAGNOSIS — H5789 Other specified disorders of eye and adnexa: Secondary | ICD-10-CM | POA: Diagnosis not present

## 2024-07-01 DIAGNOSIS — H02834 Dermatochalasis of left upper eyelid: Secondary | ICD-10-CM | POA: Diagnosis not present

## 2024-07-01 DIAGNOSIS — H02831 Dermatochalasis of right upper eyelid: Secondary | ICD-10-CM | POA: Diagnosis not present

## 2024-07-13 LAB — SIGNATERA
SIGNATERA MTM READOUT: 0 MTM/ml
SIGNATERA TEST RESULT: NEGATIVE

## 2024-07-14 ENCOUNTER — Ambulatory Visit: Payer: Self-pay | Admitting: Internal Medicine

## 2024-08-09 ENCOUNTER — Inpatient Hospital Stay: Attending: Internal Medicine

## 2024-08-09 DIAGNOSIS — Z452 Encounter for adjustment and management of vascular access device: Secondary | ICD-10-CM | POA: Insufficient documentation

## 2024-08-09 DIAGNOSIS — C184 Malignant neoplasm of transverse colon: Secondary | ICD-10-CM | POA: Insufficient documentation

## 2024-10-06 ENCOUNTER — Inpatient Hospital Stay

## 2024-11-29 ENCOUNTER — Other Ambulatory Visit

## 2024-12-06 ENCOUNTER — Inpatient Hospital Stay

## 2024-12-06 ENCOUNTER — Inpatient Hospital Stay: Admitting: Internal Medicine
